# Patient Record
Sex: Female | Born: 1985
Health system: Southern US, Community
[De-identification: ages and names within clinical notes are randomized; demographics above are authoritative.]

## PROBLEM LIST (undated history)

## (undated) DIAGNOSIS — T7840XA Allergy, unspecified, initial encounter: Secondary | ICD-10-CM

## (undated) DIAGNOSIS — N200 Calculus of kidney: Secondary | ICD-10-CM

## (undated) DIAGNOSIS — J40 Bronchitis, not specified as acute or chronic: Secondary | ICD-10-CM

## (undated) DIAGNOSIS — K219 Gastro-esophageal reflux disease without esophagitis: Secondary | ICD-10-CM

## (undated) DIAGNOSIS — E559 Vitamin D deficiency, unspecified: Secondary | ICD-10-CM

## (undated) DIAGNOSIS — G43909 Migraine, unspecified, not intractable, without status migrainosus: Secondary | ICD-10-CM

## (undated) DIAGNOSIS — L661 Lichen planopilaris, unspecified: Secondary | ICD-10-CM

## (undated) DIAGNOSIS — R7303 Prediabetes: Secondary | ICD-10-CM

## (undated) DIAGNOSIS — Z87442 Personal history of urinary calculi: Secondary | ICD-10-CM

## (undated) DIAGNOSIS — E669 Obesity, unspecified: Secondary | ICD-10-CM

## (undated) DIAGNOSIS — I10 Essential (primary) hypertension: Secondary | ICD-10-CM

## (undated) DIAGNOSIS — G4730 Sleep apnea, unspecified: Secondary | ICD-10-CM

## (undated) DIAGNOSIS — J189 Pneumonia, unspecified organism: Secondary | ICD-10-CM

## (undated) DIAGNOSIS — K5792 Diverticulitis of intestine, part unspecified, without perforation or abscess without bleeding: Secondary | ICD-10-CM

## (undated) DIAGNOSIS — G4733 Obstructive sleep apnea (adult) (pediatric): Secondary | ICD-10-CM

## (undated) HISTORY — DX: Allergy, unspecified, initial encounter: T78.40XA

## (undated) HISTORY — DX: Prediabetes: R73.03

## (undated) HISTORY — DX: Migraine, unspecified, not intractable, without status migrainosus: G43.909

## (undated) HISTORY — DX: Calculus of kidney: N20.0

## (undated) HISTORY — DX: Obstructive sleep apnea (adult) (pediatric): G47.33

## (undated) HISTORY — DX: Diverticulitis of intestine, part unspecified, without perforation or abscess without bleeding: K57.92

## (undated) HISTORY — DX: Gastro-esophageal reflux disease without esophagitis: K21.9

## (undated) HISTORY — DX: Essential (primary) hypertension: I10

## (undated) HISTORY — DX: Lichen planopilaris, unspecified: L66.10

## (undated) HISTORY — DX: Lichen planopilaris: L66.1

## (undated) HISTORY — DX: Sleep apnea, unspecified: G47.30

## (undated) HISTORY — DX: Obesity, unspecified: E66.9

## (undated) HISTORY — DX: Vitamin D deficiency, unspecified: E55.9

---
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
## 2004-06-19 ENCOUNTER — Ambulatory Visit (HOSPITAL_COMMUNITY): Admission: RE | Admit: 2004-06-19 | Discharge: 2004-06-19 | Payer: Self-pay | Admitting: Obstetrics and Gynecology

## 2005-11-07 ENCOUNTER — Ambulatory Visit: Payer: Self-pay | Admitting: *Deleted

## 2005-11-07 ENCOUNTER — Inpatient Hospital Stay (HOSPITAL_COMMUNITY): Admission: AD | Admit: 2005-11-07 | Discharge: 2005-11-15 | Payer: Self-pay | Admitting: Gynecology

## 2005-11-09 ENCOUNTER — Ambulatory Visit: Payer: Self-pay | Admitting: Pediatrics

## 2005-11-12 ENCOUNTER — Encounter (INDEPENDENT_AMBULATORY_CARE_PROVIDER_SITE_OTHER): Payer: Self-pay | Admitting: *Deleted

## 2005-11-16 ENCOUNTER — Inpatient Hospital Stay (HOSPITAL_COMMUNITY): Admission: AD | Admit: 2005-11-16 | Discharge: 2005-11-16 | Payer: Self-pay | Admitting: Obstetrics and Gynecology

## 2006-10-01 ENCOUNTER — Emergency Department (HOSPITAL_COMMUNITY): Admission: EM | Admit: 2006-10-01 | Discharge: 2006-10-01 | Payer: Self-pay | Admitting: Emergency Medicine

## 2006-11-24 ENCOUNTER — Other Ambulatory Visit: Admission: RE | Admit: 2006-11-24 | Discharge: 2006-11-24 | Payer: Self-pay | Admitting: Obstetrics and Gynecology

## 2008-02-04 ENCOUNTER — Emergency Department (HOSPITAL_COMMUNITY): Admission: EM | Admit: 2008-02-04 | Discharge: 2008-02-04 | Payer: Self-pay | Admitting: Emergency Medicine

## 2008-08-23 ENCOUNTER — Other Ambulatory Visit: Admission: RE | Admit: 2008-08-23 | Discharge: 2008-08-23 | Payer: Self-pay | Admitting: Obstetrics and Gynecology

## 2009-11-07 ENCOUNTER — Other Ambulatory Visit: Admission: RE | Admit: 2009-11-07 | Discharge: 2009-11-07 | Payer: Self-pay | Admitting: Obstetrics and Gynecology

## 2010-02-08 ENCOUNTER — Emergency Department (HOSPITAL_COMMUNITY): Admission: EM | Admit: 2010-02-08 | Discharge: 2010-02-08 | Payer: Self-pay | Admitting: Family Medicine

## 2010-06-18 LAB — POCT RAPID STREP A (OFFICE): Streptococcus, Group A Screen (Direct): NEGATIVE

## 2010-06-24 ENCOUNTER — Emergency Department (HOSPITAL_COMMUNITY)
Admission: EM | Admit: 2010-06-24 | Discharge: 2010-06-24 | Disposition: A | Discharge status: Home / Self Care | Payer: 59 | Attending: Emergency Medicine | Admitting: Emergency Medicine

## 2010-06-24 DIAGNOSIS — R112 Nausea with vomiting, unspecified: Secondary | ICD-10-CM | POA: Insufficient documentation

## 2010-06-24 DIAGNOSIS — R197 Diarrhea, unspecified: Secondary | ICD-10-CM | POA: Insufficient documentation

## 2010-06-24 DIAGNOSIS — K5289 Other specified noninfective gastroenteritis and colitis: Secondary | ICD-10-CM | POA: Insufficient documentation

## 2010-08-23 NOTE — Op Note (Signed)
[place]. [facility]  Patient:    Debra Parker, Debra Parker Visit Number: [id number] MRN: [id number]          Service Type: DSU Location: [facility] Attending Physician:  Chapman Moss Dictated by:   Teena Irani. Odis Luster, M.D. Proc. Date: 03/26/01                             Operative Report  PREOPERATIVE DIAGNOSIS:  Significant iatrogenic scar of the upper abdomen secondary to a medical procedure.  POSTOPERATIVE DIAGNOSES: 1. Significant iatrogenic scar of the upper abdomen secondary to a medical    procedure. 2. Complicated open wound of the abdomen, 12.5 cm.  PROCEDURE:  Complex wound closure, abdomen, 12.5 cm.  SURGEON:  Teena Irani. Odis Luster, M.D.  ANESTHESIA:  General with topical 0.25% Marcaine with epinephrine.  CLINICAL NOTE:  A 26 year old woman had an exploratory of the subcutaneous tissues of her abdomen for a very large lesion.  This apparently ended up being benign, but she was left with a significant scar of her abdomen.  She presents today for a scar revision.  The nature of the procedure and the risks and the probability that she will still have some spreading of her scar has been discussed with her in detail, and she understood all of this and her parents understood all of this and wished to proceed.  DESCRIPTION OF PROCEDURE:  The patient was n the operating room and placed supine.  After successful induction of general anesthesia, she was prepped with Betadine and draped with sterile drapes.  Marcaine 0.25% was infiltrated around the scar and the scar was marked for excision, extending just to the left of the umbilicus in order to remove the entire area.  The excision was performed, meticulous hemostasis with electrocautery.  Wound edges undermined.  The wound irrigated thoroughly with saline.  Excellent hemostasis having been confirmed, a layered closure with 3-0 Vicryl interrupted inverted deep sutures, 3-0 Vicryl interrupted inverted deep  dermal sutures, and a running 3-0 Prolene subcuticular suture.  Steri-Strips, dry sterile dressing with an ABD pad and an abdominal binder placed over this were applied, and she tolerated the procedure well.  DISPOSITION:  The patient will leave all the dressings in place for two days and then may shower.  She will continue to use the abdominal binder.  We will recheck her in the office on January 2 or sooner if there are questions or concerns.  No lifting, no vigorous activities.  MEDICATIONS:  Darvocet-N 100 a total of 12 given, one p.o. q.6h. for pain; and Keflex 250 mg p.o. q.i.d. Dictated by:   Teena Irani. Odis Luster, M.D. Attending Physician:  Chapman Moss DD:  03/26/01 TD:  03/27/01 Job: [id number]

## 2010-08-23 NOTE — Op Note (Signed)
Debra Parker, Debra Parker             ACCOUNT NO.:  [id number]   MEDICAL RECORD NO.:  [id number]          PATIENT TYPE:  INP   LOCATION:  9372                          [facility]ILITY:  [facility]   PHYSICIAN:  Tracy L. Mayford Knife, M.D.DATE OF BIRTH:  06/18/1985   DATE OF PROCEDURE:  11/12/2005  DATE OF DISCHARGE:                                 OPERATIVE REPORT   PREOPERATIVE DIAGNOSES:  1. Severe preeclampsia defined by anuria.  2. Intrauterine pregnancy 24 weeks and five days.   POSTOPERATIVE DIAGNOSES:  1. Severe preeclampsia defined by anuria.  2. Intrauterine pregnancy 24 weeks and five days.   OPERATION/PROCEDURE:  Primary Classical cesarean section.   SURGEON:  Ginger Carne, M.D.   ASSISTANT:  Marc Morgans. Mayford Knife, M.D.   ESTIMATED BLOOD LOSS:  600 mg.   IV FLUIDS:  1600 mL lactated Ringer's.   URINARY OUTPUT:  45 mL of clear urine.   ANESTHESIA:  Spinal.   SPECIMENS:  Placenta to pathology and cord blood.   FINDINGS:  Preterm, viable female delivered via full, __________ .  Apgars 3  and 7.  Cord pH 7.18.  amniotic fluid was clear.  Placenta had a three-  vessel cord.  Normal uterus, tubes and ovaries.   DESCRIPTION OF PROCEDURE:  Spinal anesthesia was placed and found to be  adequate.  The patient was prepped and draped in the normal sterile fashion  in the dorsal supine position with a leftward tilt.  A Pfannenstiel skin  incision and the abdomen opened.  The uterine segment was incised quickly.  Placenta was found to be inferior and delivered simultaneously with delivery  of the infant which was done via full breech extraction.  Cord was clamped  and cut.  Baby was bulb suctioned and handed off to the awaiting  neonatologist.  Uterus was inspected and cleared of all  clot and debris.  The uterus was closed in a single layer using 0 Vicryl in a running locked  suture.  Bleeding points were hemostatically checked.  The peritoneum was  closed with 0 Vicryl in a running fashion  and 0  Vicryl was then used to close the fascia in a running fashion.  Instrument  and sponge counts were correct x2.  The patient did receive 2 g of Ancef at  cord clamp.  The patient tolerated the procedure well and was returned to  the recovery room in stable condition.           ______________________________  Marc Morgans Mayford Knife, M.D.     TLW/MEDQ  D:  11/12/2005  T:  11/13/2005  Job:  [id number]

## 2010-08-23 NOTE — Discharge Summary (Signed)
[name], Debra Parker NO.:  [id number]   MEDICAL RECORD NO.:  [id number]          PATIENT TYPE:  MAT   LOCATION:  MATC                          [facility]ILITY:  [facility]   PHYSICIAN:  Levander Campion, M.D.  DATE OF BIRTH:  Nov 19, 1985   DATE OF ADMISSION:  11/07/2005  DATE OF DISCHARGE:  11/15/2005                                 DISCHARGE SUMMARY   DISCHARGE DIAGNOSIS:  1. Preeclampsia secondary to anuria.  2. 24-5/7 weeks intrauterine pregnancy.  3. Delivery of viable preterm female at 24-5/7.  4. Primary low vertical cesarean section.   DISCHARGE MEDICATIONS:  1. Ibuprofen 600 mg p.o. q.6 h. p.r.n. pain.  2. Percocet 5/325, 1-2 tabs p.o. q.4-6h. p.r.n. pain.  3. Yasmin OCP.   FOLLOW UP:  The patient is to follow up in two days on Monday, August 13th  in the [facility] for staple removal.  The  patient is to follow up in one week with Dr. Emelda Fear at [facility]  for a blood pressure check and the patient is to follow up in 6 weeks with  Dr. Emelda Fear for a postpartum check.   DISCHARGE INSTRUCTIONS:  No heavy lifting and pelvic rest x6 weeks.   HOSPITAL COURSE:  Ms. Debra Parker is a 25 year old G1, P0 who presented at 85-  1/[redacted] weeks gestational age to be evaluated at [facility] for  hypertension.  Her blood pressure on admission was noted to be 160/90.  She  had no eclampsic symptoms.  She was also noted at [facility] to have  nonreassuring fetal heart tones.  She was transferred to [facility].  Pregnancy induced hypertension labs were drawn and were as follows:  A 24  hour urine protein was 6.337 gm, a uric acid was 7.9, H&H was 32.0/11.0,  platelets are 165,000, creatinine was 0.7, LFTs were within normal limits,  blood pressure at [facility] during this admission ranged from the  130s to 140s over 60s to 80s.  An ultrasound performed on August 3rd showed  a vertex fetus that was 505 gm.  The patient  continued to deny symptoms of  preeclampsia at [facility].  Patient was given 2 doses of  betamethasone.  Her course of betamethasone was complete on November 10, 2005.  The patient continued to have 1-2 minutes severe variable decelerations to  the 60s intermittently.  Maternal Fetal Medicine was consulted and  recommended b.i.d. BPPs, continuous external fetal heart rate monitoring and  daily PIH labs.  Her PIH labs were stable with the exception of  platelets  which decreased to a nadir of 122.  A repeat 24 hour urine showed greater  than 10 gm of protein.  The patient's blood pressure continued to be within  normal limits.  She continued to have severe variable D-cells  intermittently.  The risks and benefits of close monitoring versus C-section  to both the mother and the baby were discussed.  Also the NICU discussed the  possibility of delivery with the patient.  The patient became oliguric and  then anuric on November 12, 2005 and  a primary low vertical cesarean section  was performed secondary to severe preeclampsia and nonreassuring fetal heart  tones.  The patient delivered a viable female infant with Apgar's of 3 and  7.  The baby was taken to the NICU, please see the op note for details.  The  patient was on magnesium following her C-section for 24 hours.  Her blood  pressures postop were stable.  She had good urine output and no symptoms of  preeclampsia.  The baby was doing well in the NICU.  The patient is to  follow up with Dr. Emelda Fear in one week for a blood pressure check and 6  weeks for a postpartum check.  Patient is to have her staples out in 2 days  at the [facility] Admission.           ______________________________  Levander Campion, M.D.     JH/MEDQ  D:  11/15/2005  T:  11/17/2005  Job:  [id number]   cc:   Tilda Burrow, M.D.  Fax: [phone]

## 2011-01-06 LAB — CBC
Hemoglobin: 12.1
MCV: 85.4
RBC: 4.15

## 2011-01-06 LAB — BASIC METABOLIC PANEL
BUN: 13
CO2: 26
Chloride: 107
Creatinine, Ser: 0.55
GFR calc Af Amer: >60
Potassium: 4.1
Sodium: 136

## 2011-01-06 LAB — DIFFERENTIAL
Basophils Relative: 1
Eosinophils Absolute: 0.1
Monocytes Relative: 7

## 2011-01-06 LAB — SEDIMENTATION RATE: Sed Rate: 12

## 2011-01-22 LAB — POCT RAPID STREP A: Streptococcus, Group A Screen (Direct): POSITIVE — AB

## 2012-07-20 ENCOUNTER — Other Ambulatory Visit: Payer: Self-pay

## 2013-01-16 ENCOUNTER — Encounter (HOSPITAL_COMMUNITY): Payer: Self-pay | Admitting: Emergency Medicine

## 2013-01-16 ENCOUNTER — Emergency Department (HOSPITAL_COMMUNITY): Payer: PRIVATE HEALTH INSURANCE

## 2013-01-16 ENCOUNTER — Emergency Department (HOSPITAL_COMMUNITY)
Admission: EM | Admit: 2013-01-16 | Discharge: 2013-01-16 | Disposition: A | Discharge status: Home / Self Care | Payer: PRIVATE HEALTH INSURANCE | Attending: Emergency Medicine | Admitting: Emergency Medicine

## 2013-01-16 DIAGNOSIS — S93409A Sprain of unspecified ligament of unspecified ankle, initial encounter: Secondary | ICD-10-CM | POA: Insufficient documentation

## 2013-01-16 DIAGNOSIS — X500XXA Overexertion from strenuous movement or load, initial encounter: Secondary | ICD-10-CM | POA: Insufficient documentation

## 2013-01-16 DIAGNOSIS — S93402A Sprain of unspecified ligament of left ankle, initial encounter: Secondary | ICD-10-CM

## 2013-01-16 DIAGNOSIS — Y9301 Activity, walking, marching and hiking: Secondary | ICD-10-CM | POA: Insufficient documentation

## 2013-01-16 DIAGNOSIS — Z8709 Personal history of other diseases of the respiratory system: Secondary | ICD-10-CM | POA: Insufficient documentation

## 2013-01-16 DIAGNOSIS — Y929 Unspecified place or not applicable: Secondary | ICD-10-CM | POA: Insufficient documentation

## 2013-01-16 HISTORY — DX: Bronchitis, not specified as acute or chronic: J40

## 2013-01-16 MED ORDER — NAPROXEN 500 MG PO TABS: 500.0000 mg | ORAL_TABLET | Freq: Two times a day (BID) | ORAL | Status: DC

## 2013-01-16 NOTE — ED Provider Notes (Signed)
CSN: [id number]     Arrival date & time 01/16/13  1930 History  This chart was scribed for Rhea Bleacher, PA, working with Geoffery Lyons, MD by Blanchard Kelch, ED Scribe. This patient was seen in room TR08C/TR08C and the patient's care was started at 7:36 PM.    Chief Complaint  Patient presents with  . Foot Injury    The history is provided by the patient. No language interpreter was used.    HPI Comments: Debra Parker is a 27 y.o. female who presents to the Emergency Department due to a left foot injury that occurred this morning about twelve hours ago when she stepped in a hole and twisted her foot. She is complaining of constant pain to the associated area that began suddenly after the injury. The pain is worsened by walking. She wrapped it in an ace bandage and took ibuprofen with mild relief. She denies any pain in her left knee.    Past Medical History  Diagnosis Date  . Bronchitis    Past Surgical History  Procedure Laterality Date  . Cesarean section    . Wisdom tooth extraction     No family history on file. History  Substance Use Topics  . Smoking status: Never Smoker   . Smokeless tobacco: Not on file  . Alcohol Use: No   OB History   Grav Para Term Preterm Abortions TAB SAB Ect Mult Living                 Review of Systems  Constitutional: Negative for activity change.  Musculoskeletal: Positive for arthralgias and myalgias (left foot pain). Negative for back pain, joint swelling and neck pain.       Negative for left knee pain.  Skin: Negative for wound.  Neurological: Negative for weakness and numbness.    Allergies  Sulfa antibiotics  Home Medications   Current Outpatient Rx  Name  Route  Sig  Dispense  Refill  . phentermine 37.5 MG capsule   Oral   Take 37.5 mg by mouth every morning.          Triage Vitals: BP 117/77  Pulse 96  Temp(Src) 97.2 F (36.2 C) (Oral)  Resp 16  SpO2 96%  Physical Exam  Nursing note and vitals  reviewed. Constitutional: She appears well-developed and well-nourished.  HENT:  Head: Normocephalic and atraumatic.  Eyes: Conjunctivae are normal.  Neck: Normal range of motion. Neck supple.  Cardiovascular:  Pulses:      Dorsalis pedis pulses are 2+ on the right side, and 2+ on the left side.       Posterior tibial pulses are 2+ on the right side, and 2+ on the left side.  Musculoskeletal: She exhibits edema and tenderness.  Patient complains of pain with palpation of the lateral left ankle. She denies pain with palpation over the fibular head of the affected side. She denies pain in the hip of the affected side.  Neurological: She is alert.  Distal motor, sensation, and vascular intact.   Skin: Skin is warm and dry.  Psychiatric: She has a normal mood and affect.    ED Course  Procedures (including critical care time)  DIAGNOSTIC STUDIES: Oxygen Saturation is 96% on room air, adequate by my interpretation.    COORDINATION OF CARE: 7:47 PM -Will order left ankle x-ray. Patient verbalizes understanding and agrees with treatment plan.  Labs Review Labs Reviewed - No data to display Imaging Review Dg Ankle Complete Left  01/16/2013  CLINICAL DATA:  Left ankle pain and swelling after injury.  EXAM: LEFT ANKLE COMPLETE - 3+ VIEW  COMPARISON:  None.  FINDINGS: There is no evidence of fracture, dislocation, or joint effusion. There is no evidence of arthropathy or other focal bone abnormality. Soft tissues are unremarkable.  IMPRESSION: Normal left ankle.   Electronically Signed   By: Roque Lias M.D.   On: 01/16/2013 20:41    EKG Interpretation   None      Vital signs reviewed and are as follows: Filed Vitals:   01/16/13 1942  BP: 117/77  Pulse: 96  Temp: 97.2 F (36.2 C)  Resp: 16   Patient informed of x-ray results. ASO by orthopedic tech. Patient declines crutches.  Patient was counseled on RICE protocol and told to rest injury, use ice for no longer than 15  minutes every hour, compress the area, and elevate above the level of their heart as much as possible to reduce swelling.  Questions answered.  Patient verbalized understanding.     MDM   1. Ankle sprain, left, initial encounter    Patient with ankle injury, negative x-ray. Suspect ankle sprain. Rice precautions NSAIDs indicated with orthopedic followup if not improved in one week. Foot is neurovascularly intact.  I personally performed the services described in this documentation, which was scribed in my presence. The recorded information has been reviewed and is accurate.    Renne Crigler, PA-C 01/16/13 2352

## 2013-01-16 NOTE — Progress Notes (Signed)
Orthopedic Tech Progress Note Patient Details:  Debra Parker Feb 13, 1986 [id number]  Ortho Devices Type of Ortho Device: ASO Ortho Device/Splint Location: lle Ortho Device/Splint Interventions: Application   [name] 01/16/2013, 9:14 PM

## 2013-01-16 NOTE — ED Notes (Signed)
Pt st's she was walking this am and stepped in a hole.   C/O pain to lat. Aspect of left foot.  Swelling present

## 2013-01-18 NOTE — ED Provider Notes (Signed)
Medical screening examination/treatment/procedure(s) were performed by non-physician practitioner and as supervising physician I was immediately available for consultation/collaboration.  Geoffery Lyons, MD 01/18/13 [phone]

## 2013-04-18 ENCOUNTER — Other Ambulatory Visit (HOSPITAL_COMMUNITY): Payer: Self-pay | Admitting: Family Medicine

## 2013-04-18 ENCOUNTER — Ambulatory Visit (HOSPITAL_COMMUNITY)
Admission: RE | Admit: 2013-04-18 | Discharge: 2013-04-18 | Disposition: A | Discharge status: Home / Self Care | Payer: 59 | Source: Ambulatory Visit | Attending: Family Medicine | Admitting: Family Medicine

## 2013-04-18 DIAGNOSIS — R509 Fever, unspecified: Secondary | ICD-10-CM

## 2013-04-18 DIAGNOSIS — R05 Cough: Secondary | ICD-10-CM

## 2013-04-18 DIAGNOSIS — R0600 Dyspnea, unspecified: Secondary | ICD-10-CM

## 2013-04-18 DIAGNOSIS — R0989 Other specified symptoms and signs involving the circulatory and respiratory systems: Secondary | ICD-10-CM | POA: Insufficient documentation

## 2013-04-18 DIAGNOSIS — R059 Cough, unspecified: Secondary | ICD-10-CM

## 2014-01-06 ENCOUNTER — Telehealth: Payer: Self-pay | Admitting: Family Medicine

## 2014-01-06 NOTE — Telephone Encounter (Signed)
OK 

## 2014-01-06 NOTE — Telephone Encounter (Signed)
Pt's mom and dad are pt's of Dr Caryl NeverBurchette and would like for you to see their daughter. Can leave a message on moms cell.

## 2014-01-10 ENCOUNTER — Ambulatory Visit (HOSPITAL_COMMUNITY)
Admission: RE | Admit: 2014-01-10 | Discharge: 2014-01-10 | Disposition: A | Discharge status: Home / Self Care | Payer: 59 | Source: Ambulatory Visit | Attending: Family Medicine | Admitting: Family Medicine

## 2014-01-10 ENCOUNTER — Other Ambulatory Visit (HOSPITAL_COMMUNITY): Payer: Self-pay | Admitting: Family Medicine

## 2014-01-10 DIAGNOSIS — R05 Cough: Secondary | ICD-10-CM

## 2014-01-10 DIAGNOSIS — R059 Cough, unspecified: Secondary | ICD-10-CM

## 2014-11-29 ENCOUNTER — Emergency Department (HOSPITAL_COMMUNITY)
Admission: EM | Admit: 2014-11-29 | Discharge: 2014-11-29 | Disposition: A | Discharge status: Home / Self Care | Payer: BLUE CROSS/BLUE SHIELD | Attending: Emergency Medicine | Admitting: Emergency Medicine

## 2014-11-29 ENCOUNTER — Encounter (HOSPITAL_COMMUNITY): Payer: Self-pay | Admitting: Emergency Medicine

## 2014-11-29 DIAGNOSIS — R21 Rash and other nonspecific skin eruption: Secondary | ICD-10-CM | POA: Diagnosis present

## 2014-11-29 DIAGNOSIS — Z791 Long term (current) use of non-steroidal anti-inflammatories (NSAID): Secondary | ICD-10-CM | POA: Diagnosis not present

## 2014-11-29 DIAGNOSIS — Z8709 Personal history of other diseases of the respiratory system: Secondary | ICD-10-CM | POA: Insufficient documentation

## 2014-11-29 DIAGNOSIS — L739 Follicular disorder, unspecified: Secondary | ICD-10-CM

## 2014-11-29 MED ORDER — PREDNISONE 50 MG PO TABS
60.0000 mg | ORAL_TABLET | Freq: Once | ORAL | Status: AC
  Administered 2014-11-29: 60 mg via ORAL
  Filled 2014-11-29 (×2): qty 1

## 2014-11-29 MED ORDER — HYDROXYZINE HCL 25 MG PO TABS
25.0000 mg | ORAL_TABLET | Freq: Once | ORAL | Status: AC
  Administered 2014-11-29: 25 mg via ORAL
  Filled 2014-11-29: qty 1

## 2014-11-29 MED ORDER — ERYTHROMYCIN 2 % EX OINT: TOPICAL_OINTMENT | CUTANEOUS | Status: DC

## 2014-11-29 MED ORDER — CEPHALEXIN 500 MG PO CAPS
500.0000 mg | ORAL_CAPSULE | Freq: Once | ORAL | Status: AC
  Administered 2014-11-29: 500 mg via ORAL
  Filled 2014-11-29: qty 1

## 2014-11-29 MED ORDER — CEPHALEXIN 500 MG PO CAPS: 500.0000 mg | ORAL_CAPSULE | Freq: Four times a day (QID) | ORAL | Status: DC

## 2014-11-29 MED ORDER — HYDROXYZINE HCL 25 MG PO TABS: ORAL_TABLET | ORAL | Status: DC

## 2014-11-29 NOTE — Discharge Instructions (Signed)
Your vital signs are well within normal limits. Your examination suggest folliculitis. Please apply erythromycin ointment to the area 2 times daily over the next 7-10 days. Please use Keflex 4 times daily with food until all taken. May use Vistaril every 6 hours if needed for itching. This medication may cause drowsiness, please do not drive, drink alcohol, frequency and agree, or participate in activities requiring concentration when taking this medication. Folliculitis  Folliculitis is redness, soreness, and swelling (inflammation) of the hair follicles. This condition can occur anywhere on the body. People with weakened immune systems, diabetes, or obesity have a greater risk of getting folliculitis. CAUSES  Bacterial infection. This is the most common cause.  Fungal infection.  Viral infection.  Contact with certain chemicals, especially oils and tars. Long-term folliculitis can result from bacteria that live in the nostrils. The bacteria may trigger multiple outbreaks of folliculitis over time. SYMPTOMS Folliculitis most commonly occurs on the scalp, thighs, legs, back, buttocks, and areas where hair is shaved frequently. An early sign of folliculitis is a small, white or yellow, pus-filled, itchy lesion (pustule). These lesions appear on a red, inflamed follicle. They are usually less than 0.2 inches (5 mm) wide. When there is an infection of the follicle that goes deeper, it becomes a boil or furuncle. A group of closely packed boils creates a larger lesion (carbuncle). Carbuncles tend to occur in hairy, sweaty areas of the body. DIAGNOSIS  Your caregiver can usually tell what is wrong by doing a physical exam. A sample may be taken from one of the lesions and tested in a lab. This can help determine what is causing your folliculitis. TREATMENT  Treatment may include:  Applying warm compresses to the affected areas.  Taking antibiotic medicines orally or applying them to the  skin.  Draining the lesions if they contain a large amount of pus or fluid.  Laser hair removal for cases of long-lasting folliculitis. This helps to prevent regrowth of the hair. HOME CARE INSTRUCTIONS  Apply warm compresses to the affected areas as directed by your caregiver.  If antibiotics are prescribed, take them as directed. Finish them even if you start to feel better.  You may take over-the-counter medicines to relieve itching.  Do not shave irritated skin.  Follow up with your caregiver as directed. SEEK IMMEDIATE MEDICAL CARE IF:   You have increasing redness, swelling, or pain in the affected area.  You have a fever. MAKE SURE YOU:  Understand these instructions.  Will watch your condition.  Will get help right away if you are not doing well or get worse. Document Released: 06/02/2001 Document Revised: 09/23/2011 Document Reviewed: 06/24/2011 [facility] Patient Information 2015 [place], [place]. This information is not intended to replace advice given to you by your health care provider. Make sure you discuss any questions you have with your health care provider.

## 2014-11-29 NOTE — ED Notes (Signed)
Pt reports bilateral calf rash since Thursday. Pt reports just returned from [place] on Saturday.  Pt reports has tried otc products with no relief. Pt denies any new symptoms. nad noted. Moderate rash noted to both legs. Airway patent.

## 2014-11-29 NOTE — ED Provider Notes (Signed)
CSN: [id number]     Arrival date & time 11/29/14  1709 History   First MD Initiated Contact with Patient 11/29/14 1833     Chief Complaint  Patient presents with  . Rash     (Consider location/radiation/quality/duration/timing/severity/associated sxs/prior Treatment) HPI Comments: Patient is a 29 year old female who presents to the emergency department with a rash on the calf and lower leg. The patient states that she was in [place] about one week ago when she began to notice a rash on the calf area. Problem.progressively worse. And upon her return here in [place] she notes that the conservative measures that she's been trying have not been effective in relieving this rash. She states that the area was warm to touch on Friday or Saturday, but is not warm now. She does state that she has a lot of itching of the area. She is also concerned because she sustained an insect bite that she believes to be a mosquito while in the [place] area. She has not had high fever, but has been no vomiting reported. She has not had this rash before. She denies any new soaps, new clothing, new lotions. She does report that she shaved her legs frequently while she was in the [place] area.  Patient is a 29 y.o. female presenting with rash. The history is provided by the patient.  Rash   Past Medical History  Diagnosis Date  . Bronchitis    Past Surgical History  Procedure Laterality Date  . Cesarean section    . Wisdom tooth extraction     History reviewed. No pertinent family history. Social History  Substance Use Topics  . Smoking status: Never Smoker   . Smokeless tobacco: None  . Alcohol Use: No   OB History    No data available     Review of Systems  Skin: Positive for rash.  All other systems reviewed and are negative.     Allergies  Sulfa antibiotics  Home Medications   Prior to Admission medications   Medication Sig Start Date End Date Taking? Authorizing Provider  naproxen  (NAPROSYN) 500 MG tablet Take 1 tablet (500 mg total) by mouth 2 (two) times daily. 01/16/13   Renne Crigler, PA-C  phentermine 37.5 MG capsule Take 37.5 mg by mouth every morning.    Historical Provider, MD   BP 133/80 mmHg  Pulse 85  Temp(Src) 97.9 F (36.6 C) (Oral)  Resp 18  Ht 5\' 6"  (1.676 m)  Wt 200 lb (90.719 kg)  BMI 32.30 kg/m2  SpO2 100% Physical Exam  Constitutional: She is oriented to person, place, and time. She appears well-developed and well-nourished.  Non-toxic appearance.  HENT:  Head: Normocephalic.  Right Ear: Tympanic membrane and external ear normal.  Left Ear: Tympanic membrane and external ear normal.  Eyes: EOM and lids are normal. Pupils are equal, round, and reactive to light.  Neck: Normal range of motion. Neck supple. Carotid bruit is not present.  Cardiovascular: Normal rate, regular rhythm, normal heart sounds, intact distal pulses and normal pulses.   Pulmonary/Chest: Breath sounds normal. No respiratory distress. She has no wheezes. She has no rales.  Patient speaks in complete sentences without problem.  Abdominal: Soft. Bowel sounds are normal. There is no tenderness. There is no guarding.  Musculoskeletal: Normal range of motion.  Lymphadenopathy:       Head (right side): No submandibular adenopathy present.       Head (left side): No submandibular adenopathy present.  She has no cervical adenopathy.  Neurological: She is alert and oriented to person, place, and time. She has normal strength. No cranial nerve deficit or sensory deficit.  Skin: Skin is warm and dry.  There is a red macular rash from the mid calf to the ankle bilaterally. The area is not hot. There no red streaks appreciated. There is no weeping noted on. There no pustules appreciated. There is a small area on the right thigh per the patient, but no other rash area appreciated.  Psychiatric: She has a normal mood and affect. Her speech is normal.  Nursing note and vitals  reviewed.   ED Course  Procedures (including critical care time) Labs Review Labs Reviewed - No data to display  Imaging Review No results found. I have personally reviewed and evaluated these images and lab results as part of my medical decision-making.   EKG Interpretation None      MDM  Vital signs are well within normal limits. The rash and the history are consistent with a folliculitis. The patient will be treated with erythromycin liquid, and Keflex orally. The patient will be treated with a short course of steroid-induced to assist with the itching.    Final diagnoses:  None    *I have reviewed nursing notes, vital signs, and all appropriate lab and imaging results for this patient.[address], PA-C 11/29/14 1858  Rolland Porter, MD 12/03/14 [phone]

## 2015-09-11 ENCOUNTER — Encounter: Payer: Self-pay | Admitting: Orthopaedic Surgery

## 2015-09-11 ENCOUNTER — Ambulatory Visit (INDEPENDENT_AMBULATORY_CARE_PROVIDER_SITE_OTHER): Payer: PRIVATE HEALTH INSURANCE | Admitting: Orthopaedic Surgery

## 2015-09-11 VITALS — BP 108/67 | HR 78 | Temp 98.1°F | Ht 65.0 in | Wt 259.0 lb

## 2015-09-11 DIAGNOSIS — M7711 Lateral epicondylitis, right elbow: Secondary | ICD-10-CM

## 2015-09-11 MED ORDER — NAPROXEN 500 MG PO TABS: 500.0000 mg | ORAL_TABLET | Freq: Two times a day (BID) | ORAL | Status: DC

## 2015-09-11 NOTE — Progress Notes (Signed)
   Subjective: My right elbow hurts    Patient ID: Debra RamusHeather M Anspach, female    DOB: 05-Sep-1985, 30 y.o.   MRN: [id number]  HPI She has had right lateral elbow pain for over a month.  It hurts to lift and hold anything.  She has no redness, no swelling, no trauma, no numbness.  It is getting worse. She has used heat and bought an elbow brace which has not helped.  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  She is right hand dominant.   Review of Systems  HENT: Negative for congestion.   Respiratory: Negative for cough and shortness of breath.   Cardiovascular: Negative for chest pain and leg swelling.  Endocrine: Negative for cold intolerance.  Musculoskeletal: Positive for arthralgias.  Allergic/Immunologic: Negative for environmental allergies.   Past Medical History  Diagnosis Date  . Bronchitis     Past Surgical History  Procedure Laterality Date  . Cesarean section    . Wisdom tooth extraction      No current outpatient prescriptions on file prior to visit.   No current facility-administered medications on file prior to visit.    Social History   Social History  . Marital Status: Single    Spouse Name: N/A  . Number of Children: N/A  . Years of Education: N/A   Occupational History  . Not on file.   Social History Main Topics  . Smoking status: Never Smoker   . Smokeless tobacco: Not on file  . Alcohol Use: No  . Drug Use: No  . Sexual Activity: Yes    Birth Control/ Protection: IUD   Other Topics Concern  . Not on file   Social History Narrative    BP 108/67 mmHg  Pulse 78  Temp(Src) 98.1 F (36.7 C)  Ht 5\' 5"  (1.651 m)  Wt 259 lb (117.482 kg)  BMI 43.10 kg/m2     Objective:   Physical Exam  Constitutional: She is oriented to person, place, and time. She appears well-developed and well-nourished.  HENT:  Head: Normocephalic and atraumatic.  Eyes: Conjunctivae and EOM are normal. Pupils are equal, round, and reactive to  light.  Neck: Normal range of motion. Neck supple.  Cardiovascular: Normal rate, regular rhythm and intact distal pulses.   Pulmonary/Chest: Effort normal.  Abdominal: Soft.  Musculoskeletal: She exhibits tenderness (She is very tender over the right lateral epicondyle, No redness, no swelling, NV intact.  Pain to resisted dorsiextension of the wrist.  Left side negative.).  Neurological: She is alert and oriented to person, place, and time. She displays normal reflexes. No cranial nerve deficit. She exhibits normal muscle tone. Coordination normal.  Skin: Skin is warm and dry.  Psychiatric: She has a normal mood and affect. Her behavior is normal. Judgment and thought content normal.          Assessment & Plan:   Encounter Diagnosis  Name Primary?  . Lateral epicondylitis, right Yes    I explained ice massage and how to do.  I explained what tennis elbow is.  It is easy to get and hard to get rid of.  I have given Rx for Naprosyn, precautions given.  Return in two weeks.  Call if any problem.  Electronically Signed Darreld McleanWayne Rayette Mogg, MD 6/6/20172:05 PM

## 2015-09-11 NOTE — Patient Instructions (Signed)
Ice massage instructions given.

## 2015-09-25 ENCOUNTER — Ambulatory Visit: Payer: Self-pay | Admitting: Orthopaedic Surgery

## 2015-09-26 ENCOUNTER — Ambulatory Visit (INDEPENDENT_AMBULATORY_CARE_PROVIDER_SITE_OTHER): Payer: PRIVATE HEALTH INSURANCE | Admitting: Orthopaedic Surgery

## 2015-09-26 ENCOUNTER — Encounter: Payer: Self-pay | Admitting: Orthopaedic Surgery

## 2015-09-26 VITALS — BP 108/74 | HR 99 | Temp 97.9°F | Ht 65.0 in | Wt 257.8 lb

## 2015-09-26 DIAGNOSIS — M7711 Lateral epicondylitis, right elbow: Secondary | ICD-10-CM | POA: Diagnosis not present

## 2015-09-26 NOTE — Progress Notes (Signed)
CC:  My elbow hurts more  She has lateral epicondylitis of the right elbow. She has been doing ice massage and taking the Naprosyn. She continues to have pain.  She has tenderness over the lateral epicondyle right with no redness or swelling.  She has pain to resisted dorsiextension.  Encounter Diagnosis  Name Primary?  . Lateral epicondylitis, right Yes    PROCEDURE NOTE  After prep and permission from the patient, the right epicondyle area was injected with 1% Xylocaine and 1 cc DepoMedrol 40 by sterile technique tolerated well.   I will see her in one month.  Precautions given.  Call if any problem.  Continue ice massage and naprosyn.  Electronically Signed Darreld McleanWayne Yuepheng Schaller, MD 6/21/20173:07 PM

## 2015-10-17 ENCOUNTER — Ambulatory Visit: Payer: Self-pay | Admitting: Orthopaedic Surgery

## 2016-04-15 ENCOUNTER — Encounter: Payer: Self-pay | Admitting: Orthopaedic Surgery

## 2016-04-15 ENCOUNTER — Ambulatory Visit (INDEPENDENT_AMBULATORY_CARE_PROVIDER_SITE_OTHER): Payer: PRIVATE HEALTH INSURANCE

## 2016-04-15 ENCOUNTER — Ambulatory Visit (INDEPENDENT_AMBULATORY_CARE_PROVIDER_SITE_OTHER): Payer: PRIVATE HEALTH INSURANCE | Admitting: Orthopaedic Surgery

## 2016-04-15 VITALS — BP 109/63 | HR 83 | Temp 98.1°F | Ht 65.0 in | Wt 268.0 lb

## 2016-04-15 DIAGNOSIS — M25562 Pain in left knee: Secondary | ICD-10-CM | POA: Diagnosis not present

## 2016-04-15 MED ORDER — HYDROCODONE-ACETAMINOPHEN 7.5-325 MG PO TABS: ORAL_TABLET | ORAL | 0 refills | Status: DC

## 2016-04-15 MED ORDER — NAPROXEN 500 MG PO TABS: 500.0000 mg | ORAL_TABLET | Freq: Two times a day (BID) | ORAL | 5 refills | Status: DC

## 2016-04-15 NOTE — Patient Instructions (Signed)

## 2016-04-15 NOTE — Progress Notes (Signed)
Patient Debra Parker, female DOB:1985-09-25, 31 y.o. JXB:[id number]  Chief Complaint  Patient presents with  . new problem    Left knee pain    HPI  Debra Parker is a 31 y.o. female who has developed acute pain of the left knee over the last week. She has has some pain at times of the left ankle but she has no new trauma.  She has swelling and popping of the left knee. She has pain with full extension. She has no locking or giving way.  She has tried heat, ice, Advil with little help.  She has no redness or numbness. HPI  Body mass index is 44.6 kg/m.  ROS  Review of Systems  HENT: Negative for congestion.   Respiratory: Negative for cough and shortness of breath.   Cardiovascular: Negative for chest pain and leg swelling.  Endocrine: Negative for cold intolerance.  Musculoskeletal: Positive for arthralgias.  Allergic/Immunologic: Negative for environmental allergies.    Past Medical History:  Diagnosis Date  . Bronchitis     Past Surgical History:  Procedure Laterality Date  . CESAREAN SECTION    . WISDOM TOOTH EXTRACTION      History reviewed. No pertinent family history.  Social History Social History  Substance Use Topics  . Smoking status: Never Smoker  . Smokeless tobacco: Never Used  . Alcohol use No    Allergies  Allergen Reactions  . Sulfa Antibiotics Other (See Comments)    Not sure.    Current Outpatient Prescriptions  Medication Sig Dispense Refill  . HYDROcodone-acetaminophen (NORCO) 7.5-325 MG tablet One tablet every four hours as needed for pain.  Five day supply for acute pain per [facility]. 30 tablet 0  . naproxen (NAPROSYN) 500 MG tablet Take 1 tablet (500 mg total) by mouth 2 (two) times daily with a meal. 60 tablet 5   No current facility-administered medications for this visit.      Physical Exam  Blood pressure 109/63, pulse 83, temperature 98.1 F (36.7 C), height 5\' 5"  (1.651 m), weight 268 lb (121.6  kg).  Constitutional: overall normal hygiene, normal nutrition, well developed, normal grooming, normal body habitus. Assistive device:none  Musculoskeletal: gait and station Limp left, muscle tone and strength are normal, no tremors or atrophy is present.  .  Neurological: coordination overall normal.  Deep tendon reflex/nerve stretch intact.  Sensation normal.  Cranial nerves II-XII intact.   Skin:   Normal overall no scars, lesions, ulcers or rashes. No psoriasis.  Psychiatric: Alert and oriented x 3.  Recent memory intact, remote memory unclear.  Normal mood and affect. Well groomed.  Good eye contact.  Cardiovascular: overall no swelling, no varicosities, no edema bilaterally, normal temperatures of the legs and arms, no clubbing, cyanosis and good capillary refill.  Lymphatic: palpation is normal.  The left lower extremity is examined:  Inspection:  Thigh:  Non-tender and no defects  Knee has swelling 2+ effusion.                        Joint tenderness is present                        Patient is tender over the medial joint line  Lower Leg:  Has normal appearance and no tenderness or defects  Ankle:  Non-tender and no defects  Foot:  Non-tender and no defects Range of Motion:  Knee:  Range of motion is:  5 to 100                        Crepitus is  present  Ankle:  Range of motion is normal. Strength and Tone:  The left lower extremity has normal strength and tone. Stability:  Knee:  The knee is stable.  Ankle:  The ankle is stable.  X-rays of the left knee were done, reported separately.  The patient has been educated about the nature of the problem(s) and counseled on treatment options.  The patient appeared to understand what I have discussed and is in agreement with it.  Encounter Diagnosis  Name Primary?  . Acute pain of left knee Yes   PROCEDURE NOTE:  The patient request injection, verbal consent was obtained.  The left knee was prepped appropriately  after time out was performed.   Sterile technique was observed and anesthesia was provided by ethyl chloride and a 20-gauge needle was used to inject the knee area.  A 16-gauge needle was then used to aspirate the knee.  Color of fluid aspirated was blood tinged  Total cc's aspirated was 20.    Injection of 1 cc of Depo-Medrol 40 mg with several cc's of plain xylocaine was then performed.  A band aid dressing was applied.  The patient was advised to apply ice later today and tomorrow to the injection sight as needed.  PLAN Call if any problems.  Precautions discussed.  Continue current medications.   Return to clinic 1 week   I have given five day supply of Norco 7.5 after reviewing the 5230-month state narcotic site.  Electronically Signed Darreld McleanWayne Elkin Belfield, MD 1/9/20189:08 AM

## 2016-04-17 ENCOUNTER — Telehealth: Payer: Self-pay | Admitting: Orthopaedic Surgery

## 2016-04-17 NOTE — Telephone Encounter (Signed)
ok 

## 2016-04-17 NOTE — Telephone Encounter (Signed)
Patient saw Dr. Hilda LiasKeeling on Tuesday, 04-15-16.  He drew fluid from her knee at that time.  She states her knee has swollen back up and is painful.  She is using ice.  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  She wants to know if we can give her an out of work note until she returns on the 18th?

## 2016-04-18 ENCOUNTER — Encounter: Payer: Self-pay | Admitting: Orthopedic Surgery

## 2016-04-23 ENCOUNTER — Encounter: Payer: Self-pay | Admitting: Orthopaedic Surgery

## 2016-04-24 ENCOUNTER — Ambulatory Visit: Payer: Self-pay | Admitting: Orthopaedic Surgery

## 2016-04-29 ENCOUNTER — Encounter: Payer: Self-pay | Admitting: Orthopaedic Surgery

## 2016-04-29 ENCOUNTER — Ambulatory Visit (INDEPENDENT_AMBULATORY_CARE_PROVIDER_SITE_OTHER): Payer: PRIVATE HEALTH INSURANCE | Admitting: Orthopaedic Surgery

## 2016-04-29 VITALS — BP 113/68 | HR 70 | Ht 65.0 in | Wt 268.0 lb

## 2016-04-29 DIAGNOSIS — M25562 Pain in left knee: Secondary | ICD-10-CM | POA: Diagnosis not present

## 2016-04-29 NOTE — Patient Instructions (Signed)
Out of work 

## 2016-04-29 NOTE — Progress Notes (Signed)
Patient ZO:XWRUEAV Debra Parker, female DOB:19-Oct-1985, 31 y.o. [id number]:[id number]  Chief Complaint  Patient presents with  . Knee Pain    left    HPI  Debra Parker is a 31 y.o. female who has worsening pain of the left knee now.  She cannot fully extend it.  She has more swelling and pain and popping.  She has medial joint line pain and also giving way.  She hurts all the time she says. She has no redness. She has taken the medicine and used ice with help.   I would like to get a MRI as she has more pain, inability to fully extend, positive medial McMurray and giving way.  I am concerned she has a medial meniscus tear on the left and will need arthroscopy. HPI  Body mass index is 44.6 kg/m.  ROS  Review of Systems  HENT: Negative for congestion.   Respiratory: Negative for cough and shortness of breath.   Cardiovascular: Negative for chest pain and leg swelling.  Endocrine: Negative for cold intolerance.  Musculoskeletal: Positive for arthralgias.  Allergic/Immunologic: Negative for environmental allergies.    Past Medical History:  Diagnosis Date  . Bronchitis     Past Surgical History:  Procedure Laterality Date  . CESAREAN SECTION    . WISDOM TOOTH EXTRACTION      No family history on file.  Social History Social History  Substance Use Topics  . Smoking status: Never Smoker  . Smokeless tobacco: Never Used  . Alcohol use No    Allergies  Allergen Reactions  . Sulfa Antibiotics Other (See Comments)    Not sure.    Current Outpatient Prescriptions  Medication Sig Dispense Refill  . HYDROcodone-acetaminophen (NORCO) 7.5-325 MG tablet One tablet every four hours as needed for pain.  Five day supply for acute pain per [facility]. 30 tablet 0  . naproxen (NAPROSYN) 500 MG tablet Take 1 tablet (500 mg total) by mouth 2 (two) times daily with a meal. 60 tablet 5   No current facility-administered medications for this visit.      Physical Exam  Blood  pressure 113/68, pulse 70, height 5\' 5"  (1.651 m), weight 268 lb (121.6 kg).  Constitutional: overall normal hygiene, normal nutrition, well developed, normal grooming, normal body habitus. Assistive device:none  Musculoskeletal: gait and station Limp left, muscle tone and strength are normal, no tremors or atrophy is present.  .  Neurological: coordination overall normal.  Deep tendon reflex/nerve stretch intact.  Sensation normal.  Cranial nerves II-XII intact.   Skin:   Normal overall no scars, lesions, ulcers or rashes. No psoriasis.  Psychiatric: Alert and oriented x 3.  Recent memory intact, remote memory unclear.  Normal mood and affect. Well groomed.  Good eye contact.  Cardiovascular: overall no swelling, no varicosities, no edema bilaterally, normal temperatures of the legs and arms, no clubbing, cyanosis and good capillary refill.  Lymphatic: palpation is normal.  The left lower extremity is examined:  Inspection:  Thigh:  Non-tender and no defects  Knee has swelling 2+ effusion.                        Joint tenderness is present                        Patient is tender over the medial joint line  Lower Leg:  Has normal appearance and no tenderness or defects  Ankle:  Non-tender  and no defects  Foot:  Non-tender and no defects Range of Motion:  Knee:  Range of motion is: 5 to 90 with pain, unable to be able to fully extend the knee.                        Crepitus is  present  Ankle:  Range of motion is normal. Strength and Tone:  The left lower extremity has normal strength and tone. Stability:  Knee:  The knee has positive medial McMurray.  Ankle:  The ankle is stable.    The patient has been educated about the nature of the problem(s) and counseled on treatment options.  The patient appeared to understand what I have discussed and is in agreement with it.  Encounter Diagnosis  Name Primary?  . Acute pain of left knee Yes    PLAN Call if any problems.   Precautions discussed.  Continue current medications.   Return to clinic get MRI of the left knee.   Electronically Signed Darreld McleanWayne Cace Osorto, MD 1/23/20184:48 PM

## 2016-04-30 ENCOUNTER — Ambulatory Visit: Payer: Self-pay | Admitting: Orthopaedic Surgery

## 2016-05-02 ENCOUNTER — Ambulatory Visit (HOSPITAL_COMMUNITY)
Admission: RE | Admit: 2016-05-02 | Discharge: 2016-05-02 | Disposition: A | Discharge status: Home / Self Care | Payer: PRIVATE HEALTH INSURANCE | Source: Ambulatory Visit | Attending: Orthopaedic Surgery | Admitting: Orthopaedic Surgery

## 2016-05-02 DIAGNOSIS — S83282A Other tear of lateral meniscus, current injury, left knee, initial encounter: Secondary | ICD-10-CM | POA: Diagnosis not present

## 2016-05-02 DIAGNOSIS — X58XXXA Exposure to other specified factors, initial encounter: Secondary | ICD-10-CM | POA: Diagnosis not present

## 2016-05-02 DIAGNOSIS — M25562 Pain in left knee: Secondary | ICD-10-CM

## 2016-05-02 DIAGNOSIS — S80222A Blister (nonthermal), left knee, initial encounter: Secondary | ICD-10-CM | POA: Diagnosis not present

## 2016-05-06 ENCOUNTER — Ambulatory Visit (INDEPENDENT_AMBULATORY_CARE_PROVIDER_SITE_OTHER): Payer: PRIVATE HEALTH INSURANCE | Admitting: Orthopaedic Surgery

## 2016-05-06 ENCOUNTER — Encounter: Payer: Self-pay | Admitting: Orthopaedic Surgery

## 2016-05-06 VITALS — BP 132/88 | HR 75 | Temp 97.7°F | Ht 65.0 in | Wt 266.0 lb

## 2016-05-06 DIAGNOSIS — M25562 Pain in left knee: Secondary | ICD-10-CM

## 2016-05-06 NOTE — Progress Notes (Signed)
Patient ON:Debra Parker VIA ROSADO, female DOB:1985/12/10, 31 y.o. [id number]:[id number]  Chief Complaint  Patient presents with  . Results    MRI Left knee    HPI  Debra Parker is a 31 y.o. female who has had acute pain of the left knee. She had MRI done 05-02-16 showing: IMPRESSION: Tiny inferior articular surfacing tear involving the anteromedial corner of the lateral meniscus.  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  Intact cruciate and collateral ligaments.  I have explained the findings to her.  No surgery is needed at this time. Continue her medicine. HPI  Body mass index is 44.26 kg/m.  ROS  Review of Systems  HENT: Negative for congestion.   Respiratory: Negative for cough and shortness of breath.   Cardiovascular: Negative for chest pain and leg swelling.  Endocrine: Negative for cold intolerance.  Musculoskeletal: Positive for arthralgias.  Allergic/Immunologic: Negative for environmental allergies.    Past Medical History:  Diagnosis Date  . Bronchitis     Past Surgical History:  Procedure Laterality Date  . CESAREAN SECTION    . WISDOM TOOTH EXTRACTION      No family history on file.  Social History Social History  Substance Use Topics  . Smoking status: Never Smoker  . Smokeless tobacco: Never Used  . Alcohol use No    Allergies  Allergen Reactions  . Sulfa Antibiotics Other (See Comments)    Not sure.    Current Outpatient Prescriptions  Medication Sig Dispense Refill  . HYDROcodone-acetaminophen (NORCO) 7.5-325 MG tablet One tablet every four hours as needed for pain.  Five day supply for acute pain per [facility]. 30 tablet 0  . naproxen (NAPROSYN) 500 MG tablet Take 1 tablet (500 mg total) by mouth 2 (two) times daily with a meal. 60 tablet 5   No current facility-administered  medications for this visit.      Physical Exam  Blood pressure 132/88, pulse 75, temperature 97.7 F (36.5 C), height 5\' 5"  (1.651 m), weight 266 lb (120.7 kg).  Constitutional: overall normal hygiene, normal nutrition, well developed, normal grooming, normal body habitus. Assistive device:none  Musculoskeletal: gait and station Limp left, muscle tone and strength are normal, no tremors or atrophy is present.  .  Neurological: coordination overall normal.  Deep tendon reflex/nerve stretch intact.  Sensation normal.  Cranial nerves II-XII intact.   Skin:   Normal overall no scars, lesions, ulcers or rashes. No psoriasis.  Psychiatric: Alert and oriented x 3.  Recent memory intact, remote memory unclear.  Normal mood and affect. Well groomed.  Good eye contact.  Cardiovascular: overall no swelling, no varicosities, no edema bilaterally, normal temperatures of the legs and arms, no clubbing, cyanosis and good capillary refill.  Lymphatic: palpation is normal.  The left lower extremity is examined:  Inspection:  Thigh:  Non-tender and no defects  Knee has swelling 1+ effusion.                        Joint tenderness is present                        Patient is tender over the medial joint line  Lower Leg:  Has normal appearance and no tenderness or defects  Ankle:  Non-tender and no defects  Foot:  Non-tender and no defects  Range of Motion:  Knee:  Range of motion is: 0-110                        Crepitus is  present  Ankle:  Range of motion is normal. Strength and Tone:  The left lower extremity has normal strength and tone. Stability:  Knee:  The knee is stable.  Ankle:  The ankle is stable.    The patient has been educated about the nature of the problem(s) and counseled on treatment options.  The patient appeared to understand what I have discussed and is in agreement with it.  Encounter Diagnosis  Name Primary?  . Acute pain of left knee Yes    PLAN Call if any  problems.  Precautions discussed.  Continue current medications.   Return to clinic 1 month   Electronically Signed Darreld McleanWayne Dominic Rhome, MD 1/30/201811:03 AM

## 2016-06-05 ENCOUNTER — Encounter: Payer: Self-pay | Admitting: Orthopaedic Surgery

## 2016-06-05 ENCOUNTER — Ambulatory Visit (INDEPENDENT_AMBULATORY_CARE_PROVIDER_SITE_OTHER): Payer: PRIVATE HEALTH INSURANCE | Admitting: Orthopaedic Surgery

## 2016-06-05 VITALS — BP 109/80 | HR 96 | Temp 98.1°F | Ht 65.0 in | Wt 265.0 lb

## 2016-06-05 DIAGNOSIS — G8929 Other chronic pain: Secondary | ICD-10-CM | POA: Diagnosis not present

## 2016-06-05 DIAGNOSIS — M25562 Pain in left knee: Secondary | ICD-10-CM

## 2016-06-05 NOTE — Progress Notes (Signed)
Patient Debra Parker, female DOB:1985/06/30, 31 y.o. [id number]  Chief Complaint  Patient presents with  . Follow-up    left knee pain   HPI  Debra Parker is a 31 y.o. female who has left knee pain.  MRI in January showed fibrillation of medial femoral cartilage and fissure of lateral patella cartilage.  She has less pain now and is taking her NSAIDs.  She has more pain with increased activity and rainy weather.  She has no giving way. HPI  Body mass index is 44.1 kg/m.  ROS  Review of Systems  HENT: Negative for congestion.   Respiratory: Negative for cough and shortness of breath.   Cardiovascular: Negative for chest pain and leg swelling.  Endocrine: Negative for cold intolerance.  Musculoskeletal: Positive for arthralgias.  Allergic/Immunologic: Negative for environmental allergies.    Past Medical History:  Diagnosis Date  . Bronchitis     Past Surgical History:  Procedure Laterality Date  . CESAREAN SECTION    . WISDOM TOOTH EXTRACTION      No family history on file.  Social History Social History  Substance Use Topics  . Smoking status: Never Smoker  . Smokeless tobacco: Never Used  . Alcohol use No    Allergies  Allergen Reactions  . Sulfa Antibiotics Other (See Comments)    Not sure.    Current Outpatient Prescriptions  Medication Sig Dispense Refill  . HYDROcodone-acetaminophen (NORCO) 7.5-325 MG tablet One tablet every four hours as needed for pain.  Five day supply for acute pain per [facility]. 30 tablet 0  . naproxen (NAPROSYN) 500 MG tablet Take 1 tablet (500 mg total) by mouth 2 (two) times daily with a meal. 60 tablet 5   No current facility-administered medications for this visit.      Physical Exam  Blood pressure 109/80, pulse 96, temperature 98.1 F (36.7 C), height 5\' 5"  (1.651 m), weight 265 lb (120.2 kg).  Constitutional: overall normal hygiene, normal nutrition, well developed, normal grooming, normal body  habitus. Assistive device:none  Musculoskeletal: gait and station Limp left, muscle tone and strength are normal, no tremors or atrophy is present.  .  Neurological: coordination overall normal.  Deep tendon reflex/nerve stretch intact.  Sensation normal.  Cranial nerves II-XII intact.   Skin:   Normal overall no scars, lesions, ulcers or rashes. No psoriasis.  Psychiatric: Alert and oriented x 3.  Recent memory intact, remote memory unclear.  Normal mood and affect. Well groomed.  Good eye contact.  Cardiovascular: overall no swelling, no varicosities, no edema bilaterally, normal temperatures of the legs and arms, no clubbing, cyanosis and good capillary refill.  Lymphatic: palpation is normal.  The left lower extremity is examined:  Inspection:  Thigh:  Non-tender and no defects  Knee does not have swelling 0 effusion.                        Joint tenderness is present                        Patient is tender over the medial joint line  Lower Leg:  Has normal appearance and no tenderness or defects  Ankle:  Non-tender and no defects  Foot:  Non-tender and no defects Range of Motion:  Knee:  Range of motion is: 0-115  Crepitus is  present  Ankle:  Range of motion is normal. Strength and Tone:  The left lower extremity has normal strength and tone. Stability:  Knee:  The knee is stable.  Ankle:  The ankle is stable.    The patient has been educated about the nature of the problem(s) and counseled on treatment options.  The patient appeared to understand what I have discussed and is in agreement with it.  Encounter Diagnosis  Name Primary?  . Chronic pain of left knee Yes    PLAN Call if any problems.  Precautions discussed.  Continue current medications.   Return to clinic 6 weeks   Electronically Signed Darreld McleanWayne Nyasia Baxley, MD 3/1/201811:26 AM

## 2016-07-09 ENCOUNTER — Encounter: Payer: Self-pay | Admitting: Orthopaedic Surgery

## 2016-07-09 ENCOUNTER — Ambulatory Visit: Payer: Self-pay | Admitting: Orthopaedic Surgery

## 2017-04-07 DIAGNOSIS — K5792 Diverticulitis of intestine, part unspecified, without perforation or abscess without bleeding: Secondary | ICD-10-CM

## 2017-04-07 HISTORY — DX: Diverticulitis of intestine, part unspecified, without perforation or abscess without bleeding: K57.92

## 2017-05-29 ENCOUNTER — Ambulatory Visit: Payer: Self-pay | Admitting: Emergency Medicine

## 2017-05-29 VITALS — BP 110/70 | HR 72 | Temp 97.9°F | Resp 16 | Wt 263.2 lb

## 2017-05-29 DIAGNOSIS — J40 Bronchitis, not specified as acute or chronic: Secondary | ICD-10-CM

## 2017-05-29 MED ORDER — MONTELUKAST SODIUM 10 MG PO TABS: 10.0000 mg | ORAL_TABLET | Freq: Every day | ORAL | 0 refills | Status: DC

## 2017-05-29 MED ORDER — BENZONATATE 100 MG PO CAPS: 100.0000 mg | ORAL_CAPSULE | Freq: Three times a day (TID) | ORAL | 0 refills | Status: DC | PRN

## 2017-05-29 MED FILL — MONTELUKAST SOD 10 MG TAB: 10 | 15 days supply | Qty: 15 | Fill #0

## 2017-05-29 MED FILL — BENZONATATE 100 MG CAP: 100 | 5 days supply | Qty: 30 | Fill #0

## 2017-05-29 NOTE — Progress Notes (Signed)
  Subjective:  Debra Parker is a 32 y.o. female who presents for evaluation of URI like symptoms.  Symptoms include cough described as nonproductive and barking, no fever and non productive cough.  Onset of symptoms was 3 days ago, and has been gradually worsening since that time.  Treatment to date:  patient using prednisone, albuterol inhaler and tessalon pearls for the last 3 days since symptoms began.  High risk factors for influenza complications:  none.  The following portions of the patient's history were reviewed and updated as appropriate:  allergies, current medications and past medical history.  Pertinent items noted in HPI and remainder of comprehensive ROS otherwise negative. Objective:  General appearance: alert and no distress Head: Normocephalic, without obvious abnormality, atraumatic, sinuses nontender to percussion Ears: normal TM's and external ear canals both ears Nose: Nares normal. Septum midline. Mucosa normal. No drainage or sinus tenderness. Throat: lips, mucosa, and tongue normal; teeth and gums normal and + 3 tonsils no exudate observed Lungs: clear to auscultation bilaterally and dry cough observed on exam- nonproductive Heart: regular rate and rhythm, S1, S2 normal, no murmur, click, rub or gallop Abdomen: soft, non-tender; bowel sounds normal; no masses,  no organomegaly Lymph nodes: Cervical, supraclavicular, and axillary nodes normal. Patient exam otherwise WNL, in NAD during visit. Vitals:   05/29/17 1510  BP: 110/70  Pulse: 72  Resp: 16  Temp: 97.9 F (36.6 C)  SpO2: 98%     Assessment:  Bronchitis    Plan:  Discussed the importance of avoiding unnecessary antibiotic therapy. Suggested symptomatic OTC remedies. Supportive care with appropriate antipyretics and fluids. Follow up as needed.  PLAN OF CARE: Continue: taking steroid for a total of 5 days and use of inhaler as needed every 4-6 hours for cough Start: Tessalon Perles 100-200 mg every  8 hours with full glass of water as discussed Start: Singulair 10 mg nightly as discussed for the next 2 weeks Follow up: If by day 7-10 symptoms become worse of do not improve or for emergent concerns Work note provided for 48 hours- may return at discretion if symptoms abate as discussed.  Meds ordered this encounter  Medications  . montelukast (SINGULAIR) 10 MG tablet    Sig: Take 1 tablet (10 mg total) by mouth at bedtime.    Dispense:  15 tablet    Refill:  0    Order Specific Question:   Supervising Provider    Answer:   Stacie GlazeJENKINS, JOHN E [[id number]]  . benzonatate (TESSALON PERLES) 100 MG capsule    Sig: Take 1-2 capsules (100-200 mg total) by mouth 3 (three) times daily as needed (may take 1-2 caps as needed thanks).    Dispense:  30 capsule    Refill:  0    Order Specific Question:   Supervising Provider    Answer:   Stacie GlazeJENKINS, JOHN E [phone][[id number]]

## 2017-05-29 NOTE — Patient Instructions (Addendum)
PLAN OF CARE:  Continue: taking steroid for a total of 5 days and use of inhaler as needed every 4-6 hours for cough Start: Tessalon Perles 100-200 mg every 8 hours with full glass of water as discussed Start: Singulair 10 mg nightly as discussed for the next 2 weeks Follow up: If by day 7-10 symptoms become worse of do not improve or for emergent concerns Work note provided for 48 hours- may return at discretion if symptoms abate as discussed.  Acute Bronchitis, Adult Acute bronchitis is when air tubes (bronchi) in the lungs suddenly get swollen. The condition can make it hard to breathe. It can also cause these symptoms:  A cough.  Coughing up clear, yellow, or green mucus.  Wheezing.  Chest congestion.  Shortness of breath.  A fever.  Body aches.  Chills.  A sore throat.  Follow these instructions at home: Medicines  Take over-the-counter and prescription medicines only as told by your doctor.  If you were prescribed an antibiotic medicine, take it as told by your doctor. Do not stop taking the antibiotic even if you start to feel better. General instructions  Rest.  Drink enough fluids to keep your pee (urine) clear or pale yellow.  Avoid smoking and secondhand smoke. If you smoke and you need help quitting, ask your doctor. Quitting will help your lungs heal faster.  Use an inhaler, cool mist vaporizer, or humidifier as told by your doctor.  Keep all follow-up visits as told by your doctor. This is important. How is this prevented? To lower your risk of getting this condition again:  Wash your hands often with soap and water. If you cannot use soap and water, use hand sanitizer.  Avoid contact with people who have cold symptoms.  Try not to touch your hands to your mouth, nose, or eyes.  Make sure to get the flu shot every year.  Contact a doctor if:  Your symptoms do not get better in 2 weeks. Get help right away if:  You cough up blood.  You have  chest pain.  You have very bad shortness of breath.  You become dehydrated.  You faint (pass out) or keep feeling like you are going to pass out.  You keep throwing up (vomiting).  You have a very bad headache.  Your fever or chills gets worse. This information is not intended to replace advice given to you by your health care provider. Make sure you discuss any questions you have with your health care provider. Document Released: 09/10/2007 Document Revised: 10/31/2015 Document Reviewed: 09/12/2015 Elsevier Interactive Patient Education  2018 [employer]   Cough, Adult A cough helps to clear your throat and lungs. A cough may last only 2-3 weeks (acute), or it may last longer than 8 weeks (chronic). Many different things can cause a cough. A cough may be a sign of an illness or another medical condition. Follow these instructions at home:  Pay attention to any changes in your cough.  Take medicines only as told by your doctor. ? If you were prescribed an antibiotic medicine, take it as told by your doctor. Do not stop taking it even if you start to feel better. ? Talk with your doctor before you try using a cough medicine.  Drink enough fluid to keep your pee (urine) clear or pale yellow.  If the air is dry, use a cold steam vaporizer or humidifier in your home.  Stay away from things that make you cough at  work or at home.  If your cough is worse at night, try using extra pillows to raise your head up higher while you sleep.  Do not smoke, and try not to be around smoke. If you need help quitting, ask your doctor.  Do not have caffeine.  Do not drink alcohol.  Rest as needed. Contact a doctor if:  You have new problems (symptoms).  You cough up yellow fluid (pus).  Your cough does not get better after 2-3 weeks, or your cough gets worse.  Medicine does not help your cough and you are not sleeping well.  You have pain that gets worse or pain that is not helped  with medicine.  You have a fever.  You are losing weight and you do not know why.  You have night sweats. Get help right away if:  You cough up blood.  You have trouble breathing.  Your heartbeat is very fast. This information is not intended to replace advice given to you by your health care provider. Make sure you discuss any questions you have with your health care provider. Document Released: 12/05/2010 Document Revised: 08/30/2015 Document Reviewed: 05/31/2014 Elsevier Interactive Patient Education  [employer]2018 Elsevier Inc.

## 2017-06-03 ENCOUNTER — Telehealth: Payer: Self-pay

## 2017-10-18 ENCOUNTER — Emergency Department (HOSPITAL_COMMUNITY)
Admission: EM | Admit: 2017-10-18 | Discharge: 2017-10-18 | Disposition: A | Discharge status: Home / Self Care | Payer: Commercial Managed Care - PPO | Attending: Emergency Medicine | Admitting: Emergency Medicine

## 2017-10-18 ENCOUNTER — Emergency Department (HOSPITAL_COMMUNITY): Payer: Commercial Managed Care - PPO

## 2017-10-18 ENCOUNTER — Encounter (HOSPITAL_COMMUNITY): Payer: Self-pay | Admitting: Emergency Medicine

## 2017-10-18 ENCOUNTER — Other Ambulatory Visit: Payer: Self-pay

## 2017-10-18 DIAGNOSIS — R1031 Right lower quadrant pain: Secondary | ICD-10-CM | POA: Diagnosis not present

## 2017-10-18 DIAGNOSIS — R1030 Lower abdominal pain, unspecified: Secondary | ICD-10-CM

## 2017-10-18 LAB — CBC
HCT: 37 % (ref 36.0–46.0)
Hemoglobin: 12.3 g/dL (ref 12.0–15.0)
MCH: 28.8 pg (ref 26.0–34.0)
MCHC: 33.2 g/dL (ref 30.0–36.0)
MCV: 86.7 fL (ref 78.0–100.0)
PLATELETS: 276 10*3/uL (ref 150–400)
RBC: 4.27 MIL/uL (ref 3.87–5.11)
RDW: 12.7 % (ref 11.5–15.5)
WBC: 8.1 10*3/uL (ref 4.0–10.5)

## 2017-10-18 LAB — URINALYSIS, ROUTINE W REFLEX MICROSCOPIC
BILIRUBIN URINE: NEGATIVE
GLUCOSE, UA: NEGATIVE mg/dL
Hgb urine dipstick: NEGATIVE
Ketones, ur: NEGATIVE mg/dL
Leukocytes, UA: NEGATIVE
Nitrite: NEGATIVE
PROTEIN: NEGATIVE mg/dL
Specific Gravity, Urine: 1.015 (ref 1.005–1.030)
pH: 5 (ref 5.0–8.0)

## 2017-10-18 LAB — COMPREHENSIVE METABOLIC PANEL
ALBUMIN: 3.8 g/dL (ref 3.5–5.0)
ALK PHOS: 59 U/L (ref 38–126)
ALT: 28 U/L (ref 0–44)
ANION GAP: 7 (ref 5–15)
AST: 23 U/L (ref 15–41)
BUN: 7 mg/dL (ref 6–20)
CALCIUM: 8.8 mg/dL — AB (ref 8.9–10.3)
CO2: 24 mmol/L (ref 22–32)
Chloride: 109 mmol/L (ref 98–111)
Creatinine, Ser: 0.64 mg/dL (ref 0.44–1.00)
GFR calc Af Amer: >60 mL/min (ref >60)
GFR calc non Af Amer: >60 mL/min (ref >60)
GLUCOSE: 111 mg/dL — AB (ref 70–99)
Potassium: 3.6 mmol/L (ref 3.5–5.1)
Sodium: 140 mmol/L (ref 135–145)
Total Bilirubin: 0.4 mg/dL (ref 0.3–1.2)
Total Protein: 7.5 g/dL (ref 6.5–8.1)

## 2017-10-18 LAB — LIPASE, BLOOD: Lipase: 27 U/L (ref 11–51)

## 2017-10-18 LAB — PREGNANCY, URINE: Preg Test, Ur: NEGATIVE

## 2017-10-18 MED ORDER — ONDANSETRON 4 MG PO TBDP
4.0000 mg | ORAL_TABLET | Freq: Once | ORAL | Status: AC
  Administered 2017-10-18: 4 mg via ORAL
  Filled 2017-10-18: qty 1

## 2017-10-18 MED ORDER — IOPAMIDOL (ISOVUE-300) INJECTION 61%
100.0000 mL | Freq: Once | INTRAVENOUS | Status: AC | PRN
  Administered 2017-10-18: 100 mL via INTRAVENOUS

## 2017-10-18 MED ORDER — ONDANSETRON HCL 4 MG/2ML IJ SOLN
4.0000 mg | Freq: Once | INTRAMUSCULAR | Status: AC
  Administered 2017-10-18: 4 mg via INTRAVENOUS
  Filled 2017-10-18: qty 2

## 2017-10-18 MED ORDER — ONDANSETRON 4 MG PO TBDP: 4.0000 mg | ORAL_TABLET | Freq: Three times a day (TID) | ORAL | 0 refills | Status: DC | PRN

## 2017-10-18 MED ORDER — MELOXICAM 7.5 MG PO TABS: 15.0000 mg | ORAL_TABLET | Freq: Every day | ORAL | 0 refills | Status: DC

## 2017-10-18 MED ORDER — SODIUM CHLORIDE 0.9 % IV BOLUS
1000.0000 mL | Freq: Once | INTRAVENOUS | Status: AC
  Administered 2017-10-18: 1000 mL via INTRAVENOUS

## 2017-10-18 NOTE — Discharge Instructions (Signed)
Your testing shows that your diverticulitis is improving.  If you are still having pain within 1 week he will need to be seen at a GI doctor's office.  I will refer you to our local GI specialist, Dr. Jena Gaussourk.  Please finish her antibiotics, take Mobic twice a day for an anti-inflammatory and continue to use MiraLAX, twice a day until you are having regular soft stools.

## 2017-10-18 NOTE — ED Notes (Signed)
Pt reports she has appt with PCP this Thursday

## 2017-10-18 NOTE — ED Triage Notes (Signed)
Pt states that she is having abd pain with nausea and constipation since 10/08/17

## 2017-10-18 NOTE — ED Provider Notes (Signed)
[facility] EMERGENCY DEPARTMENT Provider Note   CSN: [id number] Arrival date & time: 10/18/17  1341     History   Chief Complaint Chief Complaint  Patient presents with  . Abdominal Pain    HPI   Dayna RamusHeather M Kosh is a 32 y.o. female.   HPI  32 year old female, she has a history of a cesarean section, no other abdominal surgical history.  She states that she was seen on July 4 at an outside hospital during which time she had a CT scan of the abdomen and pelvis because of right lower quadrant abdominal pain.  It showed that she had acute sigmoid diverticulitis and she was treated with Augmentin which she has not quite finished but has taken every day.  She reports that in the last 10 days she had a single bowel movement that was on 9 July 5 days ago, was prompted by multiple stool softeners and laxatives and has not had a stool since that time.  She even drank some magnesium citrate today but had no results.  She has ongoing abdominal discomfort but today has had some vomiting, no fevers, she is concerned because she still has ongoing symptoms despite taking the medications as prescribed.  I have reviewed the medical record and it shows that her CT scan did in fact show some proximal sigmoid diverticulitis with some adjacent inflammation of the soft tissues.  Past Medical History:  Diagnosis Date  . Bronchitis     There are no active problems to display for this patient.   Past Surgical History:  Procedure Laterality Date  . CESAREAN SECTION    . fatty pocket removed    . WISDOM TOOTH EXTRACTION       OB History   None      Home Medications    Prior to Admission medications   Medication Sig Start Date End Date Taking? Authorizing Provider  acetaminophen (TYLENOL) 500 MG tablet Take 500 mg by mouth every 6 (six) hours as needed for mild pain.   Yes [provider]  amoxicillin-clavulanate (AUGMENTIN) 875-125 MG tablet Take 1 tablet by mouth 2 (two) times  daily. Starting 10/09/2017 x 10 days. 10/09/17  Yes [provider]  HYDROcodone-acetaminophen (NORCO/VICODIN) 5-325 MG tablet Take 1 tablet by mouth every 4 (four) hours as needed for moderate pain.  10/12/17  Yes [provider]  ondansetron (ZOFRAN-ODT) 4 MG disintegrating tablet Take 1 tablet by mouth every 8 (eight) hours as needed for nausea or vomiting.  10/14/17  Yes [provider]  benzonatate (TESSALON PERLES) 100 MG capsule Take 1-2 capsules (100-200 mg total) by mouth 3 (three) times daily as needed (may take 1-2 caps as needed thanks). 05/29/17   Dorena BodoKennard, Lawrence, NP  meloxicam (MOBIC) 7.5 MG tablet Take 2 tablets (15 mg total) by mouth daily. 10/18/17   Eber HongMiller, Norell Brisbin, MD    Family History No family history on file.  Social History Social History   Tobacco Use  . Smoking status: Never Smoker  . Smokeless tobacco: Never Used  Substance Use Topics  . Alcohol use: No  . Drug use: No     Allergies   Sulfa antibiotics   Review of Systems Review of Systems  All other systems reviewed and are negative.    Physical Exam Updated Vital Signs BP 105/69   Pulse (!) 56   Temp 98.2 F (36.8 C) (Oral)   Resp 18   Ht 5\' 6"  (1.676 m)   Wt 117.9 kg (  260 lb)   SpO2 98%   BMI 41.97 kg/m   Physical Exam  Constitutional: She appears well-developed and well-nourished. No distress.  HENT:  Head: Normocephalic and atraumatic.  Mouth/Throat: Oropharynx is clear and moist. No oropharyngeal exudate.  Eyes: Pupils are equal, round, and reactive to light. Conjunctivae and EOM are normal. Right eye exhibits no discharge. Left eye exhibits no discharge. No scleral icterus.  Neck: Normal range of motion. Neck supple. No JVD present. No thyromegaly present.  Cardiovascular: Normal rate, regular rhythm, normal heart sounds and intact distal pulses. Exam reveals no gallop and no friction rub.  No murmur heard. Pulmonary/Chest: Effort normal and breath sounds normal.  No respiratory distress. She has no wheezes. She has no rales.  Abdominal: Soft. Bowel sounds are normal. She exhibits no distension and no mass. There is tenderness ( Focal right lower quadrant tenderness without guarding, midline lower abdominal tenderness, no left-sided abdominal tenderness).  Musculoskeletal: Normal range of motion. She exhibits no edema or tenderness.  Lymphadenopathy:    She has no cervical adenopathy.  Neurological: She is alert. Coordination normal.  Skin: Skin is warm and dry. No rash noted. No erythema.  Psychiatric: She has a normal mood and affect. Her behavior is normal.  Nursing note and vitals reviewed.    ED Treatments / Results  Labs (all labs ordered are listed, but only abnormal results are displayed) Labs Reviewed  COMPREHENSIVE METABOLIC PANEL - Abnormal; Notable for the following components:      Result Value   Glucose, Bld 111 (*)    Calcium 8.8 (*)    All other components within normal limits  LIPASE, BLOOD  CBC  URINALYSIS, ROUTINE W REFLEX MICROSCOPIC  PREGNANCY, URINE    EKG None  Radiology Ct Abdomen Pelvis W Contrast  Result Date: 10/18/2017 CLINICAL DATA:  Abdominal pain with nausea and constipation since 10/08/2017. History of diverticulitis. Patient is still on antibiotics. No improvement in pain. EXAM: CT ABDOMEN AND PELVIS WITH CONTRAST TECHNIQUE: Multidetector CT imaging of the abdomen and pelvis was performed using the standard protocol following bolus administration of intravenous contrast. CONTRAST:  ISOVUE-300 IOPAMIDOL (ISOVUE-300) INJECTION 61% COMPARISON:  CT of the abdomen and pelvis on 10/08/2017 FINDINGS: Lower chest: No acute abnormality. Hepatobiliary: No focal liver abnormality is seen. No radiopaque gallstones, biliary dilatation, or pericholecystic inflammatory changes. Pancreas: Unremarkable. No pancreatic ductal dilatation or surrounding inflammatory changes. Spleen: Normal in size without focal abnormality.  Adrenals/Urinary Tract: 1.5 centimeter RIGHT adrenal nodule is stable in appearance and indeterminate for adenoma. The LEFT adrenal is normal in appearance. There is symmetric enhancement of both kidneys. No hydronephrosis. No ureteral obstruction or suspicious renal mass. Urinary bladder is decompressed and normal in appearance. Stomach/Bowel: The stomach and small bowel loops are normal in appearance. There is focal thickening in the proximal sigmoid colon, in the area of previous abnormality. The surrounding inflammation and bowel wall thickening are significantly improved. There is persistent focal mild thickening of the sigmoid in this region. Vascular/Lymphatic: No significant vascular findings are present. No enlarged abdominal or pelvic lymph nodes. Reproductive: Intrauterine device is identified in the central uterus as expected. Adnexal regions are normal in CT appearance. Other: No free pelvic fluid. Anterior abdominal wall is unremarkable. Musculoskeletal: No acute or significant osseous findings. IMPRESSION: 1. Significant improvement in the appearance of sigmoid colon inflammatory changes. There is persistent focal wall thickening in the proximal sigmoid. Given the persistence of the thickening, malignancy should be excluded but is less likely than  resolving diverticulitis. 2. Recommend follow-up CT or colonoscopy. 3. Stable appearance of small RIGHT adrenal nodule which is favored to be benign but has not been fully characterize. 4. Normal location of intrauterine device. Electronically Signed   By: Norva Pavlov M.D.   On: 10/18/2017 15:48    Procedures Procedures (including critical care time)  Medications Ordered in ED Medications  sodium chloride 0.9 % bolus 1,000 mL (0 mLs Intravenous Stopped 10/18/17 1615)  ondansetron (ZOFRAN) injection 4 mg (4 mg Intravenous Given 10/18/17 1509)  iopamidol (ISOVUE-300) 61 % injection 100 mL (100 mLs Intravenous Contrast Given 10/18/17 1525)      Initial Impression / Assessment and Plan / ED Course  I have reviewed the triage vital signs and the nursing notes.  Pertinent labs & imaging results that were available during my care of the patient were reviewed by me and considered in my medical decision making (see chart for details).     The patient's exam is consistent with ongoing inflammatory condition, will need to repeat the CT scan given 10 days of worsening symptoms despite Augmentin, will perform with contrast to evaluate for abscess microperforation or other complicating factor of the diverticulitis.  Patient agreeable.  CT scan unremarkable, shows improving diverticulitis without signs of abscess or perforation.  Labs are totally normal without a leukocytosis or urinary infection.  Patient informed of the results, encouraged to continue MiraLAX, anti-inflammatories given, stable to discharge and will follow up with GI if no improvement.  Patient agreeable  Final Clinical Impressions(s) / ED Diagnoses   Final diagnoses:  Lower abdominal pain    ED Discharge Orders        Ordered    meloxicam (MOBIC) 7.5 MG tablet  Daily     10/18/17 1706       Eber Hong, MD 10/18/17 1706

## 2017-10-18 NOTE — ED Notes (Signed)
Here from CT  Has had watery stool   Complains of N

## 2018-01-21 ENCOUNTER — Encounter: Payer: Self-pay | Admitting: Family Medicine

## 2018-01-21 ENCOUNTER — Ambulatory Visit: Payer: 59 | Admitting: Family Medicine

## 2018-01-21 VITALS — BP 114/68 | HR 86 | Temp 98.5°F | Wt 265.2 lb

## 2018-01-21 DIAGNOSIS — J209 Acute bronchitis, unspecified: Secondary | ICD-10-CM

## 2018-01-21 MED ORDER — AMOXICILLIN-POT CLAVULANATE 875-125 MG PO TABS: 1.0000 | ORAL_TABLET | Freq: Two times a day (BID) | ORAL | 0 refills | Status: DC

## 2018-01-21 NOTE — Progress Notes (Signed)
   Subjective:    Patient ID: Debra Parker, female    DOB: April 06, 1986, 32 y.o.   MRN: [id number]  HPI Here for one week if PND, chest congestion and coughing up green sputum. No fever. She had some Prednisone tablets at home and she has taken a total of 40 mg a day fore the past 2 days.    Review of Systems  Constitutional: Negative.   HENT: Positive for congestion and postnasal drip. Negative for sinus pressure, sinus pain and sore throat.   Eyes: Negative.   Respiratory: Positive for cough and chest tightness.        Objective:   Physical Exam  Constitutional: She appears well-developed and well-nourished.  HENT:  Right Ear: External ear normal.  Left Ear: External ear normal.  Nose: Nose normal.  Mouth/Throat: Oropharynx is clear and moist.  Eyes: Conjunctivae are normal.  Neck: No thyromegaly present.  Cardiovascular: Normal rate, regular rhythm, normal heart sounds and intact distal pulses.  Pulmonary/Chest: Effort normal. No stridor. No respiratory distress. She has no wheezes. She has no rales.  Scattered rhonchi   Lymphadenopathy:    She has no cervical adenopathy.          Assessment & Plan:  Bronchitis, treat with Augmentin for 10 days. She has 4 more 10 mg tablets of Prednisone at home so I advised her to take these one a day for 4 days. Add Delsym prn. She will be establishing care with Dr. Hassan Rowan on 02-01-18.  Gershon Crane, MD

## 2018-02-01 ENCOUNTER — Encounter: Payer: Self-pay | Admitting: Family Medicine

## 2018-02-01 ENCOUNTER — Ambulatory Visit (INDEPENDENT_AMBULATORY_CARE_PROVIDER_SITE_OTHER): Payer: 59 | Admitting: Family Medicine

## 2018-02-01 VITALS — BP 124/70 | HR 75 | Temp 98.0°F | Ht 65.0 in | Wt 268.8 lb

## 2018-02-01 DIAGNOSIS — Z13220 Encounter for screening for lipoid disorders: Secondary | ICD-10-CM

## 2018-02-01 DIAGNOSIS — Z131 Encounter for screening for diabetes mellitus: Secondary | ICD-10-CM | POA: Diagnosis not present

## 2018-02-01 LAB — COMPREHENSIVE METABOLIC PANEL
ALT: 17 U/L (ref 0–35)
AST: 16 U/L (ref 0–37)
Albumin: 3.9 g/dL (ref 3.5–5.2)
Alkaline Phosphatase: 69 U/L (ref 39–117)
BUN: 9 mg/dL (ref 6–23)
CHLORIDE: 101 meq/L (ref 96–112)
CO2: 29 meq/L (ref 19–32)
Calcium: 8.8 mg/dL (ref 8.4–10.5)
Creatinine, Ser: 0.58 mg/dL (ref 0.40–1.20)
GFR: 125.98 mL/min (ref >60.00)
GLUCOSE: 82 mg/dL (ref 70–99)
Potassium: 4.1 mEq/L (ref 3.5–5.1)
SODIUM: 137 meq/L (ref 135–145)
Total Bilirubin: 0.3 mg/dL (ref 0.2–1.2)
Total Protein: 7 g/dL (ref 6.0–8.3)

## 2018-02-01 LAB — LIPID PANEL
CHOL/HDL RATIO: 3
Cholesterol: 158 mg/dL (ref 0–200)
HDL: 46.4 mg/dL (ref >39.00)
LDL Cholesterol: 92 mg/dL (ref 0–99)
NONHDL: 111.61
Triglycerides: 99 mg/dL (ref 0.0–149.0)
VLDL: 19.8 mg/dL (ref 0.0–40.0)

## 2018-02-01 LAB — TSH: TSH: 1.63 u[IU]/mL (ref 0.35–4.50)

## 2018-02-01 MED ORDER — PHENTERMINE HCL 37.5 MG PO CAPS
37.5000 mg | ORAL_CAPSULE | ORAL | 0 refills | Status: DC
Start: 2018-02-01 — End: 2018-03-03

## 2018-02-01 NOTE — Progress Notes (Signed)
Debra Parker DOB: 1985-09-23 Encounter date: 02/01/2018  This is a 32 y.o. female who presents to establish care. Chief Complaint  Patient presents with  . New Patient (Initial Visit)    no new concerns    History of present illness: Hasn't had regular provider in some time.   Migraines: used to get them very bad. Have gotten better. Getting them once or twice a year. Takes tylenol or excedrin migraine which works. Was on preventative in past.   Got diverticulitis this summer but she had constipation with it. Miserable. Bowels regular, soft.   HTN when pregnant only.  Has had a hard time losing weight and keeping it off. Not exercising as much now. Times she is eating is hard. Trying to get home; fixing dinner. Has had success with phentermine in past (has been years since trying); but was able to lose 30 lbs with diet, exercise and med. Interested in working on Horticulturist, commercial.   Past Medical History:  Diagnosis Date  . Bronchitis   . Diverticulitis 2019  . Hypertension    when pregnant  . Migraines    Past Surgical History:  Procedure Laterality Date  . CESAREAN SECTION  2007  . fatty pocket removed  1999  . WISDOM TOOTH EXTRACTION  2000   Allergies  Allergen Reactions  . Sulfa Antibiotics Other (See Comments)    Not sure.   No outpatient medications have been marked as taking for the 02/01/18 encounter (Office Visit) with Wynn Banker, MD.   Social History   Tobacco Use  . Smoking status: Never Smoker  . Smokeless tobacco: Never Used  Substance Use Topics  . Alcohol use: No   Family History  Problem Relation Age of Onset  . Diabetes Father   . Hyperlipidemia Father   . Early death Sister        stillborn  . Heart disease Brother   . Heart attack Brother 40  . Arthritis Maternal Grandmother   . Diabetes Maternal Grandmother   . Heart attack Maternal Grandmother   . Throat cancer Maternal Grandfather   . Diabetes Paternal Grandfather   .  Stroke Paternal Grandfather      Review of Systems  Constitutional: Negative for chills, fatigue and fever. Unexpected weight change: unhappy with weight gain/difficulty with being able to lose weight.  Respiratory: Negative for cough, chest tightness, shortness of breath and wheezing.   Cardiovascular: Negative for chest pain, palpitations and leg swelling.  Gastrointestinal: Negative for abdominal pain, constipation, diarrhea and vomiting.  Psychiatric/Behavioral: Negative for sleep disturbance.    Objective:  BP 124/70 (BP Location: Left Arm, Patient Position: Sitting, Cuff Size: Normal)   Pulse 75   Temp 98 F (36.7 C) (Oral)   Ht 5\' 5"  (1.651 m)   Wt 268 lb 12.8 oz (121.9 kg)   SpO2 97%   BMI 44.73 kg/m   Weight: 268 lb 12.8 oz (121.9 kg)   BP Readings from Last 3 Encounters:  02/01/18 124/70  01/21/18 114/68  10/18/17 105/69   Wt Readings from Last 3 Encounters:  02/01/18 268 lb 12.8 oz (121.9 kg)  01/21/18 265 lb 4 oz (120.3 kg)  10/18/17 260 lb (117.9 kg)    Physical Exam  Constitutional: She is oriented to person, place, and time. She appears well-developed and well-nourished. No distress.  Cardiovascular: Normal rate, regular rhythm and normal heart sounds. Exam reveals no friction rub.  No murmur heard. No lower extremity edema  Pulmonary/Chest: Effort normal and  breath sounds normal. No respiratory distress. She has no wheezes. She has no rales.  Neurological: She is alert and oriented to person, place, and time.  Psychiatric: Her behavior is normal. Cognition and memory are normal.    Assessment/Plan: 1. Lipid screening - Lipid panel; Future - Lipid panel  2. Morbid obesity (HCC) We discussed importance of regular exercise and healthy eating.  We discussed importance of planning ahead for meals, especially since she has multiple children and a large family.  She does have a [employer] we discussed importance of using this and trying to fit in  workouts when able (when children are playing sports).  We will start phentermine.  She has done well with this before.  Bring her back in 1 month for weight recheck as well as complete physical. - TSH; Future - phentermine 37.5 MG capsule; Take 1 capsule (37.5 mg total) by mouth every morning.  Dispense: 30 capsule; Refill: 0 - TSH  3. Screening for diabetes mellitus - Comprehensive metabolic panel; Future - Comprehensive metabolic panel  Return in about 1 month (around 03/04/2018) for physical exam.  Theodis Shove, MD

## 2018-02-13 ENCOUNTER — Encounter: Payer: Self-pay | Admitting: Family Medicine

## 2018-02-15 ENCOUNTER — Other Ambulatory Visit: Payer: Self-pay | Admitting: Family Medicine

## 2018-02-15 DIAGNOSIS — J40 Bronchitis, not specified as acute or chronic: Secondary | ICD-10-CM

## 2018-02-15 MED ORDER — BENZONATATE 100 MG PO CAPS: 100.0000 mg | ORAL_CAPSULE | Freq: Three times a day (TID) | ORAL | 0 refills | Status: DC | PRN

## 2018-02-15 MED ORDER — DOXYCYCLINE HYCLATE 100 MG PO TABS: 100.0000 mg | ORAL_TABLET | Freq: Two times a day (BID) | ORAL | 0 refills | Status: AC

## 2018-03-03 ENCOUNTER — Ambulatory Visit (INDEPENDENT_AMBULATORY_CARE_PROVIDER_SITE_OTHER): Payer: 59 | Admitting: Family Medicine

## 2018-03-03 ENCOUNTER — Encounter: Payer: Self-pay | Admitting: Family Medicine

## 2018-03-03 VITALS — BP 118/62 | HR 91 | Temp 98.3°F | Wt 262.6 lb

## 2018-03-03 DIAGNOSIS — Z0000 Encounter for general adult medical examination without abnormal findings: Secondary | ICD-10-CM

## 2018-03-03 MED ORDER — PHENTERMINE HCL 37.5 MG PO CAPS: 37.5000 mg | ORAL_CAPSULE | ORAL | 1 refills | Status: DC

## 2018-03-03 NOTE — Progress Notes (Signed)
Debra SparkHeather Marie Parker DOB: [birth year] Encounter date: 03/03/2018  This is a 32 y.o. female who presents for complete physical   History of present illness/Additional concerns:  At last visit 10/28 trial of phentermine given. She was successful with this for weight loss in the past. Is taking this every day.  Doing well with phentermine. No concerns. Trying to do meal prep. Been doing more exercise. Feels she needs to increase exercise. Does have [employer] membership, just hasn't been going.   Past Medical History:  Diagnosis Date  . Bronchitis   . Diverticulitis 2019  . Hypertension    when pregnant  . Migraines    Past Surgical History:  Procedure Laterality Date  . CESAREAN SECTION  2007  . fatty pocket removed  1999  . WISDOM TOOTH EXTRACTION  2000   Allergies  Allergen Reactions  . Sulfa Antibiotics Other (See Comments)    Not sure.   Current Meds  Medication Sig  . acetaminophen (TYLENOL) 500 MG tablet Take 500 mg by mouth every 6 (six) hours as needed for mild pain.  . phentermine 37.5 MG capsule Take 1 capsule (37.5 mg total) by mouth every morning.  . [DISCONTINUED] phentermine 37.5 MG capsule Take 1 capsule (37.5 mg total) by mouth every morning.   Social History   Tobacco Use  . Smoking status: Never Smoker  . Smokeless tobacco: Never Used  Substance Use Topics  . Alcohol use: No   Family History  Problem Relation Age of Onset  . Diabetes Father   . Hyperlipidemia Father   . Early death Sister        stillborn  . Heart disease Brother   . Heart attack Brother 40  . Arthritis Maternal Grandmother   . Diabetes Maternal Grandmother   . Heart attack Maternal Grandmother   . Throat cancer Maternal Grandfather   . Diabetes Paternal Grandfather   . Stroke Paternal Grandfather      Review of Systems  Constitutional: Negative for activity change, appetite change, chills, fatigue, fever and unexpected weight change.  HENT: Negative for congestion, ear pain,  hearing loss, sinus pressure, sinus pain, sore throat and trouble swallowing.   Eyes: Negative for pain and visual disturbance.  Respiratory: Negative for cough, chest tightness, shortness of breath and wheezing.   Cardiovascular: Negative for chest pain, palpitations and leg swelling.  Gastrointestinal: Negative for abdominal pain, blood in stool, constipation, diarrhea, nausea and vomiting.  Genitourinary: Negative for difficulty urinating and menstrual problem.  Musculoskeletal: Negative for arthralgias and back pain.  Skin: Negative for rash.  Neurological: Negative for dizziness, weakness, numbness and headaches.  Hematological: Negative for adenopathy. Does not bruise/bleed easily.  Psychiatric/Behavioral: Negative for sleep disturbance and suicidal ideas. The patient is not nervous/anxious.     CBC:  Lab Results  Component Value Date   WBC 8.1 10/18/2017   HGB 12.3 10/18/2017   HCT 37.0 10/18/2017   MCH 28.8 10/18/2017   MCHC 33.2 10/18/2017   RDW 12.7 10/18/2017   PLT 276 10/18/2017   CMP: Lab Results  Component Value Date   NA 137 02/01/2018   K 4.1 02/01/2018   CL 101 02/01/2018   CO2 29 02/01/2018   ANIONGAP 7 10/18/2017   GLUCOSE 82 02/01/2018   BUN 9 02/01/2018   CREATININE 0.58 02/01/2018   GFRAA >60 10/18/2017   CALCIUM 8.8 02/01/2018   PROT 7.0 02/01/2018   BILITOT 0.3 02/01/2018   ALKPHOS 69 02/01/2018   ALT 17 02/01/2018   AST  16 02/01/2018   LIPID: Lab Results  Component Value Date   CHOL 158 02/01/2018   TRIG 99.0 02/01/2018   HDL 46.40 02/01/2018   LDLCALC 92 02/01/2018    Objective:  BP 118/62 (BP Location: Left Arm, Patient Position: Sitting, Cuff Size: Normal)   Pulse 91   Temp 98.3 F (36.8 C) (Oral)   Wt 262 lb 9.6 oz (119.1 kg)   SpO2 94%   BMI 43.70 kg/m   Weight: 262 lb 9.6 oz (119.1 kg)   BP Readings from Last 3 Encounters:  03/03/18 118/62  02/01/18 124/70  01/21/18 114/68   Wt Readings from Last 3 Encounters:   03/03/18 262 lb 9.6 oz (119.1 kg)  02/01/18 268 lb 12.8 oz (121.9 kg)  01/21/18 265 lb 4 oz (120.3 kg)    Physical Exam  Constitutional: She is oriented to person, place, and time. She appears well-developed and well-nourished. No distress.  HENT:  Head: Normocephalic and atraumatic.  Right Ear: External ear normal.  Left Ear: External ear normal.  Mouth/Throat: Oropharynx is clear and moist. No oropharyngeal exudate.  Eyes: Pupils are equal, round, and reactive to light. Conjunctivae are normal.  Neck: Normal range of motion. Neck supple. No thyromegaly present.  Cardiovascular: Normal rate, regular rhythm and normal heart sounds. Exam reveals no gallop and no friction rub.  No murmur heard. Pulmonary/Chest: Effort normal and breath sounds normal.  Abdominal: Soft. Bowel sounds are normal. She exhibits no distension and no mass. There is no tenderness. There is no guarding. No hernia.  Musculoskeletal: Normal range of motion. She exhibits no edema, tenderness or deformity.  Lymphadenopathy:    She has no cervical adenopathy.  Neurological: She is alert and oriented to person, place, and time. She has normal strength. She displays normal reflexes.  Reflex Scores:      Tricep reflexes are 2+ on the right side and 2+ on the left side.      Bicep reflexes are 2+ on the right side and 2+ on the left side.      Brachioradialis reflexes are 2+ on the right side and 2+ on the left side.      Patellar reflexes are 2+ on the right side and 2+ on the left side. Skin: Skin is warm and dry. No rash noted.  Psychiatric: She has a normal mood and affect. Her speech is normal and behavior is normal. Thought content normal.    Assessment/Plan: Health Maintenance Due  Topic Date Due  . HIV Screening  06/02/1998  . TETANUS/TDAP  06/02/2002  . PAP SMEAR  07/21/2015   Health Maintenance reviewed - still have not received all records including past immunizations. 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 1. Well exam: discussed working on increasing exercise and keeping check on healthy eating, portion control.   2. Morbid obesity (HCC) Doing well with med; recheck weight in 2 months time in office. Suggested every 2 weeks to look at activity level or diet and make a small change to continue seeing benefits.  - phentermine 37.5 MG capsule; Take 1 capsule (37.5 mg total) by mouth every morning.  Dispense: 30 capsule; Refill: 1  Return in about 2 months (around 05/03/2018).  Theodis Shove, MD

## 2018-03-17 ENCOUNTER — Ambulatory Visit: Payer: 59 | Admitting: Family Medicine

## 2018-03-17 ENCOUNTER — Ambulatory Visit (INDEPENDENT_AMBULATORY_CARE_PROVIDER_SITE_OTHER): Payer: 59

## 2018-03-17 ENCOUNTER — Encounter: Payer: Self-pay | Admitting: Family Medicine

## 2018-03-17 VITALS — BP 116/62 | HR 76 | Temp 98.2°F | Wt 262.0 lb

## 2018-03-17 DIAGNOSIS — M7662 Achilles tendinitis, left leg: Secondary | ICD-10-CM | POA: Diagnosis not present

## 2018-03-17 DIAGNOSIS — M79672 Pain in left foot: Secondary | ICD-10-CM

## 2018-03-17 DIAGNOSIS — M7732 Calcaneal spur, left foot: Secondary | ICD-10-CM | POA: Diagnosis not present

## 2018-03-17 MED ORDER — MELOXICAM 7.5 MG PO TABS: 7.5000 mg | ORAL_TABLET | Freq: Every day | ORAL | 0 refills | Status: DC

## 2018-03-17 MED ORDER — PREDNISONE 10 MG PO TABS: ORAL_TABLET | ORAL | 0 refills | Status: DC

## 2018-03-17 NOTE — Patient Instructions (Signed)
Achilles Tendinitis  Achilles tendinitis is inflammation of the tough, cord-like band that attaches the lower leg muscles to the heel bone (Achilles tendon). This is usually caused by overusing the tendon and the ankle joint.  Achilles tendinitis usually gets better over time with treatment and caring for yourself at home. It can take weeks or months to heal completely.  What are the causes?  This condition may be caused by:  · A sudden increase in exercise or activity, such as running.  · Doing the same exercises or activities (such as jumping) over and over.  · Not warming up calf muscles before exercising.  · Exercising in shoes that are worn out or not made for exercise.  · Having arthritis or a bone growth (spur) on the back of the heel bone. This can rub against the tendon and hurt it.  · Age-related wear and tear. Tendons become less flexible with age and more likely to be injured.    What are the signs or symptoms?  Common symptoms of this condition include:  · Pain in the Achilles tendon or in the back of the leg, just above the heel. The pain usually gets worse with exercise.  · Stiffness or soreness in the back of the leg, especially in the morning.  · Swelling of the skin over the Achilles tendon.  · Thickening of the tendon.  · Bone spurs at the bottom of the Achilles tendon, near the heel.  · Trouble standing on tiptoe.    How is this diagnosed?  This condition is diagnosed based on your symptoms and a physical exam. You may have tests, including:  · X-rays.  · MRI.    How is this treated?  The goal of treatment is to relieve symptoms and help your injury heal. Treatment may include:  · Decreasing or stopping activities that caused the tendinitis. This may mean switching to low-impact exercises like biking or swimming.  · Icing the injured area.  · Doing physical therapy, including strengthening and stretching exercises.  · NSAIDs to help relieve pain and swelling.  · Using supportive shoes, wraps,  heel lifts, or a walking boot (air cast).  · Surgery. This may be done if your symptoms do not improve after 6 months.  · Using high-energy shock wave impulses to stimulate the healing process (extracorporeal shock wave therapy). This is rare.  · Injection of medicines to help relieve inflammation (corticosteroids). This is rare.    Follow these instructions at home:  If you have an air cast:   · Wear the cast as told by your health care provider. Remove it only as told by your health care provider.  · Loosen the cast if your toes tingle, become numb, or turn cold and blue.  Activity   · Gradually return to your normal activities once your health care provider approves. Do not do activities that cause pain.  ? Consider doing low-impact exercises, like cycling or swimming.  · If you have an air cast, ask your health care provider when it is safe for you to drive.  · If physical therapy was prescribed, do exercises as told by your health care provider or physical therapist.  Managing pain, stiffness, and swelling   · Raise (elevate) your foot above the level of your heart while you are sitting or lying down.  · Move your toes often to avoid stiffness and to lessen swelling.  · If directed, put ice on the injured area:  ?   Put ice in a plastic bag.  ? Place a towel between your skin and the bag.  ? Leave the ice on for 20 minutes, 2-3 times a day  General instructions   · If directed, wrap your foot with an elastic bandage or other wrap. This can help keep your tendon from moving too much while it heals. Your health care provider will show you how to wrap your foot correctly.  · Wear supportive shoes or heel lifts only as told by your health care provider.  · Take over-the-counter and prescription medicines only as told by your health care provider.  · Keep all follow-up visits as told by your health care provider. This is important.  Contact a health care provider if:  · You have symptoms that gets worse.  · You have  pain that does not get better with medicine.  · You develop new, unexplained symptoms.  · You develop warmth and swelling in your foot.  · You have a fever.  Get help right away if:  · You have a sudden popping sound or sensation in your Achilles tendon followed by severe pain.  · You cannot move your toes or foot.  · You cannot put any weight on your foot.  Summary  · Achilles tendinitis is inflammation of the tough, cord-like band that attaches the lower leg muscles to the heel bone (Achilles tendon).  · This condition is usually caused by overusing the tendon and the ankle joint. It can also be caused by arthritis or normal aging.  · The most common symptoms of this condition include pain, swelling, or stiffness in the Achilles tendon or in the back of the leg.  · This condition is usually treated with rest, NSAIDs, and physical therapy.  This information is not intended to replace advice given to you by your health care provider. Make sure you discuss any questions you have with your health care provider.  Document Released: 01/01/2005 Document Revised: 02/11/2016 Document Reviewed: 02/11/2016  Elsevier Interactive Patient Education © 2017 [employer]

## 2018-03-17 NOTE — Progress Notes (Signed)
Subjective:    Patient ID: Debra Parker, female    DOB: Aug 15, 1985, 32 y.o.   MRN: [id number]  No chief complaint on file.   HPI Patient was seen today for acute concern.  Pt endorses left foot pain x1 week.  Pain is noted along posterior L heel and Achilles tendon.  Pt notes a small bump at the base of the Achilles.  Pt does not recall injury, hearing a pop, or feeling a tear.  Tried ibuprofen and ice.  Pt works in a clinic, is on her feet.  Pain is better with rest.  Past Medical History:  Diagnosis Date  . Bronchitis   . Diverticulitis 2019  . Hypertension    when pregnant  . Migraines     Allergies  Allergen Reactions  . Sulfa Antibiotics Other (See Comments)    Not sure.    ROS General: Denies fever, chills, night sweats, changes in weight, changes in appetite HEENT: Denies headaches, ear pain, changes in vision, rhinorrhea, sore throat CV: Denies CP, palpitations, SOB, orthopnea Pulm: Denies SOB, cough, wheezing GI: Denies abdominal pain, nausea, vomiting, diarrhea, constipation GU: Denies dysuria, hematuria, frequency, vaginal discharge Msk: Denies muscle cramps, joint pains  +L heel/achilles pain Neuro: Denies weakness, numbness, tingling Skin: Denies rashes, bruising Psych: Denies depression, anxiety, hallucinations    Objective:    Blood pressure 116/62, pulse 76, temperature 98.2 F (36.8 C), temperature source Oral, weight 262 lb (118.8 kg), SpO2 98 %.  Gen. Pleasant, well-nourished, in no distress, normal affect   HEENT: [place]/AT, face symmetric, no scleral icterus, PERRLA, nares patent without drainage Lungs: no accessory muscle use, CTAB Cardiovascular: RRR, no peripheral edema Musculoskeletal: 1 cm nodule 2 cm superior to calcaneous at insertion of achilles.  TTP and thickening of achilles.  Pain with plantar flexion of foot.  Foot cool to touch, DP and PT pulses intact.  No pain with eversion or inversion of the left foot.  Right foot normal.  No  cyanosis or clubbing, normal tone Neuro:  A&Ox3, CN II-XII intact, normal gait Skin:  Warm, no lesions/ rash  Wt Readings from Last 3 Encounters:  03/03/18 262 lb 9.6 oz (119.1 kg)  02/01/18 268 lb 12.8 oz (121.9 kg)  01/21/18 265 lb 4 oz (120.3 kg)    Lab Results  Component Value Date   WBC 8.1 10/18/2017   HGB 12.3 10/18/2017   HCT 37.0 10/18/2017   PLT 276 10/18/2017   GLUCOSE 82 02/01/2018   CHOL 158 02/01/2018   TRIG 99.0 02/01/2018   HDL 46.40 02/01/2018   LDLCALC 92 02/01/2018   ALT 17 02/01/2018   AST 16 02/01/2018   NA 137 02/01/2018   K 4.1 02/01/2018   CL 101 02/01/2018   CREATININE 0.58 02/01/2018   BUN 9 02/01/2018   CO2 29 02/01/2018   TSH 1.63 02/01/2018    Assessment/Plan:  Achilles tendinitis, left leg  -will obtain xrays -discussed rest, ice, etc -consider CAM boot - Plan: predniSONE (DELTASONE) 10 MG tablet, meloxicam (MOBIC) 7.5 MG tablet  Left foot pain  -discussed rest, ice -will obtain xrays - Plan: DG Foot Complete Left, DG Foot Complete Left  F/u prn  Abbe AmsterdamShannon Zach Tietje, MD

## 2018-03-18 ENCOUNTER — Telehealth: Payer: Self-pay | Admitting: Family Medicine

## 2018-03-18 ENCOUNTER — Encounter: Payer: Self-pay | Admitting: Family Medicine

## 2018-03-18 NOTE — Telephone Encounter (Signed)
Images were just finalized today. No result notes yet

## 2018-03-18 NOTE — Telephone Encounter (Signed)
Copied from CRM [id number]. Topic: Quick Communication - See Telephone Encounter >> Mar 18, 2018  2:10 PM Herby AbrahamJohnson, Shiquita C wrote: CRM for notification. See Telephone encounter for: 03/18/18.  Pt called in requesting her imaging results.   CB: [phone]

## 2018-03-19 NOTE — Telephone Encounter (Signed)
Looks like she saw Dr. Salomon FickBanks and xray was ordered and results reviewed by her and message created regarding results. There is note that she reviewed results on portal but not sure if more is needed at this time? Please just make sure.

## 2018-03-23 NOTE — Telephone Encounter (Signed)
Called pt left a detailed message on pt X-ray results, Advised pt on dr Salomon FickBanks recommendation to continue taking prednisone, NSAID, and rest.

## 2018-03-24 ENCOUNTER — Ambulatory Visit: Payer: Self-pay

## 2018-03-24 NOTE — Telephone Encounter (Signed)
Patient called and says her daughter was diagnosed with the flu today and her pediatrician recommended her to call the PCP to be prescribed Tamiflu to prevent the flu. I asked about symptoms, she denies symptoms. I advised I will send to Dr. Hassan RowanKoberlein and someone will call with her recommendation, she verbalized understanding.  [facility] [phone] - [place], [place] - [address] AT [facility] [address] [phone] (Phone) [phone] (Fax)     Reason for Disposition . [1] Influenza EXPOSURE within last 48 hours (2 days) AND [2] NOT HIGH RISK AND [3] strongly requests antiviral medication  Answer Assessment - Initial Assessment Questions 1. TYPE of EXPOSURE: "How were you exposed?" (e.g., close contact, not a close contact)     Close contact with daughter 2. DATE of EXPOSURE: "When did the exposure occur?" (e.g., hour, days, weeks)     Diagnosed today with the flu 3. PREGNANCY: "Is there any chance you are pregnant?" "When was your last menstrual period?"     No, IUD 4. HIGH RISK for COMPLICATIONS: "Do you have any heart or lung problems? Do you have a weakened immune system?" (e.g., CHF, COPD, asthma, HIV positive, chemotherapy, renal failure, diabetes mellitus, sickle cell anemia)     No 5. SYMPTOMS: "Do you have any symptoms?" (e.g., cough, fever, sore throat, difficulty breathing).     No  Protocols used: INFLUENZA EXPOSURE-A-AH

## 2018-03-26 MED ORDER — OSELTAMIVIR PHOSPHATE 75 MG PO CAPS: 75.0000 mg | ORAL_CAPSULE | Freq: Every day | ORAL | 0 refills | Status: DC

## 2018-03-26 NOTE — Addendum Note (Signed)
Addended by: Solon AugustaHEDRICK, Caedyn Raygoza on: 03/26/2018 01:12 PM   Modules accepted: Orders

## 2018-03-26 NOTE — Telephone Encounter (Signed)
Sorry note came in yesterday when I was not here. Please see if she has any symptoms at this point before we send in the prophylactic dosing. If no symptoms then it is ok to send in tamiflu 75mg   Daily x 7 days Dispense 7 no refills.

## 2018-03-26 NOTE — Telephone Encounter (Signed)
Patient is not having any symptoms. Rx sent

## 2018-04-07 LAB — HM PAP SMEAR: HM Pap smear: NORMAL

## 2018-04-19 ENCOUNTER — Other Ambulatory Visit: Payer: Self-pay | Admitting: Family Medicine

## 2018-04-19 DIAGNOSIS — M7662 Achilles tendinitis, left leg: Secondary | ICD-10-CM

## 2018-04-19 NOTE — Telephone Encounter (Signed)
Dr. Koberlein pt 

## 2018-04-19 NOTE — Telephone Encounter (Signed)
Last fill 03/17/18, filled by Dr. Salomon Fick Last OV12/11/19 with Dr. Salomon Fick 03/03/18 with Dr. Hassan Rowan.

## 2018-05-04 ENCOUNTER — Encounter: Payer: Self-pay | Admitting: Family Medicine

## 2018-05-04 ENCOUNTER — Ambulatory Visit: Payer: 59 | Admitting: Family Medicine

## 2018-05-04 VITALS — BP 128/62 | HR 101 | Temp 99.4°F | Wt 261.1 lb

## 2018-05-04 DIAGNOSIS — J019 Acute sinusitis, unspecified: Secondary | ICD-10-CM | POA: Diagnosis not present

## 2018-05-04 MED ORDER — BENZONATATE 200 MG PO CAPS: 200.0000 mg | ORAL_CAPSULE | Freq: Two times a day (BID) | ORAL | 0 refills | Status: DC | PRN

## 2018-05-04 MED ORDER — DOXYCYCLINE HYCLATE 100 MG PO CAPS: 100.0000 mg | ORAL_CAPSULE | Freq: Two times a day (BID) | ORAL | 0 refills | Status: AC

## 2018-05-04 NOTE — Progress Notes (Signed)
   Subjective:    Patient ID: Debra Parker, female    DOB: [birth year], 33 y.o.   MRN: [id number]  HPI Here for 2 days of fever, stuffy head, PND, and coughing up green sputum.    Review of Systems  Constitutional: Positive for fever.  HENT: Positive for congestion, postnasal drip, sinus pressure and sinus pain. Negative for sore throat.   Eyes: Negative.   Respiratory: Positive for cough.        Objective:   Physical Exam Constitutional:      Appearance: Normal appearance.  HENT:     Right Ear: Tympanic membrane and ear canal normal.     Left Ear: Tympanic membrane and ear canal normal.     Nose: Nose normal.     Mouth/Throat:     Pharynx: Oropharynx is clear.  Eyes:     Conjunctiva/sclera: Conjunctivae normal.  Pulmonary:     Effort: Pulmonary effort is normal. No respiratory distress.     Breath sounds: Normal breath sounds. No stridor. No wheezing, rhonchi or rales.  Lymphadenopathy:     Cervical: No cervical adenopathy.  Neurological:     Mental Status: She is alert.           Assessment & Plan:  Sinusitis, treat with Doxycycline. Written out of work today and tomorrow. Gershon Crane, MD

## 2018-05-05 ENCOUNTER — Emergency Department (HOSPITAL_COMMUNITY)
Admission: EM | Admit: 2018-05-05 | Discharge: 2018-05-05 | Disposition: A | Discharge status: Home / Self Care | Payer: 59 | Attending: Emergency Medicine | Admitting: Emergency Medicine

## 2018-05-05 ENCOUNTER — Emergency Department (HOSPITAL_COMMUNITY): Payer: 59

## 2018-05-05 ENCOUNTER — Encounter (HOSPITAL_COMMUNITY): Payer: Self-pay | Admitting: Emergency Medicine

## 2018-05-05 ENCOUNTER — Other Ambulatory Visit: Payer: Self-pay

## 2018-05-05 DIAGNOSIS — Y998 Other external cause status: Secondary | ICD-10-CM | POA: Insufficient documentation

## 2018-05-05 DIAGNOSIS — S199XXA Unspecified injury of neck, initial encounter: Secondary | ICD-10-CM | POA: Diagnosis not present

## 2018-05-05 DIAGNOSIS — M7918 Myalgia, other site: Secondary | ICD-10-CM | POA: Diagnosis not present

## 2018-05-05 DIAGNOSIS — S161XXA Strain of muscle, fascia and tendon at neck level, initial encounter: Secondary | ICD-10-CM | POA: Insufficient documentation

## 2018-05-05 DIAGNOSIS — M25552 Pain in left hip: Secondary | ICD-10-CM | POA: Diagnosis not present

## 2018-05-05 DIAGNOSIS — Z79899 Other long term (current) drug therapy: Secondary | ICD-10-CM | POA: Diagnosis not present

## 2018-05-05 DIAGNOSIS — S4992XA Unspecified injury of left shoulder and upper arm, initial encounter: Secondary | ICD-10-CM | POA: Diagnosis not present

## 2018-05-05 DIAGNOSIS — I10 Essential (primary) hypertension: Secondary | ICD-10-CM | POA: Diagnosis not present

## 2018-05-05 DIAGNOSIS — S8992XA Unspecified injury of left lower leg, initial encounter: Secondary | ICD-10-CM | POA: Diagnosis not present

## 2018-05-05 DIAGNOSIS — M25562 Pain in left knee: Secondary | ICD-10-CM | POA: Diagnosis not present

## 2018-05-05 DIAGNOSIS — Y92410 Unspecified street and highway as the place of occurrence of the external cause: Secondary | ICD-10-CM | POA: Insufficient documentation

## 2018-05-05 DIAGNOSIS — Y9389 Activity, other specified: Secondary | ICD-10-CM | POA: Insufficient documentation

## 2018-05-05 DIAGNOSIS — M542 Cervicalgia: Secondary | ICD-10-CM | POA: Diagnosis not present

## 2018-05-05 MED ORDER — CYCLOBENZAPRINE HCL 5 MG PO TABS: 5.0000 mg | ORAL_TABLET | Freq: Three times a day (TID) | ORAL | 0 refills | Status: DC | PRN

## 2018-05-05 MED ORDER — IBUPROFEN 800 MG PO TABS
800.0000 mg | ORAL_TABLET | Freq: Once | ORAL | Status: AC
  Administered 2018-05-05: 800 mg via ORAL
  Filled 2018-05-05: qty 1

## 2018-05-05 MED ORDER — IBUPROFEN 600 MG PO TABS: 600.0000 mg | ORAL_TABLET | Freq: Four times a day (QID) | ORAL | 0 refills | Status: DC | PRN

## 2018-05-05 NOTE — ED Notes (Signed)
Pt ambulatory to waiting room. Pt verbalized understanding of discharge instructions.   
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
## 2018-05-07 ENCOUNTER — Ambulatory Visit: Payer: Self-pay | Admitting: Family Medicine

## 2018-05-07 ENCOUNTER — Encounter: Payer: Self-pay | Admitting: Internal Medicine

## 2018-05-07 ENCOUNTER — Ambulatory Visit: Payer: 59 | Admitting: Internal Medicine

## 2018-05-07 VITALS — BP 110/70 | HR 109 | Temp 98.3°F | Wt 263.2 lb

## 2018-05-07 DIAGNOSIS — R110 Nausea: Secondary | ICD-10-CM

## 2018-05-07 DIAGNOSIS — M545 Low back pain, unspecified: Secondary | ICD-10-CM

## 2018-05-07 MED ORDER — ONDANSETRON 4 MG PO TBDP: 4.0000 mg | ORAL_TABLET | Freq: Three times a day (TID) | ORAL | 0 refills | Status: DC | PRN
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
## 2018-05-10 ENCOUNTER — Encounter: Payer: Self-pay | Admitting: Family Medicine

## 2018-05-10 NOTE — Telephone Encounter (Signed)
Dr. Fry please advise. Thanks  

## 2018-05-11 NOTE — Telephone Encounter (Signed)
I agree. Call in a Medrol dose pack

## 2018-05-12 MED ORDER — METHYLPREDNISOLONE 4 MG PO TBPK: ORAL_TABLET | ORAL | 0 refills | Status: DC

## 2018-06-18 ENCOUNTER — Telehealth: Payer: Self-pay
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
## 2018-06-23 ENCOUNTER — Encounter: Payer: Self-pay | Admitting: Family Medicine

## 2018-06-23 ENCOUNTER — Ambulatory Visit: Payer: 59 | Admitting: Family Medicine

## 2018-06-23 ENCOUNTER — Other Ambulatory Visit: Payer: Self-pay

## 2018-06-23 VITALS — BP 100/68 | HR 88 | Temp 98.8°F | Ht 65.0 in | Wt 259.8 lb

## 2018-06-23 DIAGNOSIS — R51 Headache: Secondary | ICD-10-CM

## 2018-06-23 DIAGNOSIS — R519 Headache, unspecified: Secondary | ICD-10-CM

## 2018-06-23 MED ORDER — PHENTERMINE HCL 37.5 MG PO CAPS: 37.5000 mg | ORAL_CAPSULE | ORAL | 1 refills | Status: DC

## 2018-06-23 MED ORDER — TOPIRAMATE 25 MG PO TABS: 25.0000 mg | ORAL_TABLET | Freq: Every day | ORAL | 2 refills | Status: DC

## 2018-06-23 NOTE — Patient Instructions (Signed)
Keep working on getting 30 minutes of cardiac exercise daily (more is better).   Trial of phentermine and topamax. Update me on weight in 1 months time. Let me know sooner if any concerns/problems.
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
## 2018-06-30 ENCOUNTER — Encounter: Payer: Self-pay | Admitting: Family Medicine

## 2018-06-30 NOTE — Telephone Encounter (Signed)
Offer web visit on Friday; I don't think I would recommend starting something now as it sounds like sx are mild; I don't want to overdo the antibiotics for her. I think she needs to monitor and then we can have virtual visit Friday if needed. Make sure monitoring for fever/worsening. COVID is in community now so she doesn't have to travel or have exposure to have it. In meanwhile; symptom treating (ie treating allergies, sinus rinses, etc).

## 2018-06-30 NOTE — Telephone Encounter (Signed)
Please get timeline of symptoms. Offer web visit if she wants.   If she completely cleared from previous infection (which I believe that she had at last appointment with me) then this may be something new. I hate to pump system with antibiotics in case not needed. Also, get sx detail (if all sinuses, then we could discuss consideration for antibiotic and timeline); if cough; there is community spread of COVID at this point so we would need to discuss further.

## 2018-07-02 ENCOUNTER — Telehealth: Payer: Self-pay | Admitting: Family Medicine

## 2018-07-02 ENCOUNTER — Encounter: Payer: Self-pay | Admitting: Family Medicine

## 2018-07-02 ENCOUNTER — Other Ambulatory Visit: Payer: Self-pay

## 2018-07-02 ENCOUNTER — Ambulatory Visit (INDEPENDENT_AMBULATORY_CARE_PROVIDER_SITE_OTHER): Payer: 59 | Admitting: Family Medicine

## 2018-07-02 DIAGNOSIS — R059 Cough, unspecified: Secondary | ICD-10-CM

## 2018-07-02 DIAGNOSIS — R05 Cough: Secondary | ICD-10-CM

## 2018-07-02 MED ORDER — MONTELUKAST SODIUM 10 MG PO TABS: 10.0000 mg | ORAL_TABLET | Freq: Every day | ORAL | 3 refills | Status: DC

## 2018-07-02 MED ORDER — AZITHROMYCIN 250 MG PO TABS: ORAL_TABLET | ORAL | 0 refills | Status: DC

## 2018-07-02 NOTE — Progress Notes (Signed)
Virtual Visit via Telephone Note  I connected with Debra Parker on 07/02/18 at  1:00 PM EDT by telephone and verified that I am speaking with the correct person using two identifiers.   Location patient: work Catering manager: work office Participants present for the call: patient, provider Patient did not have a visit in the prior 7 days to address this/these issue(s).   History of Present Illness: Patient complains of chest congestion and cough since Monday.  She states that she has had on and off sick symptoms since November.  She feels like she recurrently is getting sinus issues as well as cough.  She feels it may be related to her husband and stepdaughter vaping in the house.  She feels that since they have started this she has more long irritation.  During this week, she has had chest congestion that seems to be breaking up to some degree.  She has had a cough that is productive of green phlegm.  She has a little bit of a runny nose.  She has not had any fevers.  She states she is not feeling "bad" but she is definitely not feeling 100%.  She denies any shortness of breath or wheezing.  She feels like things are settling in her chest more.  She denies any body aches or chills.  She denies headache.  She has no current sinus pressure or pain.  She worries that w recurrently being sick that ma be she needs to be evaluated by a pulmonary specialist.  He has not had breathing problems in the past.  She has no history of asthma.  She has never needed inhalers in the past.   Observations/Objective: Patient sounds nasally congested.  She does have a hacking cough that sounds bark-like at points. I do not appreciate any SOB. Speech and thought processing are grossly intact. Patient reported vitals: no fever  Assessment and Plan: 1. Cough I did advise patient given current coronavirus epidemic, that I would recommend she not to be at work due to her cough.  Although she has not had fevers,  I think it is reasonable for her to monitor her symptoms and rest over the weekend.  I will check in with her on Monday to see how she is feeling.  We did discuss frequent antibiotic use over the last few months.  I advised her to monitor her symptoms over the next 24 to 48 hours.  If not noting some improvement could start antibiotic, but I did recommend monitoring as I suspect this may be a viral illness.  On the other hand, she has had easily exacerbated coughs over the last few months.  There may be some relation to her exposure to vaping at home.  I did recommend avoiding this when possible.  Additionally we discussed that she may have more reactive lung issue, and it may be worthwhile to maximize any allergy control, so Singulair was sent in for her and an antihistamine was recommended.  We also discussed consideration for Flonase.  If worsening of symptoms over the weekend including but not limited to: Shortness of breath wheezing or fever, I did recommend that she call for further advice.   Follow Up Instructions:  Return if symptoms worsen or fail to improve.   I did not refer this patient for an OV in the next 24 hours for this/these issue(s).  I discussed the assessment and treatment plan with the patient. The patient was provided an opportunity to ask questions and  all were answered. The patient agreed with the plan and demonstrated an understanding of the instructions.   The patient was advised to call back or seek an in-person evaluation if the symptoms worsen or if the condition fails to improve as anticipated.  I provided 20 minutes of non-face-to-face time during this encounter.   Theodis Shove, MD

## 2018-07-02 NOTE — Telephone Encounter (Signed)
appointment made with Dr. Hassan Rowan

## 2018-07-02 NOTE — Telephone Encounter (Signed)
See if you can get her to webex? She is available now. I can talk right now with her.

## 2018-07-02 NOTE — Telephone Encounter (Signed)
See phone encounter note same day.

## 2018-07-05 ENCOUNTER — Encounter: Payer: Self-pay | Admitting: Family Medicine

## 2018-07-06 NOTE — Telephone Encounter (Signed)
Please advise. Patient has only been taking medication for 2 days. Should we have her do a webex or give it more time?

## 2018-07-07 ENCOUNTER — Encounter: Payer: Self-pay | Admitting: Family Medicine

## 2018-07-07 ENCOUNTER — Other Ambulatory Visit: Payer: Self-pay | Admitting: Family Medicine

## 2018-07-07 ENCOUNTER — Telehealth: Payer: Self-pay | Admitting: Family Medicine

## 2018-07-07 MED ORDER — BENZONATATE 100 MG PO CAPS: 100.0000 mg | ORAL_CAPSULE | Freq: Two times a day (BID) | ORAL | 0 refills | Status: DC | PRN

## 2018-07-07 MED ORDER — ALBUTEROL SULFATE HFA 108 (90 BASE) MCG/ACT IN AERS: 2.0000 | INHALATION_SPRAY | Freq: Four times a day (QID) | RESPIRATORY_TRACT | 1 refills | Status: DC | PRN

## 2018-07-07 MED ORDER — E-Z SPACER DEVI: 2 refills | Status: DC

## 2018-07-07 MED ORDER — HYDROCODONE-HOMATROPINE 5-1.5 MG/5ML PO SYRP: 5.0000 mL | ORAL_SOLUTION | Freq: Four times a day (QID) | ORAL | 0 refills | Status: DC | PRN

## 2018-07-07 NOTE — Progress Notes (Signed)
I spoke with patient by phone.  She has had worsening of cough symptoms since we last talked.  She did not note improvement with starting the Z-Pak.  She does feel somewhat short of breath, but mostly when she is coughing, or if she is very physically active.  She states that the symptoms are very similar to when she has had bronchitis in the past.  She is gotten bronchitis fairly regularly since she was a child.  She has been on inhalers in the past for bronchitis, but nothing recently.  She has had benefit from cough syrups and Tessalon Perles during bronchitis flares in the past.    She does state that she had a suspected fever last night and woke up in a sweat.  Her thermometer at home is not working, so she is not certain of actual temperature.  She has not felt feverish today.  She denies significant fatigue.  She denies loss of appetite.  She has been working on increasing fluids and rest.  Health at work has been in touch with her on a daily basis since her and I last talked.  They state that she has to be symptom free for 72 hours before being able to return to work.  We discussed options at this point for her and I recommended she ask them about potential CoVid testing.  I am not sure what availability we have in the system right now for this, but she does wish to get back to work and I think this would help in that process.  However, we did discuss that this cough would be expected to linger for at least a couple of weeks.  I am happy to fill out any paperwork needed for her since she will be missing work during this time.  She will keep me posted after discussion with health at work tomorrow.  I have sent in an albuterol inhaler with spacing chamber for her to use.  I have sent in a refill of Tessalon Perles as well as cough syrup that she has previously tolerated.  I did advise her that if she is becoming more short of breath or her symptoms are worsening she does need to be seen.

## 2018-07-07 NOTE — Telephone Encounter (Signed)
Spoke with patient by phone

## 2018-07-07 NOTE — Telephone Encounter (Signed)
Copied from CRM [id number]. Topic: General - Other >> Jul 07, 2018 11:56 AM Tamela Oddi wrote: Reason for CRM: Patient called to request that the nurse or doctor give her a call regarding an update on her bronchitis.  Please advise and call patient at [phone]

## 2018-07-08 ENCOUNTER — Encounter: Payer: Self-pay | Admitting: Family Medicine

## 2018-07-08 NOTE — Telephone Encounter (Signed)
Dr.Koberlein spoke with the patient yesterday.

## 2018-07-09 NOTE — Telephone Encounter (Signed)
Left message for patient to call back. CRM created 

## 2018-07-13 ENCOUNTER — Telehealth: Payer: Self-pay

## 2018-07-13 ENCOUNTER — Encounter: Payer: Self-pay | Admitting: Family Medicine

## 2018-07-13 ENCOUNTER — Other Ambulatory Visit: Payer: Self-pay

## 2018-07-13 ENCOUNTER — Other Ambulatory Visit: Payer: Self-pay | Admitting: Family Medicine

## 2018-07-13 ENCOUNTER — Ambulatory Visit (INDEPENDENT_AMBULATORY_CARE_PROVIDER_SITE_OTHER): Payer: 59 | Admitting: Family Medicine

## 2018-07-13 DIAGNOSIS — J019 Acute sinusitis, unspecified: Secondary | ICD-10-CM | POA: Diagnosis not present

## 2018-07-13 MED ORDER — LEVOFLOXACIN 500 MG PO TABS: 500.0000 mg | ORAL_TABLET | Freq: Every day | ORAL | 0 refills | Status: AC

## 2018-07-13 MED ORDER — PROMETHAZINE HCL 25 MG PO TABS: 25.0000 mg | ORAL_TABLET | ORAL | 0 refills | Status: DC | PRN

## 2018-07-13 NOTE — Progress Notes (Signed)
Subjective:    Patient ID: Debra Parker, female    DOB: 03-28-1986, 33 y.o.   MRN: [id number]  HPI Virtual Visit via Video Note  I connected with the patient on 07/13/18 at  1:00 PM EDT by a video enabled telemedicine application and verified that I am speaking with the correct person using two identifiers.  Location patient: home Location provider:work or home office Persons participating in the virtual visit: patient, provider  I discussed the limitations of evaluation and management by telemedicine and the availability of in person appointments. The patient expressed understanding and agreed to proceed.   HPI: She is complaining of long standing URI symptoms including a cough that produces green sputum, a ST, and PND. No fever or body aches or SOB. No NVD. She was given a Zpack about 10 days ago, and she felt better for about a week. But now she feels like she did 2 weeks ago. She has been at home since her work will not allow her to return until 3 days after her symptoms resolve. She is using Benzonatate and an inhaler 2-3 times a day. Hycodan helps her sleep at night.    ROS: See pertinent positives and negatives per HPI.  Past Medical History:  Diagnosis Date  . Bronchitis   . Diverticulitis 2019  . Hypertension    when pregnant  . Migraines     Past Surgical History:  Procedure Laterality Date  . CESAREAN SECTION  2007  . fatty pocket removed  1999  . WISDOM TOOTH EXTRACTION  2000    Family History  Problem Relation Age of Onset  . Diabetes Father   . Hyperlipidemia Father   . Early death Sister        stillborn  . Heart disease Brother   . Heart attack Brother 40  . Arthritis Maternal Grandmother   . Diabetes Maternal Grandmother   . Heart attack Maternal Grandmother   . Throat cancer Maternal Grandfather   . Diabetes Paternal Grandfather   . Stroke Paternal Grandfather      Current Outpatient Medications:  .  acetaminophen (TYLENOL) 500 MG  tablet, Take 500 mg by mouth every 6 (six) hours as needed for mild pain., Disp: , Rfl:  .  albuterol (PROAIR HFA) 108 (90 Base) MCG/ACT inhaler, Inhale 2 puffs into the lungs every 6 (six) hours as needed for wheezing or shortness of breath., Disp: 1 Inhaler, Rfl: 1 .  benzonatate (TESSALON) 100 MG capsule, Take 1 capsule (100 mg total) by mouth 2 (two) times daily as needed for cough., Disp: 20 capsule, Rfl: 0 .  HYDROcodone-homatropine (HYCODAN) 5-1.5 MG/5ML syrup, Take 5 mLs by mouth every 6 (six) hours as needed for cough., Disp: 100 mL, Rfl: 0 .  ibuprofen (ADVIL,MOTRIN) 600 MG tablet, Take 1 tablet (600 mg total) by mouth every 6 (six) hours as needed., Disp: 30 tablet, Rfl: 0 .  montelukast (SINGULAIR) 10 MG tablet, Take 1 tablet (10 mg total) by mouth at bedtime., Disp: 30 tablet, Rfl: 3 .  phentermine 37.5 MG capsule, Take 1 capsule (37.5 mg total) by mouth every morning., Disp: 30 capsule, Rfl: 1 .  Spacer/Aero-Holding Chambers (E-Z SPACER) inhaler, Use as instructed, Disp: 1 each, Rfl: 2 .  topiramate (TOPAMAX) 25 MG tablet, Take 1 tablet (25 mg total) by mouth daily., Disp: 30 tablet, Rfl: 2  EXAM:  VITALS per patient if applicable:  GENERAL: alert, oriented, appears well and in no acute distress  HEENT: atraumatic, conjunttiva  clear, no obvious abnormalities on inspection of external nose and ears  NECK: normal movements of the head and neck  LUNGS: on inspection no signs of respiratory distress, breathing rate appears normal, no obvious gross SOB, gasping or wheezing  CV: no obvious cyanosis  MS: moves all visible extremities without noticeable abnormality  PSYCH/NEURO: pleasant and cooperative, no obvious depression or anxiety, speech and thought processing grossly intact  ASSESSMENT AND PLAN: This is consistent with a partially treated sinusitis. We will give her 10 days of Levaquin. Add Mucinex bid. Recheck prn. Gershon CraneStephen Razia Screws, MD  Discussed the following assessment and  plan:  No diagnosis found.     I discussed the assessment and treatment plan with the patient. The patient was provided an opportunity to ask questions and all were answered. The patient agreed with the plan and demonstrated an understanding of the instructions.   The patient was advised to call back or seek an in-person evaluation if the symptoms worsen or if the condition fails to improve as anticipated.     Review of Systems     Objective:   Physical Exam        Assessment & Plan:

## 2018-07-13 NOTE — Telephone Encounter (Signed)
Dr Fry please advise. thanks 

## 2018-07-13 NOTE — Telephone Encounter (Signed)
Virtual appointment has been made  

## 2018-07-13 NOTE — Telephone Encounter (Signed)
-----   Message from Wynn Banker, MD sent at 07/13/2018 10:16 AM EDT ----- Can you call her and touch base? She has been [place] me and she has had this lingering cough not getting better (she had some SOB last week). Thought initially it was just bronchitis. She works at other [employer] so she is at home until she is feeling better. She is not saying throat is really sore.   She may need a hands on visit at this point through urgent care. Just thought maybe if someone had opening on schedule it might be nice to get more details and see if there was anything else we can offer symptom wise at this point.

## 2018-07-13 NOTE — Telephone Encounter (Signed)
I sent in some Phenergan

## 2018-07-15 NOTE — Telephone Encounter (Signed)
Dr. Clent Ridges please advise on refill.  Pt has the 100 mg on her list.

## 2018-07-15 NOTE — Telephone Encounter (Signed)
Call in 200 mg Benzonatate to take bid prn cough, #60 with no rf

## 2018-07-17 ENCOUNTER — Encounter: Payer: Self-pay | Admitting: Family Medicine

## 2018-07-19 ENCOUNTER — Encounter: Payer: Self-pay | Admitting: Family Medicine

## 2018-07-21 ENCOUNTER — Other Ambulatory Visit: Payer: Self-pay | Admitting: Family Medicine

## 2018-07-21 ENCOUNTER — Telehealth: Payer: Self-pay | Admitting: *Deleted

## 2018-07-21 DIAGNOSIS — R059 Cough, unspecified: Secondary | ICD-10-CM

## 2018-07-21 DIAGNOSIS — R05 Cough: Secondary | ICD-10-CM

## 2018-07-21 DIAGNOSIS — R42 Dizziness and giddiness: Secondary | ICD-10-CM

## 2018-07-21 DIAGNOSIS — R0602 Shortness of breath: Secondary | ICD-10-CM

## 2018-07-21 NOTE — Telephone Encounter (Signed)
Questions for Screening COVID-19  Symptom onset: N/A   Travel or Contacts: No   During this illness, did/does the patient experience any of the following symptoms? Fever >100.55F []   Yes [x]   No []   Unknown Subjective fever (felt feverish) []   Yes [x]   No []   Unknown Chills []   Yes [x]   No []   Unknown Muscle aches (myalgia) []   Yes [x]   No []   Unknown Runny nose (rhinorrhea) []   Yes [x]   No []   Unknown Sore throat []   Yes [x]   No []   Unknown Cough (new onset or worsening of chronic cough) []   Yes [x]   No []   Unknown Shortness of breath (dyspnea) [x]   Yes []   No []   Unknown Nausea or vomiting []   Yes [x]   No []   Unknown Headache []   Yes [x]   No []   Unknown Abdominal pain  []   Yes [x]   No []   Unknown Diarrhea (?3 loose/looser than normal stools/24hr period) []   Yes [x]   No []   Unknown Other, specify:  Patient risk factors: Smoker? []   Current []   Former [x]   Never If female, currently pregnant? []   Yes [x]   No  Patient Active Problem List   Diagnosis Date Noted  . Morbid obesity (HCC) 02/01/2018    Plan:   Patient screening is negative. Patient also reports she has been tested for COVID 19 and it was negative   Note: Referral to telemedicine is an appropriate alternative disposition for higher risk but stable. [facility] Tele[facility]/e-Visit: [phone].

## 2018-07-21 NOTE — Telephone Encounter (Signed)
Can you please call [facility] to schedule lab/xray for her? Thanks!

## 2018-07-22 ENCOUNTER — Other Ambulatory Visit (INDEPENDENT_AMBULATORY_CARE_PROVIDER_SITE_OTHER): Payer: 59

## 2018-07-22 ENCOUNTER — Other Ambulatory Visit: Payer: Self-pay

## 2018-07-22 ENCOUNTER — Ambulatory Visit (INDEPENDENT_AMBULATORY_CARE_PROVIDER_SITE_OTHER): Payer: 59

## 2018-07-22 DIAGNOSIS — R05 Cough: Secondary | ICD-10-CM | POA: Diagnosis not present

## 2018-07-22 DIAGNOSIS — R42 Dizziness and giddiness: Secondary | ICD-10-CM

## 2018-07-22 DIAGNOSIS — R059 Cough, unspecified: Secondary | ICD-10-CM

## 2018-07-22 DIAGNOSIS — R0602 Shortness of breath: Secondary | ICD-10-CM

## 2018-07-22 LAB — COMPREHENSIVE METABOLIC PANEL
ALT: 13 U/L (ref 0–35)
AST: 13 U/L (ref 0–37)
Albumin: 3.9 g/dL (ref 3.5–5.2)
Alkaline Phosphatase: 61 U/L (ref 39–117)
BUN: 13 mg/dL (ref 6–23)
CO2: 22 mEq/L (ref 19–32)
Calcium: 8.7 mg/dL (ref 8.4–10.5)
Chloride: 105 mEq/L (ref 96–112)
Creatinine, Ser: 0.72 mg/dL (ref 0.40–1.20)
GFR: 92.11 mL/min (ref >60.00)
Glucose, Bld: 86 mg/dL (ref 70–99)
Potassium: 3.8 mEq/L (ref 3.5–5.1)
Sodium: 137 mEq/L (ref 135–145)
Total Bilirubin: 0.4 mg/dL (ref 0.2–1.2)
Total Protein: 6.8 g/dL (ref 6.0–8.3)

## 2018-07-22 LAB — CBC WITH DIFFERENTIAL/PLATELET
Basophils Absolute: 0 10*3/uL (ref 0.0–0.1)
Basophils Relative: 0.5 % (ref 0.0–3.0)
Eosinophils Absolute: 0.1 10*3/uL (ref 0.0–0.7)
Eosinophils Relative: 1.6 % (ref 0.0–5.0)
HCT: 38.6 % (ref 36.0–46.0)
Hemoglobin: 12.9 g/dL (ref 12.0–15.0)
Lymphocytes Relative: 43.3 % (ref 12.0–46.0)
Lymphs Abs: 2.6 10*3/uL (ref 0.7–4.0)
MCHC: 33.4 g/dL (ref 30.0–36.0)
MCV: 86 fl (ref 78.0–100.0)
Monocytes Absolute: 0.4 10*3/uL (ref 0.1–1.0)
Monocytes Relative: 6.8 % (ref 3.0–12.0)
Neutro Abs: 2.8 10*3/uL (ref 1.4–7.7)
Neutrophils Relative %: 47.8 % (ref 43.0–77.0)
Platelets: 234 10*3/uL (ref 150.0–400.0)
RBC: 4.49 Mil/uL (ref 3.87–5.11)
RDW: 13.1 % (ref 11.5–15.5)
WBC: 5.9 10*3/uL (ref 4.0–10.5)

## 2018-07-23 ENCOUNTER — Encounter: Payer: Self-pay | Admitting: Family Medicine

## 2018-07-24 LAB — D-DIMER, QUANTITATIVE: D-Dimer, Quant: 0.32 mcg/mL FEU (ref <0.50)

## 2018-07-26 NOTE — Telephone Encounter (Signed)
Patient has been in touch through mychart.

## 2018-08-02 MED FILL — TOPIRAMATE 25 MG TABLET: 25 | 30 days supply | Qty: 30 | Fill #0

## 2018-08-02 MED FILL — MONTELUKAST SOD 10 MG TAB: 10 | 90 days supply | Qty: 90 | Fill #0

## 2018-09-10 ENCOUNTER — Encounter: Payer: Self-pay | Admitting: Family Medicine

## 2018-09-10 ENCOUNTER — Other Ambulatory Visit: Payer: Self-pay | Admitting: Family Medicine

## 2018-09-10 DIAGNOSIS — R519 Headache, unspecified: Secondary | ICD-10-CM

## 2018-09-10 MED ORDER — TOPIRAMATE 25 MG PO TABS: 25.0000 mg | ORAL_TABLET | Freq: Every day | ORAL | 1 refills | Status: DC

## 2018-09-10 MED ORDER — PHENTERMINE HCL 37.5 MG PO CAPS: 37.5000 mg | ORAL_CAPSULE | ORAL | 1 refills | Status: DC

## 2018-09-10 MED FILL — TOPIRAMATE 25 MG TABLET: 25 | 90 days supply | Qty: 90 | Fill #0

## 2018-09-10 MED FILL — PHENTERMINE 37.5 MG CAPSULE: 37.5 | 30 days supply | Qty: 30 | Fill #0

## 2018-11-03 MED FILL — PHENTERMINE 37.5 MG CAPSULE: 37.5 | 30 days supply | Qty: 30 | Fill #1

## 2018-11-05 ENCOUNTER — Encounter: Payer: Self-pay | Admitting: Family Medicine

## 2018-11-05 NOTE — Telephone Encounter (Signed)
Please set up visit to discuss hair loss, phentermine refill.

## 2018-11-08 ENCOUNTER — Telehealth: Payer: Self-pay | Admitting: *Deleted

## 2018-11-08 NOTE — Telephone Encounter (Signed)
Copied from [place] [id number]. Topic: Appointment Scheduling - Scheduling Inquiry for Clinic >> Nov 08, 2018  9:12 AM Richardo Priest, NT wrote: Reason for CRM: Patient returned a call from Pricilla Holm in regards to setting up an appointment. Please advise.

## 2018-11-08 NOTE — Telephone Encounter (Signed)
Virtual visit approved by Dr Ethlyn Gallery and appt was scheduled for 8/5 at 3pm.

## 2018-11-10 ENCOUNTER — Other Ambulatory Visit: Payer: Self-pay

## 2018-11-10 ENCOUNTER — Telehealth (INDEPENDENT_AMBULATORY_CARE_PROVIDER_SITE_OTHER): Payer: 59 | Admitting: Family Medicine

## 2018-11-10 DIAGNOSIS — L659 Nonscarring hair loss, unspecified: Secondary | ICD-10-CM | POA: Diagnosis not present

## 2018-11-10 MED ORDER — MIRENA (52 MG) 20 MCG/24HR IU IUD: 1.0000 | INTRAUTERINE_SYSTEM | Freq: Once | INTRAUTERINE | 0 refills | Status: DC

## 2018-11-10 MED ORDER — PHENTERMINE HCL 37.5 MG PO CAPS: 37.5000 mg | ORAL_CAPSULE | ORAL | 0 refills | Status: DC

## 2018-11-10 NOTE — Progress Notes (Signed)
Virtual Visit via Video Note  I connected with Debra SparkHeather Marie Soliman  on 11/10/18 at  3:00 PM EDT by a video enabled telemedicine application and verified that I am speaking with the correct person using two identifiers.  Location patient: home Location provider:work or home office Persons participating in the virtual visit: patient, provider  I discussed the limitations of evaluation and management by telemedicine and the availability of in person appointments. The patient expressed understanding and agreed to proceed.   Debra Parker DOB: Dec 17, 1985 Encounter date: 11/10/2018  This is a 33 y.o. female who presents with No chief complaint on file.   History of present illness: Just got last refill done on phentermine. Hasn't weighed self in awhile. Weight is going down. Appetite has been decreasing. Last weight she checked was 243.   Working 40 hour weeks. Has been outside swabbing at COVID site.   Not sure why she is losing hair. Started taking vitamins to try and help with hair - has been taking these for over a month. Doesn't feel a lot more stressed, although is worried about COVID. Has noted hair loss with stress in the past. Feels like it was worse after illness. Noticing this all over. Tries to eat healthy with good variety.   No periods because she has IUD. Placed 6 years ago.  Still getting headaches some. Taking topamax daily. Tylenol helps when she does get headache. Has eye doc appointment in September.   Allergies have been ok.     Allergies  Allergen Reactions  . Sulfa Antibiotics Other (See Comments)    Not sure.   No outpatient medications have been marked as taking for the 11/10/18 encounter (Telemedicine) with Wynn BankerKoberlein, Junell C, MD.    Review of Systems  Constitutional: Negative for chills, fatigue and fever.  HENT: Negative for congestion, sinus pressure and sinus pain.   Respiratory: Negative for cough, chest tightness, shortness of breath and  wheezing.   Cardiovascular: Negative for chest pain, palpitations and leg swelling.  Skin: Negative for color change and rash.  Neurological: Positive for headaches (still getting occasional).  Psychiatric/Behavioral: Negative for sleep disturbance (sleeping "normal" - too many things to accomplish).    Objective:  Wt 243 lb (110.2 kg)   BMI 40.44 kg/m   Weight: 243 lb (110.2 kg)   BP Readings from Last 3 Encounters:  06/23/18 100/68  05/07/18 110/70  05/05/18 (!) 147/96   Wt Readings from Last 3 Encounters:  11/10/18 243 lb (110.2 kg)  06/23/18 259 lb 12.8 oz (117.8 kg)  05/07/18 263 lb 3.2 oz (119.4 kg)    EXAM:  GENERAL: alert, oriented, appears well and in no acute distress  HEENT: atraumatic, conjunctiva clear, no obvious abnormalities on inspection of external nose and ears  NECK: normal movements of the head and neck  LUNGS: on inspection no signs of respiratory distress, breathing rate appears normal, no obvious gross SOB, gasping or wheezing  CV: no obvious cyanosis  MS: moves all visible extremities without noticeable abnormality  PSYCH/NEURO: pleasant and cooperative, no obvious depression or anxiety, speech and thought processing grossly intact   Assessment/Plan  1. Morbid obesity (HCC) She is eating healthier, smaller portions. She has lost weight. Stressed importance of regular exercise. We discussed that phentermine is not ideal long term. I will refill 1 month and then have her return for in office visit/weight recheck. We can determine long term plan at that time. - phentermine 37.5 MG capsule; Take 1 capsule (37.5 mg  total) by mouth every morning. Needs office visit before further refills.  Dispense: 30 capsule; Refill: 0  2. Hair loss Newer issue. Will check some bloodwork. May be related to weight loss or even related to significant illness she had back a few months ago.  - TSH; Future - VITAMIN D 25 Hydroxy (Vit-D Deficiency, Fractures);  Future - Ferritin; Future     I discussed the assessment and treatment plan with the patient. The patient was provided an opportunity to ask questions and all were answered. The patient agreed with the plan and demonstrated an understanding of the instructions.   The patient was advised to call back or seek an in-person evaluation if the symptoms worsen or if the condition fails to improve as anticipated.  I provided 20 minutes of non-face-to-face time during this encounter.   Micheline Rough, MD

## 2018-11-11 NOTE — Progress Notes (Signed)
I left a detailed message at the pts cell number to call back for appts as below. 

## 2018-11-30 ENCOUNTER — Other Ambulatory Visit (INDEPENDENT_AMBULATORY_CARE_PROVIDER_SITE_OTHER): Payer: 59

## 2018-11-30 ENCOUNTER — Other Ambulatory Visit: Payer: Self-pay

## 2018-11-30 DIAGNOSIS — L659 Nonscarring hair loss, unspecified: Secondary | ICD-10-CM | POA: Diagnosis not present

## 2018-11-30 DIAGNOSIS — K921 Melena: Secondary | ICD-10-CM

## 2018-12-01 LAB — COMPREHENSIVE METABOLIC PANEL
ALT: 16 U/L (ref 0–35)
AST: 15 U/L (ref 0–37)
Albumin: 4.2 g/dL (ref 3.5–5.2)
Alkaline Phosphatase: 58 U/L (ref 39–117)
BUN: 14 mg/dL (ref 6–23)
CO2: 27 mEq/L (ref 19–32)
Calcium: 9 mg/dL (ref 8.4–10.5)
Chloride: 104 mEq/L (ref 96–112)
Creatinine, Ser: 0.71 mg/dL (ref 0.40–1.20)
GFR: 93.41 mL/min (ref >60.00)
Glucose, Bld: 88 mg/dL (ref 70–99)
Potassium: 4 mEq/L (ref 3.5–5.1)
Sodium: 138 mEq/L (ref 135–145)
Total Bilirubin: 0.2 mg/dL (ref 0.2–1.2)
Total Protein: 7 g/dL (ref 6.0–8.3)

## 2018-12-01 LAB — TSH: TSH: 1.78 u[IU]/mL (ref 0.35–4.50)

## 2018-12-01 LAB — CBC WITH DIFFERENTIAL/PLATELET
Basophils Absolute: 0.1 10*3/uL (ref 0.0–0.1)
Basophils Relative: 0.8 % (ref 0.0–3.0)
Eosinophils Absolute: 0.2 10*3/uL (ref 0.0–0.7)
Eosinophils Relative: 1.9 % (ref 0.0–5.0)
HCT: 35.7 % — ABNORMAL LOW (ref 36.0–46.0)
Hemoglobin: 11.9 g/dL — ABNORMAL LOW (ref 12.0–15.0)
Lymphocytes Relative: 36.4 % (ref 12.0–46.0)
Lymphs Abs: 3.5 10*3/uL (ref 0.7–4.0)
MCHC: 33.3 g/dL (ref 30.0–36.0)
MCV: 88.4 fl (ref 78.0–100.0)
Monocytes Absolute: 0.6 10*3/uL (ref 0.1–1.0)
Monocytes Relative: 6.4 % (ref 3.0–12.0)
Neutro Abs: 5.2 10*3/uL (ref 1.4–7.7)
Neutrophils Relative %: 54.5 % (ref 43.0–77.0)
Platelets: 264 10*3/uL (ref 150.0–400.0)
RBC: 4.03 Mil/uL (ref 3.87–5.11)
RDW: 13.3 % (ref 11.5–15.5)
WBC: 9.6 10*3/uL (ref 4.0–10.5)

## 2018-12-01 LAB — VITAMIN D 25 HYDROXY (VIT D DEFICIENCY, FRACTURES): VITD: 14.43 ng/mL — ABNORMAL LOW (ref 30.00–100.00)

## 2018-12-01 LAB — FERRITIN: Ferritin: 41.4 ng/mL (ref 10.0–291.0)

## 2018-12-06 MED ORDER — VITAMIN D (ERGOCALCIFEROL) 1.25 MG (50000 UNIT) PO CAPS: 50000.0000 [IU] | ORAL_CAPSULE | ORAL | 0 refills | Status: DC

## 2018-12-07 ENCOUNTER — Other Ambulatory Visit: Payer: Self-pay | Admitting: *Deleted

## 2018-12-07 ENCOUNTER — Telehealth: Payer: Self-pay | Admitting: *Deleted

## 2018-12-07 DIAGNOSIS — K921 Melena: Secondary | ICD-10-CM

## 2018-12-07 MED ORDER — VITAMIN D (ERGOCALCIFEROL) 1.25 MG (50000 UNIT) PO CAPS: 50000.0000 [IU] | ORAL_CAPSULE | ORAL | 0 refills | Status: DC

## 2018-12-07 NOTE — Addendum Note (Signed)
Addended by: Agnes Lawrence on: 12/07/2018 09:08 AM   Modules accepted: Orders

## 2018-12-07 NOTE — Telephone Encounter (Signed)
Pt called about missed call; the pt would like for the Vit D to be sent to [facility]; pt given info about fecal occult test left at front desk for her; the pt would also like to know if her husband or mother can pick up the test for her because she is working in [place] today ;per Isac Sarna is at lunch; the pt can be contacted at [phone], and a message can be left on her voicemail; also see CRM [phone].

## 2018-12-07 NOTE — Telephone Encounter (Signed)
I left a detailed message stating a family member can pick up the test kit and the Rx re-sent to [facility].

## 2018-12-07 NOTE — Addendum Note (Signed)
Addended by: Agnes Lawrence on: 12/07/2018 02:15 PM   Modules accepted: Orders

## 2018-12-10 ENCOUNTER — Encounter: Payer: Self-pay | Admitting: Family Medicine

## 2018-12-15 ENCOUNTER — Other Ambulatory Visit (INDEPENDENT_AMBULATORY_CARE_PROVIDER_SITE_OTHER): Payer: 59

## 2018-12-15 DIAGNOSIS — K921 Melena: Secondary | ICD-10-CM

## 2018-12-15 LAB — POC HEMOCCULT BLD/STL (HOME/3-CARD/SCREEN)
Card #2 Fecal Occult Blod, POC: NEGATIVE
Card #3 Fecal Occult Blood, POC: NEGATIVE
Fecal Occult Blood, POC: NEGATIVE

## 2018-12-25 IMAGING — DX DG FOOT COMPLETE 3+V*L*
3 series · 3 of 3 positions shown · non-contrast
Comparison: None.

CLINICAL DATA: Plantar left foot pain.  No known injury.

EXAM:
LEFT FOOT - COMPLETE 3+ VIEW

[foot ap]
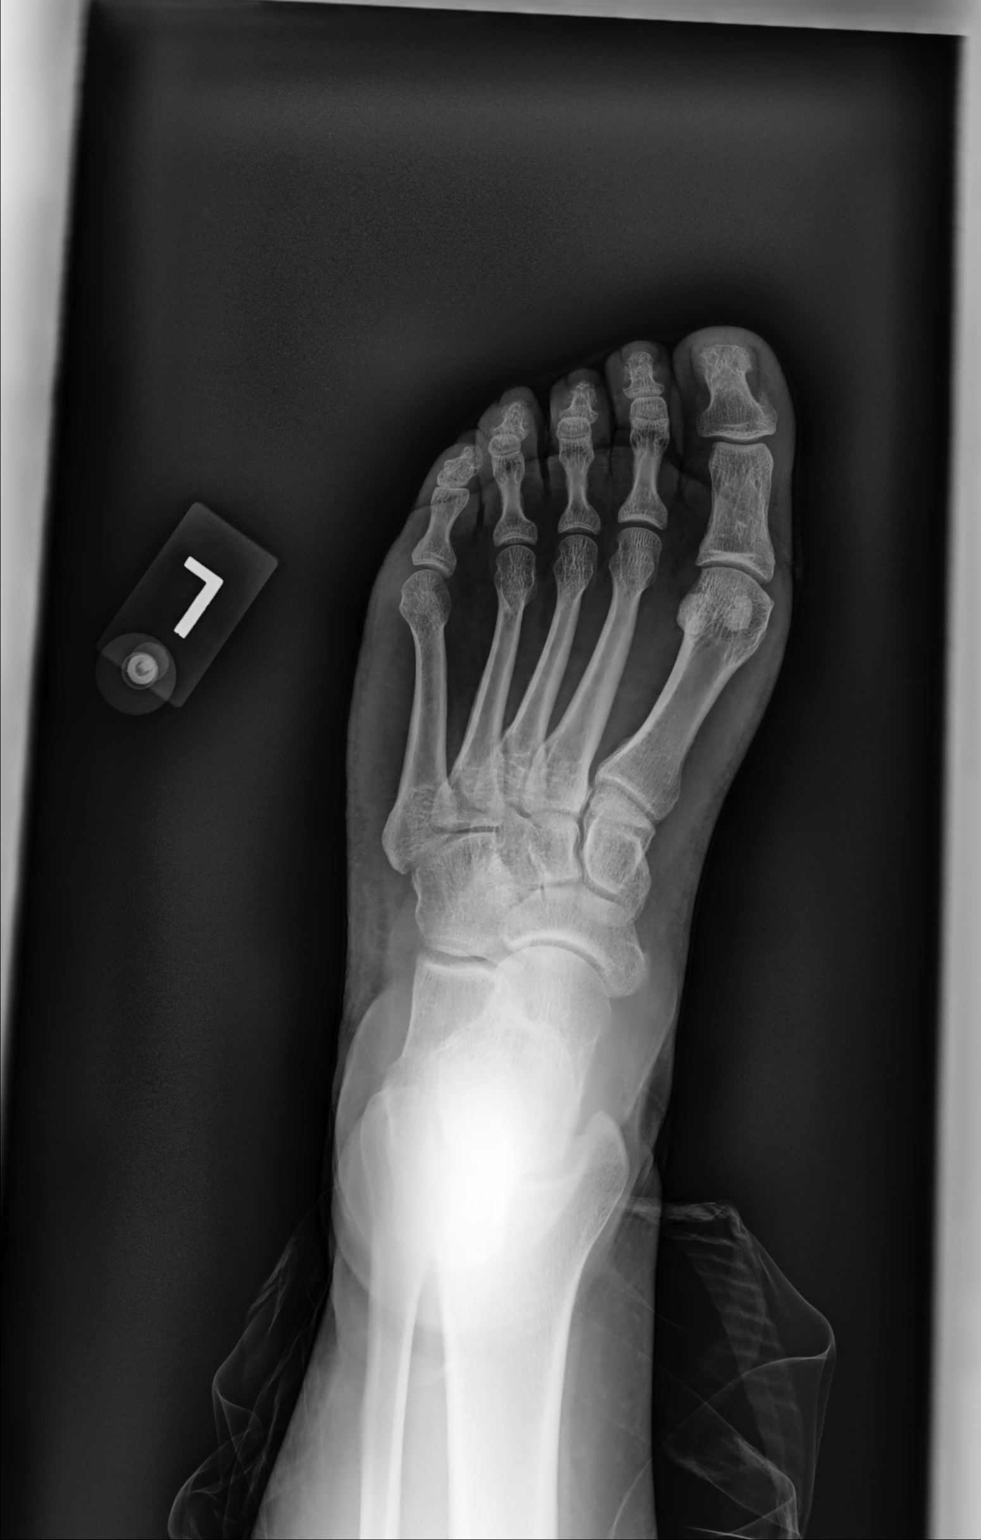

[foot mlo]
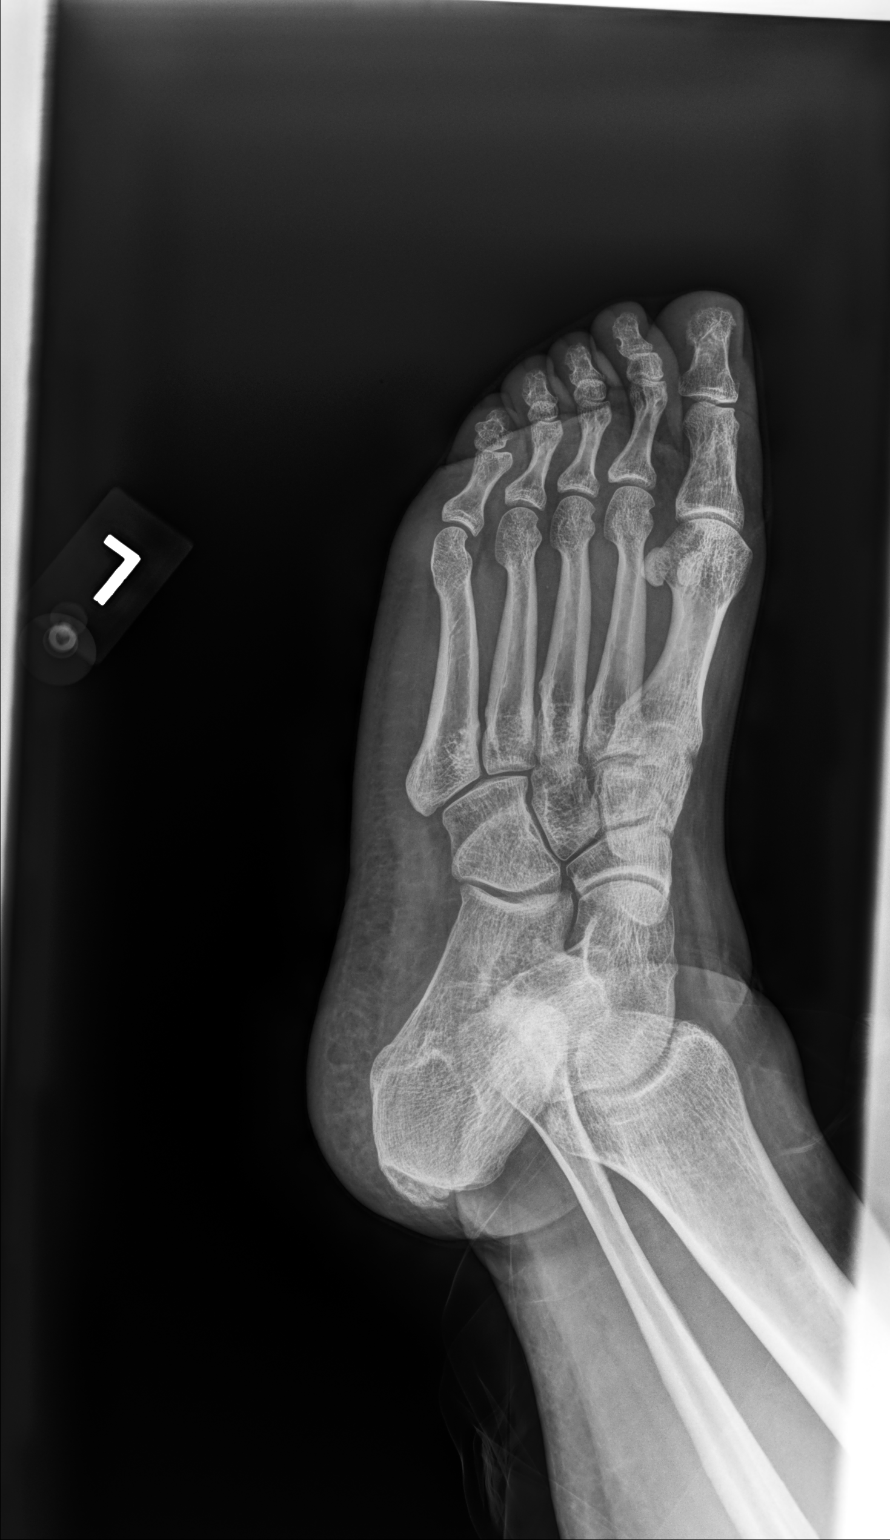

[foot lat]
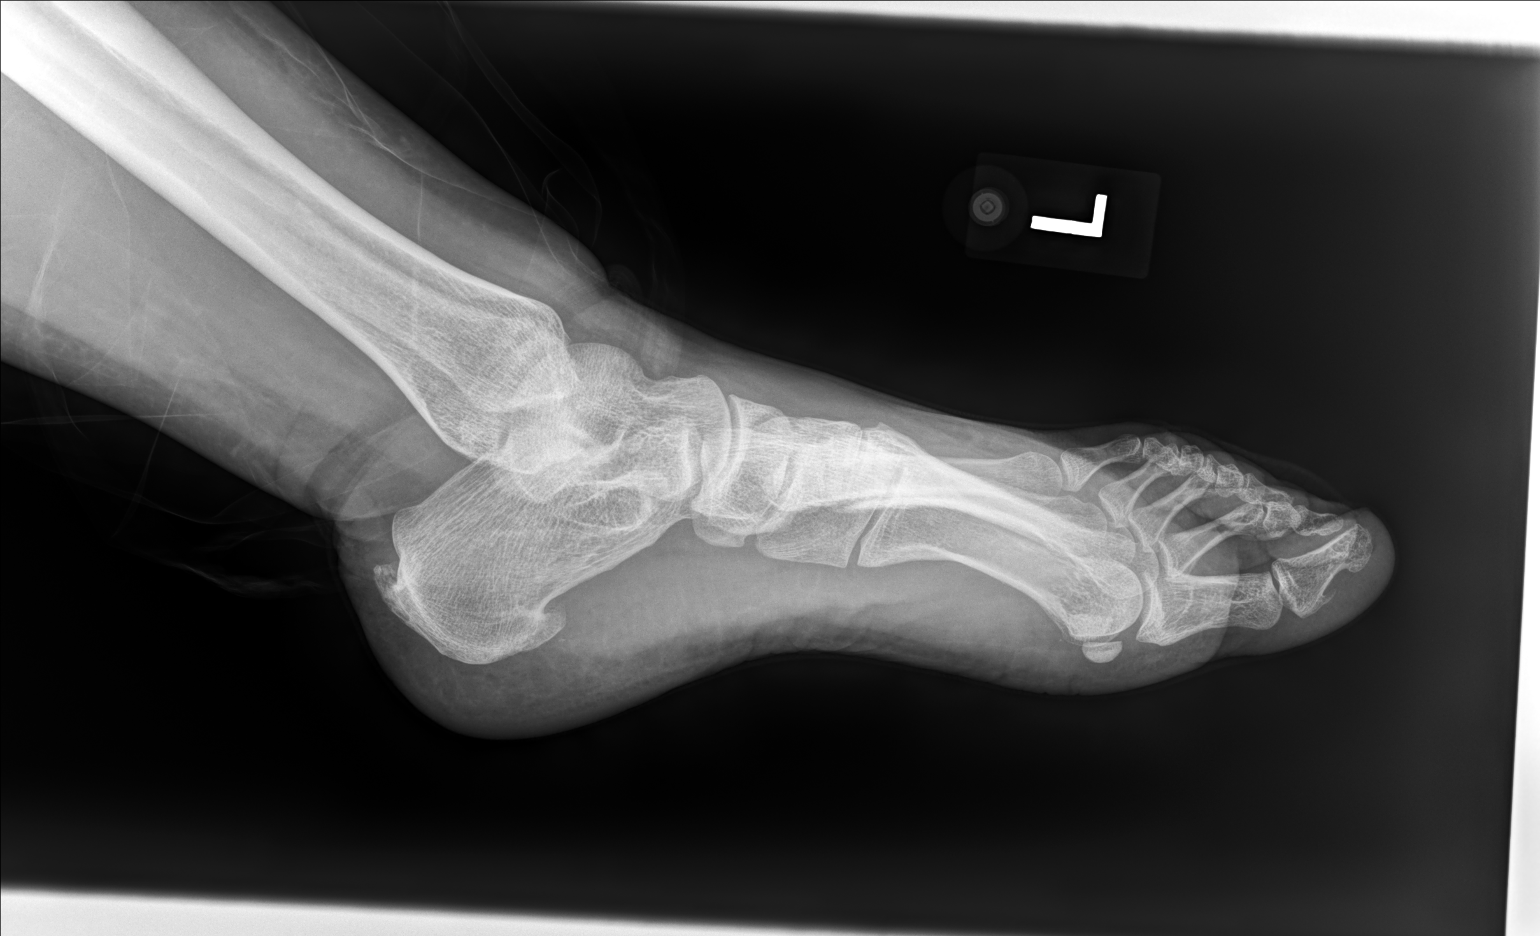

[3 of 3 positions shown; findings below may reference images not displayed]

FINDINGS: No acute bony or joint abnormality is seen. Joint spaces and
alignment are maintained. Mineralization and alignment are normal.
Small dorsal and plantar calcaneal spurs are noted.
IMPRESSION: No acute finding.

Calcaneal spurring.

## 2019-01-03 ENCOUNTER — Other Ambulatory Visit: Payer: Self-pay

## 2019-01-03 ENCOUNTER — Encounter: Payer: Self-pay | Admitting: Family Medicine

## 2019-01-03 ENCOUNTER — Ambulatory Visit: Payer: 59 | Admitting: Family Medicine

## 2019-01-03 VITALS — BP 100/70 | HR 80 | Temp 97.8°F | Ht 65.0 in | Wt 245.8 lb

## 2019-01-03 DIAGNOSIS — Z20828 Contact with and (suspected) exposure to other viral communicable diseases: Secondary | ICD-10-CM

## 2019-01-03 DIAGNOSIS — R51 Headache: Secondary | ICD-10-CM

## 2019-01-03 DIAGNOSIS — L659 Nonscarring hair loss, unspecified: Secondary | ICD-10-CM | POA: Diagnosis not present

## 2019-01-03 DIAGNOSIS — Z20822 Contact with and (suspected) exposure to covid-19: Secondary | ICD-10-CM

## 2019-01-03 DIAGNOSIS — R519 Headache, unspecified: Secondary | ICD-10-CM

## 2019-01-03 LAB — SARS-COV-2 IGG: SARS-COV-2 IgG: 0.02

## 2019-01-03 MED ORDER — TOPIRAMATE 25 MG PO TABS: 25.0000 mg | ORAL_TABLET | Freq: Two times a day (BID) | ORAL | 1 refills | Status: DC

## 2019-01-03 MED FILL — TOPIRAMATE 25 MG TAB: 25 | 90 days supply | Qty: 180 | Fill #0

## 2019-01-03 NOTE — Progress Notes (Signed)
Debra Parker DOB: 05-31-1985 Encounter date: 01/03/2019  This is a 33 y.o. female who presents with Chief Complaint  Patient presents with  . Follow-up    History of present illness: Doing ok.   Was taking the phentermine, but then read about side effects and worried it might be contributing to hair loss.   Feels like she is still losing hair. Noting it everywhere. Thinning all over. No issues with hair loss in family.   Iron supplements made her nauseous. Has been doing vitamin D weekly.  Has bicycle in house now. Hasn't been on there as much as she would like. Doing work and home schooling. Lucky if she gets on once a week. Trying to work on fitting in.   Still getting migraines about twice a week. Usually not bad enough that she has to stop what she is doing. Has been able to manage with tylenol alone.   Has done weight watchers in past and did have some success with this.   Allergies  Allergen Reactions  . Sulfa Antibiotics Other (See Comments)    Not sure.   Current Meds  Medication Sig  . acetaminophen (TYLENOL) 500 MG tablet Take 500 mg by mouth every 6 (six) hours as needed for mild pain.  Marland Kitchen albuterol (PROAIR HFA) 108 (90 Base) MCG/ACT inhaler Inhale 2 puffs into the lungs every 6 (six) hours as needed for wheezing or shortness of breath.  . montelukast (SINGULAIR) 10 MG tablet Take 1 tablet (10 mg total) by mouth at bedtime.  Marland Kitchen Spacer/Aero-Holding Chambers (E-Z SPACER) inhaler Use as instructed  . topiramate (TOPAMAX) 25 MG tablet Take 1 tablet (25 mg total) by mouth 2 (two) times daily.  . Vitamin D, Ergocalciferol, (DRISDOL) 1.25 MG (50000 UT) CAPS capsule Take 1 capsule (50,000 Units total) by mouth every 7 (seven) days.  . [DISCONTINUED] phentermine 37.5 MG capsule Take 1 capsule (37.5 mg total) by mouth every morning. Needs office visit before further refills.  . [DISCONTINUED] topiramate (TOPAMAX) 25 MG tablet Take 1 tablet (25 mg total) by mouth daily.     Review of Systems  Constitutional: Negative for chills, fatigue and fever.  Respiratory: Negative for cough, chest tightness, shortness of breath and wheezing.   Cardiovascular: Negative for chest pain, palpitations and leg swelling.  Skin:       Hair loss; coming out by roots. Notes all over head    Objective:  BP 100/70 (BP Location: Left Arm, Patient Position: Sitting, Cuff Size: Large)   Pulse 80   Temp 97.8 F (36.6 C) (Temporal)   Ht 5\' 5"  (1.651 m)   Wt 245 lb 12.8 oz (111.5 kg)   SpO2 97%   BMI 40.90 kg/m   Weight: 245 lb 12.8 oz (111.5 kg)   BP Readings from Last 3 Encounters:  01/03/19 100/70  06/23/18 100/68  05/07/18 110/70   Wt Readings from Last 3 Encounters:  01/03/19 245 lb 12.8 oz (111.5 kg)  11/10/18 243 lb (110.2 kg)  06/23/18 259 lb 12.8 oz (117.8 kg)    Physical Exam Constitutional:      General: She is not in acute distress.    Appearance: She is well-developed.  Cardiovascular:     Rate and Rhythm: Normal rate and regular rhythm.     Heart sounds: Normal heart sounds. No murmur. No friction rub.  Pulmonary:     Effort: Pulmonary effort is normal. No respiratory distress.     Breath sounds: Normal breath sounds. No wheezing  or rales.  Musculoskeletal:     Right lower leg: No edema.     Left lower leg: No edema.  Skin:    Comments: Follicles are more spaced on top of head. No significant hair breakage noted. There are a couple areas of dry scalp (dime size) posterior scalp, but no scarring, dry scalp on top of head.   Neurological:     Mental Status: She is alert and oriented to person, place, and time.  Psychiatric:        Behavior: Behavior normal.     Assessment/Plan 1. Morbid obesity (HCC) She is going to work on biking on more regular basis. Hold phentermine as she is worried it has contributed to hair loss. - topiramate (TOPAMAX) 25 MG tablet; Take 1 tablet (25 mg total) by mouth 2 (two) times daily.  Dispense: 180 tablet;  Refill: 1  2. Nonintractable headache, unspecified chronicity pattern, unspecified headache type Headaches have improved but still would like to have better control with less otc treatment use.  - topiramate (TOPAMAX) 25 MG tablet; Take 1 tablet (25 mg total) by mouth 2 (two) times daily.  Dispense: 180 tablet; Refill: 1  3. Hair loss We discussed bloodwork. I do not feel that there are bloodwork abnormalities significant enough to cause hair loss; will have derm see her.  - Ambulatory referral to Dermatology d  Return in about 6 months (around 07/03/2019) for physical exam.    Theodis Shove, MD

## 2019-01-18 DIAGNOSIS — L661 Lichen planopilaris: Secondary | ICD-10-CM | POA: Diagnosis not present

## 2019-01-18 MED FILL — DOXYCYCLINE HYC 50 MG CAP: 50 | 30 days supply | Qty: 30 | Fill #0

## 2019-01-18 MED FILL — BETAMETHASONE DP 0.05% LOT: 0.05 | 30 days supply | Qty: 120 | Fill #0

## 2019-01-19 ENCOUNTER — Encounter: Payer: Self-pay | Admitting: Family Medicine

## 2019-01-26 ENCOUNTER — Other Ambulatory Visit: Payer: Self-pay | Admitting: Family Medicine

## 2019-01-26 MED ORDER — VALACYCLOVIR HCL 1 G PO TABS: ORAL_TABLET | ORAL | 1 refills | Status: DC
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
## 2019-02-18 MED FILL — DOXYCYCLINE HYC 50 MG CAP: 50 | 30 days supply | Qty: 30 | Fill #1

## 2019-02-18 MED FILL — TOPIRAMATE 25 MG TAB: 25 | 90 days supply | Qty: 180 | Fill #0

## 2019-02-25 ENCOUNTER — Other Ambulatory Visit: Payer: Self-pay

## 2019-02-25 DIAGNOSIS — Z20822 Contact with and (suspected) exposure to covid-19: Secondary | ICD-10-CM

## 2019-02-25 NOTE — Addendum Note (Signed)
Addended by: Marvene Staff on: 02/25/2019 01:26 PM   Modules accepted: Level of Service, SmartSet

## 2019-02-28 LAB — NOVEL CORONAVIRUS, NAA: SARS-CoV-2, NAA: NOT DETECTED

## 2019-03-17 ENCOUNTER — Other Ambulatory Visit: Payer: Self-pay | Admitting: Family Medicine

## 2019-04-18 ENCOUNTER — Encounter: Payer: Self-pay | Admitting: Family Medicine

## 2019-04-18 ENCOUNTER — Other Ambulatory Visit: Payer: Self-pay | Admitting: Family Medicine

## 2019-04-18 MED ORDER — TOPIRAMATE 50 MG PO TABS: 50.0000 mg | ORAL_TABLET | Freq: Two times a day (BID) | ORAL | 0 refills | Status: DC

## 2019-04-18 MED FILL — DOXYCYCLINE HYC 50 MG CAP: 50 | 30 days supply | Qty: 30 | Fill #2

## 2019-04-18 MED FILL — BETAMETHASONE DP 0.05% LOT: 0.05 | 30 days supply | Qty: 120 | Fill #1

## 2019-04-18 MED FILL — TOPIRAMATE 50 MG TABLET: 50 | 90 days supply | Qty: 180 | Fill #0

## 2019-05-17 NOTE — Telephone Encounter (Signed)
Error

## 2019-06-11 ENCOUNTER — Ambulatory Visit: Payer: 59 | Attending: Internal Medicine

## 2019-06-11 DIAGNOSIS — Z23 Encounter for immunization: Secondary | ICD-10-CM | POA: Insufficient documentation

## 2019-06-11 NOTE — Progress Notes (Signed)
   Covid-19 Vaccination Clinic  Name:  Debra Parker    MRN: [id number] DOB: 08/08/1985  06/11/2019  Debra Parker was observed post Covid-19 immunization for 15 minutes without incident. She was provided with Vaccine Information Sheet and instruction to access the V-Safe system.   Debra Parker was instructed to call 911 with any severe reactions post vaccine: Marland Kitchen Difficulty breathing  . Swelling of face and throat  . A fast heartbeat  . A bad rash all over body  . Dizziness and weakness   Immunizations Administered    Name Date Dose VIS Date Route   Moderna COVID-19 Vaccine 06/11/2019 11:52 AM 0.5 mL 03/08/2019 Intramuscular   Manufacturer: Moderna   Lot: [id number]   NDC: 62952-841-32

## 2019-07-11 ENCOUNTER — Other Ambulatory Visit: Payer: Self-pay | Admitting: Family Medicine

## 2019-07-11 MED FILL — DOXYCYCLINE HYC 50 MG CAP: 50 | 30 days supply | Qty: 30 | Fill #3

## 2019-07-12 MED FILL — TOPIRAMATE 50 MG TABLET: 50 | 90 days supply | Qty: 180 | Fill #0

## 2019-07-13 ENCOUNTER — Ambulatory Visit: Payer: 59 | Attending: Internal Medicine

## 2019-07-13 DIAGNOSIS — Z23 Encounter for immunization: Secondary | ICD-10-CM

## 2019-07-13 NOTE — Progress Notes (Signed)
   Covid-19 Vaccination Clinic  Name:  Debra Parker    MRN: [id number] DOB: 04-10-1985  07/13/2019  [name] was observed post Covid-19 immunization for 15 minutes without incident. She was provided with Vaccine Information Sheet and instruction to access the V-Safe system.   [name] was instructed to call 911 with any severe reactions post vaccine: Marland Kitchen Difficulty breathing  . Swelling of face and throat  . A fast heartbeat  . A bad rash all over body  . Dizziness and weakness   Immunizations Administered    Name Date Dose VIS Date Route   Moderna COVID-19 Vaccine 07/13/2019  8:18 AM 0.5 mL 03/08/2019 Intramuscular   Manufacturer: Gala Murdoch   Lot: [id number]   NDC: 95844-171-27

## 2019-07-14 ENCOUNTER — Ambulatory Visit: Payer: Self-pay

## 2019-08-22 MED FILL — DOXYCYCLINE HYC 50 MG CAP: 50 | 30 days supply | Qty: 30 | Fill #4

## 2019-08-22 MED FILL — BETAMETHASONE DP 0.05% LOT: 0.05 | 30 days supply | Qty: 120 | Fill #2

## 2019-08-31 MED FILL — DOXYCYCLINE HYC 50 MG CAP: 50 | 30 days supply | Qty: 30 | Fill #4

## 2019-08-31 MED FILL — BETAMETHASONE DP 0.05% LOT: 0.05 | 30 days supply | Qty: 120 | Fill #2

## 2019-09-01 ENCOUNTER — Other Ambulatory Visit (HOSPITAL_COMMUNITY): Payer: Self-pay | Admitting: Dermatology

## 2019-09-01 DIAGNOSIS — L661 Lichen planopilaris: Secondary | ICD-10-CM | POA: Diagnosis not present

## 2019-11-10 ENCOUNTER — Other Ambulatory Visit: Payer: Self-pay | Admitting: Family Medicine

## 2019-11-14 ENCOUNTER — Encounter: Payer: Self-pay | Admitting: Family Medicine

## 2019-11-14 MED ORDER — PREDNISONE 20 MG PO TABS: 40.0000 mg | ORAL_TABLET | Freq: Every day | ORAL | 0 refills | Status: DC

## 2020-02-17 ENCOUNTER — Ambulatory Visit: Payer: 59 | Admitting: Family Medicine

## 2020-02-17 ENCOUNTER — Other Ambulatory Visit (HOSPITAL_COMMUNITY): Payer: Self-pay | Admitting: Family Medicine

## 2020-02-17 ENCOUNTER — Encounter: Payer: Self-pay | Admitting: Family Medicine

## 2020-02-17 ENCOUNTER — Other Ambulatory Visit: Payer: Self-pay

## 2020-02-17 VITALS — BP 104/78 | HR 77 | Temp 98.3°F | Ht 65.0 in | Wt 274.5 lb

## 2020-02-17 DIAGNOSIS — Z131 Encounter for screening for diabetes mellitus: Secondary | ICD-10-CM

## 2020-02-17 DIAGNOSIS — R635 Abnormal weight gain: Secondary | ICD-10-CM | POA: Diagnosis not present

## 2020-02-17 DIAGNOSIS — R0683 Snoring: Secondary | ICD-10-CM

## 2020-02-17 DIAGNOSIS — Z13220 Encounter for screening for lipoid disorders: Secondary | ICD-10-CM

## 2020-02-17 DIAGNOSIS — E559 Vitamin D deficiency, unspecified: Secondary | ICD-10-CM | POA: Diagnosis not present

## 2020-02-17 DIAGNOSIS — L661 Lichen planopilaris: Secondary | ICD-10-CM | POA: Diagnosis not present

## 2020-02-17 DIAGNOSIS — G43909 Migraine, unspecified, not intractable, without status migrainosus: Secondary | ICD-10-CM | POA: Insufficient documentation

## 2020-02-17 DIAGNOSIS — Z30431 Encounter for routine checking of intrauterine contraceptive device: Secondary | ICD-10-CM

## 2020-02-17 DIAGNOSIS — G43009 Migraine without aura, not intractable, without status migrainosus: Secondary | ICD-10-CM | POA: Diagnosis not present

## 2020-02-17 MED ORDER — AIMOVIG 70 MG/ML ~~LOC~~ SOAJ: 70.0000 mg | SUBCUTANEOUS | 5 refills | Status: DC

## 2020-02-17 MED ORDER — TOPIRAMATE 50 MG PO TABS: ORAL_TABLET | ORAL | 1 refills | Status: DC

## 2020-02-17 MED ORDER — PHENTERMINE HCL 37.5 MG PO CAPS: 37.5000 mg | ORAL_CAPSULE | ORAL | 0 refills | Status: DC

## 2020-02-17 NOTE — Progress Notes (Signed)
Debra Parker DOB: Mar 07, 1986 Encounter date: 02/17/2020  This is a 34 y.o. female who presents with Chief Complaint  Patient presents with  . Medication Refill    History of present illness: Last visit with patient was 12/26/2018.  Migraine: Topamax daily - taking 100mg  at night. Wondering about increasing dose because she is still getting migraines in morning. Still getting migraines 3-4 times/week. No bad side effects with topamax.   Having hard time with weight. Has had hard time w daughter being in hospital with COVID; stopped doing the optiva. Very expensive. Phentermine worked for a little while but then seemed to stop. Contrave caused headaches.   She is taking doxycycline daily and using ointment on hair at night.   Allergies  Allergen Reactions  . Sulfa Antibiotics Other (See Comments)    Not sure.   Current Meds  Medication Sig  . acetaminophen (TYLENOL) 500 MG tablet Take 500 mg by mouth every 6 (six) hours as needed for mild pain.  albuterol (PROAIR HFA) 108 (90 Base) MCG/ACT inhaler Inhale 2 puffs into the lungs every 6 (six) hours as needed for wheezing or shortness of breath.  . montelukast (SINGULAIR) 10 MG tablet Take 1 tablet (10 mg total) by mouth at bedtime. (Patient taking differently: Take 10 mg by mouth as needed. )  . Spacer/Aero-Holding Chambers (E-Z SPACER) inhaler Use as instructed  . topiramate (TOPAMAX) 50 MG tablet TAKE 1 TABLET (50 MG TOTAL) BY MOUTH 2 (TWO) TIMES DAILY. (Patient taking differently: Take 100 mg by mouth daily. )  . valACYclovir (VALTREX) 1000 MG tablet 2000mg  (1 tabs) PO BID x 1 day. Take at onset of outbreak.    Review of Systems  Constitutional: Positive for unexpected weight change (gaining). Negative for chills, fatigue and fever.  Respiratory: Negative for cough, chest tightness, shortness of breath and wheezing.   Cardiovascular: Negative for chest pain, palpitations and leg swelling.  Neurological: Positive for  headaches.    Objective:  BP 104/78 (BP Location: Left Arm, Patient Position: Sitting, Cuff Size: Large)   Pulse 77   Temp 98.3 F (36.8 C) (Oral)   Ht 5\' 5"  (1.651 m)   Wt 274 lb 8 oz (124.5 kg)   BMI 45.68 kg/m   Weight: 274 lb 8 oz (124.5 kg)   BP Readings from Last 3 Encounters:  02/17/20 104/78  01/03/19 100/70  06/23/18 100/68   Wt Readings from Last 3 Encounters:  02/17/20 274 lb 8 oz (124.5 kg)  01/03/19 245 lb 12.8 oz (111.5 kg)  11/10/18 243 lb (110.2 kg)    Physical Exam Constitutional:      General: She is not in acute distress.    Appearance: She is well-developed.  Cardiovascular:     Rate and Rhythm: Normal rate and regular rhythm.     Heart sounds: Normal heart sounds. No murmur heard.  No friction rub.  Pulmonary:     Effort: Pulmonary effort is normal. No respiratory distress.     Breath sounds: Normal breath sounds. No wheezing or rales.  Musculoskeletal:     Right lower leg: No edema.     Left lower leg: No edema.  Neurological:     Mental Status: She is alert and oriented to person, place, and time.  Psychiatric:        Behavior: Behavior normal.     Assessment/Plan  1. Migraine without aura and without status migrainosus, not intractable We are going to increase Topamax to 50 mg in the  morning and 100 mg at night.  We did also discuss preventative medications like Aimovig.  Previously, insurance would not cover these although they said it was due to reason of not allowing patient to be on 2 medications within the same category for migraine, but she was not?  We will try to write this again due to frequency of migraines in hopes that we can get some better prevention.  In the meanwhile, we will work on increasing Topamax dose as tolerated to see if this helps with prevention. - CBC with Differential/Platelet; Future - Comprehensive metabolic panel; Future - Ambulatory referral to Sleep Studies - CBC with Differential/Platelet - Comprehensive  metabolic panel  2. Weight gain We are to start with blood work, and then add back in phentermine.  She did have success with this before, but she was also sick and taking it intermittently.  We will bring her back in about 1 month for weight recheck.  Discussed importance of regular exercise and healthy eating.  Discussed meal planning. - TSH; Future - TSH - CBC with Differential/Platelet; Future - CMP; Future - TSH; Future  3. Encounter for management of intrauterine contraceptive device (IUD), unspecified IUD management type It is time for her to have her IUD changed.  She has only been using the IUD for period control.  Husband has a vasectomy, she has she does not need this for birth control purposes. - Ambulatory referral to Gynecology  4. Lipid screening - Lipid panel; Future - Lipid panel - CMP; Future - Lipid panel; Future  5. Screening for diabetes mellitus - Hemoglobin A1c; Future - Hemoglobin A1c - CBC with Differential/Platelet; Future - Hemoglobin A1c; Future  6. Vitamin D deficiency - VITAMIN D 25 Hydroxy (Vit-D Deficiency, Fractures); Future - VITAMIN D 25 Hydroxy (Vit-D Deficiency, Fractures) - Vitamin D, 25-hydroxy; Future  7. Snoring Patient stating that husband is noting that she is snoring.  She does not seem to have excessive fatigue, but she does have headaches very frequently and weeks of these.  I think further evaluation with sleep study is warranted due to this. - Ambulatory referral to Sleep Studies  8. Lichen planopilaris She is following regularly with dermatology and has noted some improvement in hair growth.     Return in about 1 month (around 03/18/2020) for Chronic condition visit.    Theodis Shove, MD

## 2020-02-19 ENCOUNTER — Telehealth: Payer: 59 | Admitting: Family

## 2020-02-19 ENCOUNTER — Other Ambulatory Visit: Payer: Self-pay | Admitting: Family

## 2020-02-19 ENCOUNTER — Encounter: Payer: Self-pay | Admitting: Family Medicine

## 2020-02-19 DIAGNOSIS — L661 Lichen planopilaris, unspecified: Secondary | ICD-10-CM | POA: Insufficient documentation

## 2020-02-19 DIAGNOSIS — R21 Rash and other nonspecific skin eruption: Secondary | ICD-10-CM | POA: Diagnosis not present

## 2020-02-19 DIAGNOSIS — M79652 Pain in left thigh: Secondary | ICD-10-CM

## 2020-02-19 MED ORDER — NYSTATIN-TRIAMCINOLONE 100000-0.1 UNIT/GM-% EX OINT: 1.0000 "application " | TOPICAL_OINTMENT | Freq: Two times a day (BID) | CUTANEOUS | 0 refills | Status: DC

## 2020-02-19 NOTE — Progress Notes (Signed)
E Visit for Rash  We are sorry that you are not feeling well. Here is how we plan to help!   Based upon your presentation it appears you have a fungal infection.  I have prescribed: and Nystatin cream apply to the affected area twice daily.    HOME CARE:   Take cool showers and avoid direct sunlight.  Apply cool compress or wet dressings.  Take a bath in an oatmeal bath.  Sprinkle content of one Aveeno packet under running faucet with comfortably warm water.  Bathe for 15-20 minutes, 1-2 times daily.  Pat dry with a towel. Do not rub the rash.  Use hydrocortisone cream.  Take an antihistamine like Benadryl for widespread rashes that itch.  The adult dose of Benadryl is 25-50 mg by mouth 4 times daily.  Caution:  This type of medication may cause sleepiness.  Do not drink alcohol, drive, or operate dangerous machinery while taking antihistamines.  Do not take these medications if you have prostate enlargement.  Read package instructions thoroughly on all medications that you take.  GET HELP RIGHT AWAY IF:   Symptoms don't go away after treatment.  Severe itching that persists.  If you rash spreads or swells.  If you rash begins to smell.  If it blisters and opens or develops a yellow-brown crust.  You develop a fever.  You have a sore throat.  You become short of breath.  MAKE SURE YOU:  Understand these instructions. Will watch your condition. Will get help right away if you are not doing well or get worse.  Thank you for choosing an e-visit. Your e-visit answers were reviewed by a board certified advanced clinical practitioner to complete your personal care plan. Depending upon the condition, your plan could have included both over the counter or prescription medications. Please review your pharmacy choice. Be sure that the pharmacy you have chosen is open so that you can pick up your prescription now.  If there is a problem you may message your provider in MyChart  to have the prescription routed to another pharmacy. Your safety is important to [place]. If you have drug allergies check your prescription carefully.  For the next 24 hours, you can use MyChart to ask questions about today's visit, request a non-urgent call back, or ask for a work or school excuse from your e-visit provider. You will get an email in the next two days asking about your experience. I hope that your e-visit has been valuable and will speed your recovery.   Approximately 5 minutes was spent documenting and reviewing patient's chart.

## 2020-02-20 ENCOUNTER — Other Ambulatory Visit (INDEPENDENT_AMBULATORY_CARE_PROVIDER_SITE_OTHER): Payer: 59

## 2020-02-20 ENCOUNTER — Other Ambulatory Visit: Payer: Self-pay

## 2020-02-20 DIAGNOSIS — Z131 Encounter for screening for diabetes mellitus: Secondary | ICD-10-CM | POA: Diagnosis not present

## 2020-02-20 DIAGNOSIS — R635 Abnormal weight gain: Secondary | ICD-10-CM | POA: Diagnosis not present

## 2020-02-20 DIAGNOSIS — Z13220 Encounter for screening for lipoid disorders: Secondary | ICD-10-CM

## 2020-02-20 DIAGNOSIS — E559 Vitamin D deficiency, unspecified: Secondary | ICD-10-CM

## 2020-02-20 NOTE — Addendum Note (Signed)
Addended by: Lerry Liner on: 02/20/2020 07:25 AM   Modules accepted: Orders

## 2020-02-21 LAB — HEMOGLOBIN A1C
Hgb A1c MFr Bld: 5.9 % of total Hgb — ABNORMAL HIGH (ref <5.7)
Mean Plasma Glucose: 123 (calc)
eAG (mmol/L): 6.8 (calc)

## 2020-02-21 LAB — COMPREHENSIVE METABOLIC PANEL
AG Ratio: 1.3 (calc) (ref 1.0–2.5)
ALT: 15 U/L (ref 6–29)
AST: 16 U/L (ref 10–30)
Albumin: 4 g/dL (ref 3.6–5.1)
Alkaline phosphatase (APISO): 66 U/L (ref 31–125)
BUN: 12 mg/dL (ref 7–25)
CO2: 21 mmol/L (ref 20–32)
Calcium: 8.7 mg/dL (ref 8.6–10.2)
Chloride: 105 mmol/L (ref 98–110)
Creat: 0.64 mg/dL (ref 0.50–1.10)
Globulin: 3.2 g/dL (calc) (ref 1.9–3.7)
Glucose, Bld: 86 mg/dL (ref 65–99)
Potassium: 3.9 mmol/L (ref 3.5–5.3)
Sodium: 136 mmol/L (ref 135–146)
Total Bilirubin: 0.3 mg/dL (ref 0.2–1.2)
Total Protein: 7.2 g/dL (ref 6.1–8.1)

## 2020-02-21 LAB — CBC WITH DIFFERENTIAL/PLATELET
Absolute Monocytes: 752 cells/uL (ref 200–950)
Basophils Absolute: 52 cells/uL (ref 0–200)
Basophils Relative: 0.5 %
Eosinophils Absolute: 185 cells/uL (ref 15–500)
Eosinophils Relative: 1.8 %
HCT: 36.7 % (ref 35.0–45.0)
Hemoglobin: 12.3 g/dL (ref 11.7–15.5)
Lymphs Abs: 3646 cells/uL (ref 850–3900)
MCH: 29.2 pg (ref 27.0–33.0)
MCHC: 33.5 g/dL (ref 32.0–36.0)
MCV: 87.2 fL (ref 80.0–100.0)
MPV: 11.2 fL (ref 7.5–12.5)
Monocytes Relative: 7.3 %
Neutro Abs: 5665 cells/uL (ref 1500–7800)
Neutrophils Relative %: 55 %
Platelets: 278 10*3/uL (ref 140–400)
RBC: 4.21 10*6/uL (ref 3.80–5.10)
RDW: 12 % (ref 11.0–15.0)
Total Lymphocyte: 35.4 %
WBC: 10.3 10*3/uL (ref 3.8–10.8)

## 2020-02-21 LAB — LIPID PANEL
Cholesterol: 146 mg/dL (ref <200)
HDL: 40 mg/dL — ABNORMAL LOW (ref >=50)
LDL Cholesterol (Calc): 82 mg/dL (calc)
Non-HDL Cholesterol (Calc): 106 mg/dL (calc) (ref <130)
Total CHOL/HDL Ratio: 3.7 (calc) (ref <5.0)
Triglycerides: 141 mg/dL (ref <150)

## 2020-02-21 LAB — TSH: TSH: 2.37 mIU/L

## 2020-02-21 LAB — VITAMIN D 25 HYDROXY (VIT D DEFICIENCY, FRACTURES): Vit D, 25-Hydroxy: 13 ng/mL — ABNORMAL LOW (ref 30–100)

## 2020-02-22 ENCOUNTER — Other Ambulatory Visit: Payer: Self-pay | Admitting: Family Medicine

## 2020-02-22 MED ORDER — DOXYCYCLINE HYCLATE 100 MG PO TABS: 100.0000 mg | ORAL_TABLET | Freq: Two times a day (BID) | ORAL | 0 refills | Status: DC

## 2020-02-22 MED ORDER — VITAMIN D (ERGOCALCIFEROL) 1.25 MG (50000 UNIT) PO CAPS: 50000.0000 [IU] | ORAL_CAPSULE | ORAL | 1 refills | Status: DC

## 2020-02-22 NOTE — Addendum Note (Signed)
Addended by: Johnella Moloney on: 02/22/2020 01:29 PM   Modules accepted: Orders

## 2020-02-24 NOTE — Addendum Note (Signed)
Addended by: Wynn Banker on: 02/24/2020 05:17 PM   Modules accepted: Orders

## 2020-02-28 ENCOUNTER — Other Ambulatory Visit: Payer: Self-pay | Admitting: Family Medicine

## 2020-02-28 ENCOUNTER — Encounter: Payer: Self-pay | Admitting: Family Medicine

## 2020-02-28 NOTE — Addendum Note (Signed)
Addended by: Wynn Banker on: 02/28/2020 12:27 PM   Modules accepted: Orders

## 2020-02-29 ENCOUNTER — Other Ambulatory Visit: Payer: Self-pay

## 2020-02-29 ENCOUNTER — Ambulatory Visit (HOSPITAL_COMMUNITY)
Admission: RE | Admit: 2020-02-29 | Discharge: 2020-02-29 | Disposition: A | Discharge status: Home / Self Care | Payer: 59 | Source: Ambulatory Visit | Attending: Family Medicine | Admitting: Family Medicine

## 2020-02-29 ENCOUNTER — Encounter: Payer: Self-pay | Admitting: Family Medicine

## 2020-02-29 DIAGNOSIS — M79652 Pain in left thigh: Secondary | ICD-10-CM | POA: Diagnosis not present

## 2020-03-05 ENCOUNTER — Ambulatory Visit (HOSPITAL_COMMUNITY): Payer: 59

## 2020-03-06 DIAGNOSIS — L661 Lichen planopilaris: Secondary | ICD-10-CM | POA: Diagnosis not present

## 2020-03-21 ENCOUNTER — Ambulatory Visit: Payer: 59 | Admitting: Neurology

## 2020-03-21 ENCOUNTER — Encounter: Payer: Self-pay | Admitting: Neurology

## 2020-03-21 VITALS — BP 127/81 | HR 64 | Ht 66.0 in | Wt 264.0 lb

## 2020-03-21 DIAGNOSIS — R519 Headache, unspecified: Secondary | ICD-10-CM

## 2020-03-21 DIAGNOSIS — R0683 Snoring: Secondary | ICD-10-CM

## 2020-03-21 NOTE — Patient Instructions (Signed)

## 2020-03-21 NOTE — Progress Notes (Signed)
Subjective:    Patient ID: Debra Parker is a 34 y.o. female.  HPI     Debra Foley, MD, PhD [facility] [address] [address] [place], [place] [address]  Dear Dr. Hassan Rowan,   I saw your patient, Debra Parker, upon your kind request in my sleep clinic today for initial consultation of her sleep disorder, in particular, concern for underlying obstructive sleep apnea.  The patient is unaccompanied today.  As you know, Debra Parker is a 34 year old right-handed woman with an underlying medical history of hypertension, diverticulitis, bronchitis, migraine headaches, and severe obesity with a BMI of over 40, who reports snoring and worsening migraine headaches, occasional daytime tiredness and lack of energy, also morning headaches.  I reviewed your office note from 02/17/2020.  She has been on Topamax for her migraines and recently Aimovig was prescribed.  She has been on phentermine for weight loss. Her Epworth sleepiness score is 3 out of 24, fatigue severity score is 23 out of 63.  She works as an Public house manager at [employer].  She lives with her husband, she has 1 biological child and 3 stepchildren.  She has 1 cat in the household.  She does not watch TV in the bedroom but they do have a TV on sometimes in the bedroom.  She has never had a sleep study.  She would be willing to pursue a sleep study.  She goes to bed between 10 and 10:30 PM and rise time is around 5 AM.  She does not have night to night nocturia.  She recently tried the Aimovig but had a injection site reaction and did not have a second shot after that.  He does not drink caffeine, she is a non-smoker and does not utilize alcohol.  Her Past Medical History Is Significant For: Past Medical History:  Diagnosis Date  . Bronchitis   . Diverticulitis 2019  . Hypertension    when pregnant  . Migraines     Her Past Surgical History Is Significant For: Past Surgical History:  Procedure  Laterality Date  . CESAREAN SECTION  2007  . fatty pocket removed  1999  . WISDOM TOOTH EXTRACTION  2000    Her Family History Is Significant For: Family History  Problem Relation Age of Onset  . Diabetes Father   . Hyperlipidemia Father   . Early death Sister        stillborn  . Heart disease Brother   . Heart attack Brother 40  . Arthritis Maternal Grandmother   . Diabetes Maternal Grandmother   . Heart attack Maternal Grandmother   . Throat cancer Maternal Grandfather   . Diabetes Paternal Grandfather   . Stroke Paternal Grandfather     Her Social History Is Significant For: Social History   Socioeconomic History  . Marital status: Married    Spouse name: Not on file  . Number of children: Not on file  . Years of education: Not on file  . Highest education level: Not on file  Occupational History  . Not on file  Tobacco Use  . Smoking status: Never Smoker  . Smokeless tobacco: Never Used  Substance and Sexual Activity  . Alcohol use: No  . Drug use: No  . Sexual activity: Yes    Birth control/protection: I.U.D.  Other Topics Concern  . Not on file  Social History Narrative  . Not on file   Social Determinants of Health   Financial Resource Strain:  Not on file  Food Insecurity: Not on file  Transportation Needs: Not on file  Physical Activity: Not on file  Stress: Not on file  Social Connections: Not on file    Her Allergies Are:  Allergies  Allergen Reactions  . Aimovig [Erenumab-Aooe] Hives  . Sulfa Antibiotics Other (See Comments)    Not sure.  :   Her Current Medications Are:  Outpatient Encounter Medications as of 03/21/2020  Medication Sig  . acetaminophen (TYLENOL) 500 MG tablet Take 500 mg by mouth every 6 (six) hours as needed for mild pain.  Marland Kitchen albuterol (PROAIR HFA) 108 (90 Base) MCG/ACT inhaler Inhale 2 puffs into the lungs every 6 (six) hours as needed for wheezing or shortness of breath.  . montelukast (SINGULAIR) 10 MG tablet Take  1 tablet (10 mg total) by mouth at bedtime. (Patient taking differently: Take 10 mg by mouth as needed.)  . phentermine 37.5 MG capsule Take 1 capsule (37.5 mg total) by mouth every morning.  Marland Kitchen Spacer/Aero-Holding Chambers (E-Z SPACER) inhaler Use as instructed  . topiramate (TOPAMAX) 50 MG tablet Take 1 tablet in morning and 2 tablets at bedtime  . valACYclovir (VALTREX) 1000 MG tablet 2000mg  (1 tabs) PO BID x 1 day. Take at onset of outbreak.  . Vitamin D, Ergocalciferol, (DRISDOL) 1.25 MG (50000 UNIT) CAPS capsule Take 1 capsule (50,000 Units total) by mouth every 7 (seven) days.  levonorgestrel (MIRENA, 52 MG,) 20 MCG/24HR IUD 1 Intra Uterine Device (1 each total) by Intrauterine route once for 1 dose.  . [DISCONTINUED] nystatin-triamcinolone ointment (MYCOLOG) Apply 1 application topically 2 (two) times daily.   No facility-administered encounter medications on file as of 03/21/2020.  :  Review of Systems:  Out of a complete 14 point review of systems, all are reviewed and negative with the exception of these symptoms as listed below: Review of Systems  Neurological:       Pt presents today to discuss her sleep. Pt has never had a sleep study but does endorse snoring.  Epworth Sleepiness Scale 0= would never doze 1= slight chance of dozing 2= moderate chance of dozing 3= high chance of dozing  Sitting and reading: 0 Watching TV: 2 Sitting inactive in a public place (ex. Theater or meeting): 0 As a passenger in a car for an hour without a break: 0 Lying down to rest in the afternoon: 1 Sitting and talking to someone: 0 Sitting quietly after lunch (no alcohol): 0 In a car, while stopped in traffic: 0 Total: 3     Objective:  Neurological Exam  Physical Exam Physical Examination:   Vitals:   03/21/20 1510  BP: 127/81  Pulse: 64    General Examination: The patient is a very pleasant 34 y.o. female in no acute distress. She appears well-developed and well-nourished  and well groomed.   HEENT: Normocephalic, atraumatic, pupils are equal, round and reactive to light, extraocular tracking is good without limitation to gaze excursion or nystagmus noted. Hearing is grossly intact. Face is symmetric with normal facial animation. Speech is clear with no dysarthria noted. There is no hypophonia. There is no lip, neck/head, jaw or voice tremor. Neck is supple with full range of passive and active motion. There are no carotid bruits on auscultation. Oropharynx exam reveals: mild mouth dryness, good dental hygiene and moderate airway crowding, due to slightly smaller airway entry, tonsillar size of 2+ on the right and 1-2+ on the left.  Neck circumference is 16 7/8 inches.  Tongue protrudes centrally and palate elevates symmetrically.  Mallampati is class II, she does not have much in the way of overbite.  Chest: Clear to auscultation without wheezing, rhonchi or crackles noted.  Heart: S1+S2+0, regular and normal without murmurs, rubs or gallops noted.   Abdomen: Soft, non-tender and non-distended with normal bowel sounds appreciated on auscultation.  Extremities: There is no pitting edema in the distal lower extremities bilaterally.   Skin: Warm and dry without trophic changes noted.   Musculoskeletal: exam reveals no obvious joint deformities, tenderness or joint swelling or erythema.   Neurologically:  Mental status: The patient is awake, alert and oriented in all 4 spheres. Her immediate and remote memory, attention, language skills and fund of knowledge are appropriate. There is no evidence of aphasia, agnosia, apraxia or anomia. Speech is clear with normal prosody and enunciation. Thought process is linear. Mood is normal and affect is normal.  Cranial nerves II - XII are as described above under HEENT exam.  Motor exam: Normal bulk, strength and tone is noted. There is no tremor, Romberg is negative. Fine motor skills and coordination: grossly intact.   Cerebellar testing: No dysmetria or intention tremor. There is no truncal or gait ataxia.  Sensory exam: intact to light touch in the upper and lower extremities.  Gait, station and balance: She stands easily. No veering to one side is noted. No leaning to one side is noted. Posture is age-appropriate and stance is narrow based. Gait shows normal stride length and normal pace. No problems turning are noted. Tandem walk is unremarkable.                Assessment and Plan:  In summary, Debra Parker is a very pleasant 34 y.o.-year old female with an underlying medical history of hypertension, diverticulitis, bronchitis, migraine headaches, and severe obesity with a BMI of over 40, whose history and physical exam are concerning for obstructive sleep apnea (OSA). I had a long chat with the patient about my findings and the diagnosis of OSA, its prognosis and treatment options. We talked about medical treatments, surgical interventions and non-pharmacological approaches. I explained in particular the risks and ramifications of untreated moderate to severe OSA, especially with respect to developing cardiovascular disease down the Road, including congestive heart failure, difficult to treat hypertension, cardiac arrhythmias, or stroke. Even type 2 diabetes has, in part, been linked to untreated OSA. Symptoms of untreated OSA include daytime sleepiness, memory problems, mood irritability and mood disorder such as depression and anxiety, lack of energy, as well as recurrent headaches, especially morning headaches. We talked about trying to maintain a healthy lifestyle in general, as well as the importance of weight control. We also talked about the importance of good sleep hygiene. I recommended the following at this time: sleep study.  I explained the sleep test procedure to the patient and also outlined possible surgical and non-surgical treatment options of OSA, including the use of a custom-made dental  device (which would require a referral to a specialist dentist or oral surgeon), upper airway surgical options, such as traditional UPPP or a novel less invasive surgical option in the form of Inspire hypoglossal nerve stimulation (which would involve a referral to an ENT surgeon). I also explained the CPAP treatment option to the patient, who indicated that she would be willing to try CPAP if the need arises. I explained the importance of being compliant with PAP treatment, not only for insurance purposes but primarily to improve Her symptoms,  and for the patient's long term health benefit, including to reduce Her cardiovascular risks. I answered all her questions today and the patient was in agreement. I plan to see her back after the sleep study is completed and encouraged her to call with any interim questions, concerns, problems or updates.   Thank you very much for allowing me to participate in the care of this nice patient. If I can be of any further assistance to you please do not hesitate to call me at [phone].  Sincerely,   Star Age, MD, PhD

## 2020-03-26 ENCOUNTER — Telehealth: Payer: Self-pay

## 2020-03-26 NOTE — Telephone Encounter (Signed)
LVM for pt to call me back to schedule sleep study  

## 2020-03-28 ENCOUNTER — Other Ambulatory Visit: Payer: Self-pay | Admitting: Family Medicine

## 2020-03-29 ENCOUNTER — Encounter: Payer: Self-pay | Admitting: Family Medicine

## 2020-04-02 ENCOUNTER — Other Ambulatory Visit: Payer: Self-pay | Admitting: Family Medicine

## 2020-04-02 ENCOUNTER — Telehealth: Payer: Self-pay

## 2020-04-02 MED ORDER — SUMATRIPTAN SUCCINATE 100 MG PO TABS: ORAL_TABLET | ORAL | 2 refills | Status: DC

## 2020-04-02 NOTE — Telephone Encounter (Signed)
LVM for pt to call me back to schedule sleep study  

## 2020-04-13 ENCOUNTER — Encounter: Payer: Self-pay | Admitting: Family Medicine

## 2020-04-13 MED ORDER — ONDANSETRON HCL 4 MG PO TABS: 4.0000 mg | ORAL_TABLET | Freq: Three times a day (TID) | ORAL | 0 refills | Status: DC | PRN

## 2020-04-13 MED ORDER — ALBUTEROL SULFATE HFA 108 (90 BASE) MCG/ACT IN AERS: 2.0000 | INHALATION_SPRAY | Freq: Four times a day (QID) | RESPIRATORY_TRACT | 1 refills | Status: DC | PRN

## 2020-04-14 ENCOUNTER — Telehealth (HOSPITAL_COMMUNITY): Payer: Self-pay | Admitting: Pharmacist

## 2020-04-14 ENCOUNTER — Other Ambulatory Visit: Payer: Self-pay | Admitting: Physician Assistant

## 2020-04-14 DIAGNOSIS — U071 COVID-19: Secondary | ICD-10-CM

## 2020-04-14 MED ORDER — NIRMATRELVIR/RITONAVIR (PAXLOVID)TABLET: 3.0000 | ORAL_TABLET | Freq: Two times a day (BID) | ORAL | 0 refills | Status: AC

## 2020-04-14 MED FILL — PAXLOVID 20 X 150 MG & 10 X: 20 X 150 MG | 5 days supply | Qty: 30 | Fill #0

## 2020-04-14 NOTE — Progress Notes (Signed)
Outpatient Oral COVID Treatment Note  I connected with Debra Parker on 04/14/2020/9:41 AM by telephone and verified that I am speaking with the correct person using two identifiers.  I discussed the limitations, risks, security, and privacy concerns of performing an evaluation and management service by telephone and the availability of in person appointments. I also discussed with the patient that there may be a patient responsible charge related to this service. The patient expressed understanding and agreed to proceed.  Patient location: home Provider location: home  Diagnosis: COVID-19 infection  Purpose of visit: Discussion of potential use of Molnupiravir or Paxlovid, a new treatment for mild to moderate COVID-19 viral infection in non-hospitalized patients.   Subjective: Patient is a 35 y.o. female who has been diagnosed with COVID 19 viral infection.  Their symptoms began on 04/11/20 with fever, chills, congestion, diarrhea, nausea, and headache. She tested positive on 04/13/20.  She has chronic asthma, chronic bronchitis, and obesity. She is vaccinated (early 2021) but has not received a booster. Her O2 sat has been as low as 90% briefly, but has generally been in the mid-90s. She is not currently TTC and has been advised to use a barrier method.   Past Medical History:  Diagnosis Date  . Bronchitis   . Diverticulitis 2019  . Hypertension    when pregnant  . Migraines     Allergies  Allergen Reactions  . Aimovig [Erenumab-Aooe] Hives  . Sulfa Antibiotics Other (See Comments)    Not sure.    (Not in a hospital admission)   Current Outpatient Medications  Medication Sig Dispense Refill Last Dose  . acetaminophen (TYLENOL) 500 MG tablet Take 500 mg by mouth every 6 (six) hours as needed for mild pain.     Marland Kitchen. albuterol (PROAIR HFA) 108 (90 Base) MCG/ACT inhaler Inhale 2 puffs into the lungs every 6 (six) hours as needed for wheezing or shortness of breath. 1 each 1   .  levonorgestrel (MIRENA, 52 MG,) 20 MCG/24HR IUD 1 Intra Uterine Device (1 each total) by Intrauterine route once for 1 dose. 1 each 0   . montelukast (SINGULAIR) 10 MG tablet Take 1 tablet (10 mg total) by mouth at bedtime. (Patient taking differently: Take 10 mg by mouth as needed.) 30 tablet 3   . ondansetron (ZOFRAN) 4 MG tablet Take 1 tablet (4 mg total) by mouth every 8 (eight) hours as needed for nausea or vomiting. 30 tablet 0   . phentermine (ADIPEX-P) 37.5 MG tablet TAKE 1 TABLET (37.5 MG TOTAL) BY MOUTH EVERY MORNING. 30 tablet 0   . Spacer/Aero-Holding Chambers (E-Z SPACER) inhaler Use as instructed 1 each 2   . SUMAtriptan (IMITREX) 100 MG tablet Take 1 tablet PO at onset of migraine. Can repeat in 2 hours if migraine still present. Not to exceed 2 doses in 24 hours. 10 tablet 2   . topiramate (TOPAMAX) 50 MG tablet Take 1 tablet in morning and 2 tablets at bedtime 270 tablet 1   . valACYclovir (VALTREX) 1000 MG tablet 2000mg  (1 tabs) PO BID x 1 day. Take at onset of outbreak. 20 tablet 1   . Vitamin D, Ergocalciferol, (DRISDOL) 1.25 MG (50000 UNIT) CAPS capsule Take 1 capsule (50,000 Units total) by mouth every 7 (seven) days. 12 capsule 1    No current facility-administered medications for this visit.     Objective: Patient appears/sounds congested with cough.  They are in no apparent distress.  Breathing is non labored.  Mood and  behavior are normal.  Laboratory Data:  Recent Results (from the past 2160 hour(s))  Lipid panel     Status: Abnormal   Collection Time: 02/20/20 11:13 AM  Result Value Ref Range   Cholesterol 146 <200 mg/dL   HDL 40 (L) > OR = 50 mg/dL   Triglycerides 242 <353 mg/dL   LDL Cholesterol (Calc) 82 mg/dL (calc)    Comment: Reference range: <100 . Desirable range <100 mg/dL for primary prevention;   <70 mg/dL for patients with CHD or diabetic patients  with > or = 2 CHD risk factors. Marland Kitchen LDL-C is now calculated using the Martin-Hopkins  calculation,  which is a validated novel method providing  better accuracy than the Friedewald equation in the  estimation of LDL-C.  Horald Pollen et al. Lenox Ahr. 6144;315(40): 2061-2068  (http://education.[facility].com/faq/FAQ164)    Total CHOL/HDL Ratio 3.7 <5.0 (calc)   Non-HDL Cholesterol (Calc) 106 <130 mg/dL (calc)    Comment: For patients with diabetes plus 1 major ASCVD risk  factor, treating to a non-HDL-C goal of <100 mg/dL  (LDL-C of <08 mg/dL) is considered a therapeutic  option.   Vitamin D, 25-hydroxy     Status: Abnormal   Collection Time: 02/20/20 11:13 AM  Result Value Ref Range   Vit D, 25-Hydroxy 13 (L) 30 - 100 ng/mL    Comment: Vitamin D Status         25-OH Vitamin D: . Deficiency:                    <20 ng/mL Insufficiency:             20 - 29 ng/mL Optimal:                 > or = 30 ng/mL . For 25-OH Vitamin D testing on patients on  D2-supplementation and patients for whom quantitation  of D2 and D3 fractions is required, the QuestAssureD(TM) 25-OH VIT D, (D2,D3), LC/MS/MS is recommended: order  code 67619 (patients >73yrs). See Note 1 . Note 1 . For additional information, please refer to  http://education.[facility].com/faq/FAQ199  (This link is being provided for informational/ educational purposes only.)   TSH     Status: None   Collection Time: 02/20/20 11:13 AM  Result Value Ref Range   TSH 2.37 mIU/L    Comment:           Reference Range .           > or = 20 Years  0.40-4.50 .                Pregnancy Ranges           First trimester    0.26-2.66           Second trimester   0.55-2.73           Third trimester    0.43-2.91   Hemoglobin A1c     Status: Abnormal   Collection Time: 02/20/20 11:13 AM  Result Value Ref Range   Hgb A1c MFr Bld 5.9 (H) <5.7 % of total Hgb    Comment: For someone without known diabetes, a hemoglobin  A1c value between 5.7% and 6.4% is consistent with prediabetes and should be confirmed with a  follow-up  test. . For someone with known diabetes, a value <7% indicates that their diabetes is well controlled. A1c targets should be individualized based on duration of diabetes, age, comorbid conditions, and other considerations. . This assay result  is consistent with an increased risk of diabetes. . Currently, no consensus exists regarding use of hemoglobin A1c for diagnosis of diabetes for children. .    Mean Plasma Glucose 123 (calc)   eAG (mmol/L) 6.8 (calc)  CMP     Status: None   Collection Time: 02/20/20 11:13 AM  Result Value Ref Range   Glucose, Bld 86 65 - 99 mg/dL    Comment: .            Fasting reference interval .    BUN 12 7 - 25 mg/dL   Creat 5.03 5.46 - 5.68 mg/dL   BUN/Creatinine Ratio NOT APPLICABLE 6 - 22 (calc)   Sodium 136 135 - 146 mmol/L   Potassium 3.9 3.5 - 5.3 mmol/L   Chloride 105 98 - 110 mmol/L   CO2 21 20 - 32 mmol/L   Calcium 8.7 8.6 - 10.2 mg/dL   Total Protein 7.2 6.1 - 8.1 g/dL   Albumin 4.0 3.6 - 5.1 g/dL   Globulin 3.2 1.9 - 3.7 g/dL (calc)   AG Ratio 1.3 1.0 - 2.5 (calc)   Total Bilirubin 0.3 0.2 - 1.2 mg/dL   Alkaline phosphatase (APISO) 66 31 - 125 U/L   AST 16 10 - 30 U/L   ALT 15 6 - 29 U/L  CBC with Differential/Platelet     Status: None   Collection Time: 02/20/20 11:13 AM  Result Value Ref Range   WBC 10.3 3.8 - 10.8 Thousand/uL   RBC 4.21 3.80 - 5.10 Million/uL   Hemoglobin 12.3 11.7 - 15.5 g/dL   HCT 12.7 51.7 - 00.1 %   MCV 87.2 80.0 - 100.0 fL   MCH 29.2 27.0 - 33.0 pg   MCHC 33.5 32.0 - 36.0 g/dL   RDW 74.9 44.9 - 67.5 %   Platelets 278 140 - 400 Thousand/uL   MPV 11.2 7.5 - 12.5 fL   Neutro Abs 5,665 1,500 - 7,800 cells/uL   Lymphs Abs 3,646 850 - 3,900 cells/uL   Absolute Monocytes 752 200 - 950 cells/uL   Eosinophils Absolute 185 15 - 500 cells/uL   Basophils Absolute 52 0 - 200 cells/uL   Neutrophils Relative % 55 %   Total Lymphocyte 35.4 %   Monocytes Relative 7.3 %   Eosinophils Relative 1.8 %   Basophils  Relative 0.5 %     Assessment: 35 y.o. female with mild/moderate COVID 19 viral infection diagnosed on 04/13/20 at high risk for progression to severe COVID 19.  Plan:  This patient is a 35 y.o. female that meets the following criteria for Emergency Use Authorization of: Paxlovid 1. Age >12 yr AND > 40 kg 2. SARS-COV-2 positive test 3. Symptom onset < 5 days 4. Mild-to-moderate COVID disease with high risk for severe progression to hospitalization or death  I have spoken and communicated the following to the patient or parent/caregiver regarding: 1. Paxlovid is an unapproved drug that is authorized for use under an Emergency Use Authorization.  2. There are no adequate, approved, available products for the treatment of COVID-19 in adults who have mild-to-moderate COVID-19 and are at high risk for progressing to severe COVID-19, including hospitalization or death. 3. Other therapeutics are currently authorized. For additional information on all products authorized for treatment or prevention of COVID-19, please see https://www.graham-miller.com/.  4. There are benefits and risks of taking this treatment as outlined in the "Fact Sheet for Patients and Caregivers."  5. "Fact Sheet for Patients and Caregivers" was reviewed  with patient. A hard copy will be provided to patient from pharmacy prior to the patient receiving treatment. 6. Patients should continue to self-isolate and use infection control measures (e.g., wear mask, isolate, social distance, avoid sharing personal items, clean and disinfect "high touch" surfaces, and frequent handwashing) according to CDC guidelines.  7. The patient or parent/caregiver has the option to accept or refuse treatment. 8. Patient medication history was reviewed for potential drug interactions:Interaction with home meds: mirena - may be less effective 9. Patient's  creatinine clearance was calculated to be > 60, and they were therefore prescribed Normal dose (CrCl>60) - nirmatrelvir 150mg  tab (2 tablet) by mouth twice daily AND ritonavir 100mg  tab (1 tablet) by mouth twice daily   After reviewing above information with the patient, the patient agrees to receive Paxlovid.  Follow up instructions:    . Take prescription BID x 5 days as directed . Reach out to pharmacist for counseling on medication if desired . For concerns regarding further COVID symptoms please follow up with your PCP or urgent care . For urgent or life-threatening issues, seek care at your local emergency department  The patient was provided an opportunity to ask questions, and all were answered. The patient agreed with the plan and demonstrated an understanding of the instructions.   Script sent to [facility] and opted to pick up RX.  The patient was advised to call their PCP or seek an in-person evaluation if the symptoms worsen or if the condition fails to improve as anticipated.   I provided 15 minutes of non face-to-face telephone visit time during this encounter, and > 50% was spent counseling as documented under my assessment & plan.  Gaylene Moylan, [facility] 04/14/2020 /9:41 AM

## 2020-04-14 NOTE — Telephone Encounter (Signed)
Patient was prescribed oral covid treatment paxlovid and treatment note was reviewed. Medication has been received by [facility] and reviewed for appropriateness.  Drug Interactions or Dosage Adjustments Noted:   CrCl: >60  DDI: Mirena; patient educated to use secondary contraceptives during course of therapy   Delivery Method: Pick-up (by patient's mother)  Patient contacted for counseling on 04/14/2020 and verbalized understanding.   Delivery or Pick-Up Date: 04/14/2020   Marquette Old 04/14/2020, 10:16 AM [facility] Outpatient Pharmacist Phone# [phone]

## 2020-04-19 ENCOUNTER — Encounter: Payer: Self-pay | Admitting: Family Medicine

## 2020-04-20 ENCOUNTER — Other Ambulatory Visit: Payer: Self-pay | Admitting: Family Medicine

## 2020-04-20 MED ORDER — PROMETHAZINE HCL 25 MG PO TABS: 25.0000 mg | ORAL_TABLET | Freq: Three times a day (TID) | ORAL | 0 refills | Status: DC | PRN

## 2020-04-23 ENCOUNTER — Encounter: Payer: Self-pay | Admitting: Family Medicine

## 2020-04-24 ENCOUNTER — Encounter: Payer: Self-pay | Admitting: Adult Health

## 2020-04-24 ENCOUNTER — Telehealth (INDEPENDENT_AMBULATORY_CARE_PROVIDER_SITE_OTHER): Payer: 59 | Admitting: Adult Health

## 2020-04-24 VITALS — Temp 99.9°F | Wt 264.0 lb

## 2020-04-24 DIAGNOSIS — U071 COVID-19: Secondary | ICD-10-CM

## 2020-04-24 DIAGNOSIS — R0602 Shortness of breath: Secondary | ICD-10-CM

## 2020-04-24 MED ORDER — PREDNISONE 20 MG PO TABS: 20.0000 mg | ORAL_TABLET | Freq: Every day | ORAL | 0 refills | Status: DC

## 2020-04-24 NOTE — Progress Notes (Signed)
Virtual Visit via Video Note  I connected with Debra Parker on 04/24/20 at  1:00 PM EST by a video enabled telemedicine application and verified that I am speaking with the correct person using two identifiers.  Location patient: home Location provider:work or home office Persons participating in the virtual visit: patient, provider  I discussed the limitations of evaluation and management by telemedicine and the availability of in person appointments. The patient expressed understanding and agreed to proceed.   HPI: 35 year old female who is being evaluated today for follow-up regarding COVID-19 infection.  Today is day 13 of symptoms.  Her symptoms started January 5 while at work and she tested positive by health at work.  After 10 days health at work following her.  She reports that she continues to have intermittent fever up to 99 degrees, nausea, fatigue, worsening migraines, and some mild shortness of breath and wheezing.  She has been in touch with her PCP who has prescribed her Imitrex and Phenergan, but she is unable to return to work as these make her sleepy.  She did do antiviral oral medication and believes that this helped but came with side effects such as worsening migraines, nausea, and diarrhea.  Does not feel as though she is able to return to work yet due to the way she feels.   ROS: See pertinent positives and negatives per HPI.  Past Medical History:  Diagnosis Date  . Bronchitis   . Diverticulitis 2019  . Hypertension    when pregnant  . Migraines     Past Surgical History:  Procedure Laterality Date  . CESAREAN SECTION  2007  . fatty pocket removed  1999  . WISDOM TOOTH EXTRACTION  2000    Family History  Problem Relation Age of Onset  . Diabetes Father   . Hyperlipidemia Father   . Early death Sister        stillborn  . Heart disease Brother   . Heart attack Brother 40  . Arthritis Maternal Grandmother   . Diabetes Maternal Grandmother   . Heart  attack Maternal Grandmother   . Throat cancer Maternal Grandfather   . Diabetes Paternal Grandfather   . Stroke Paternal Grandfather        Current Outpatient Medications:  .  acetaminophen (TYLENOL) 500 MG tablet, Take 500 mg by mouth every 6 (six) hours as needed for mild pain., Disp: , Rfl:  .  albuterol (PROAIR HFA) 108 (90 Base) MCG/ACT inhaler, Inhale 2 puffs into the lungs every 6 (six) hours as needed for wheezing or shortness of breath., Disp: 1 each, Rfl: 1 .  montelukast (SINGULAIR) 10 MG tablet, Take 1 tablet (10 mg total) by mouth at bedtime. (Patient taking differently: Take 10 mg by mouth as needed.), Disp: 30 tablet, Rfl: 3 .  ondansetron (ZOFRAN) 4 MG tablet, Take 1 tablet (4 mg total) by mouth every 8 (eight) hours as needed for nausea or vomiting., Disp: 30 tablet, Rfl: 0 .  phentermine (ADIPEX-P) 37.5 MG tablet, TAKE 1 TABLET (37.5 MG TOTAL) BY MOUTH EVERY MORNING., Disp: 30 tablet, Rfl: 0 .  predniSONE (DELTASONE) 20 MG tablet, Take 1 tablet (20 mg total) by mouth daily with breakfast., Disp: 7 tablet, Rfl: 0 .  promethazine (PHENERGAN) 25 MG tablet, Take 1 tablet (25 mg total) by mouth every 8 (eight) hours as needed for nausea or vomiting., Disp: 20 tablet, Rfl: 0 .  Spacer/Aero-Holding Chambers (E-Z SPACER) inhaler, Use as instructed, Disp: 1 each, Rfl: 2 .  SUMAtriptan (IMITREX) 100 MG tablet, Take 1 tablet PO at onset of migraine. Can repeat in 2 hours if migraine still present. Not to exceed 2 doses in 24 hours., Disp: 10 tablet, Rfl: 2 .  topiramate (TOPAMAX) 50 MG tablet, Take 1 tablet in morning and 2 tablets at bedtime, Disp: 270 tablet, Rfl: 1 .  valACYclovir (VALTREX) 1000 MG tablet, 2000mg  (1 tabs) PO BID x 1 day. Take at onset of outbreak., Disp: 20 tablet, Rfl: 1 .  Vitamin D, Ergocalciferol, (DRISDOL) 1.25 MG (50000 UNIT) CAPS capsule, Take 1 capsule (50,000 Units total) by mouth every 7 (seven) days., Disp: 12 capsule, Rfl: 1 .  levonorgestrel (MIRENA, 52  MG,) 20 MCG/24HR IUD, 1 Intra Uterine Device (1 each total) by Intrauterine route once for 1 dose., Disp: 1 each, Rfl: 0  EXAM:  VITALS per patient if applicable:  GENERAL: alert, oriented, appears well and in no acute distress  HEENT: atraumatic, conjunttiva clear, no obvious abnormalities on inspection of external nose and ears  NECK: normal movements of the head and neck  LUNGS: on inspection no signs of respiratory distress, breathing rate appears normal, no obvious gross SOB, gasping or wheezing  CV: no obvious cyanosis  MS: moves all visible extremities without noticeable abnormality  PSYCH/NEURO: pleasant and cooperative, no obvious depression or anxiety, speech and thought processing grossly intact  ASSESSMENT AND PLAN:  Discussed the following assessment and plan:  1. COVID-19 virus infection -We will place work note in my chart, can return to work 24th 2022.  She was advised to follow-up if symptoms have not improved over the next week.  We will send in course of 20 mg prednisone x7 days to help with shortness of breath and wheezing.  Does not appear to have COVID-pneumonia  2. SOB (shortness of breath)  - predniSONE (DELTASONE) 20 MG tablet; Take 1 tablet (20 mg total) by mouth daily with breakfast.  Dispense: 7 tablet; Refill: 0    I discussed the assessment and treatment plan with the patient. The patient was provided an opportunity to ask questions and all were answered. The patient agreed with the plan and demonstrated an understanding of the instructions.   The patient was advised to call back or seek an in-person evaluation if the symptoms worsen or if the condition fails to improve as anticipated.   1975, NP

## 2020-04-25 ENCOUNTER — Telehealth: Payer: Self-pay | Admitting: Family Medicine

## 2020-04-25 NOTE — Telephone Encounter (Signed)
Patient would like to know of Dr. Hassan Rowan would be refilling her phentermine at her physical visit.  Please advise.

## 2020-04-26 NOTE — Telephone Encounter (Signed)
We can discuss at her appointment. She has been out with COVID and we didn't get the chance to do the 1 month follow up in office that I typically do when restarting phentermine. I don't think it is good to take when she is sick, but when feeling better if she feels she needs to restart prior to the set appointment in March; we can see about switching her to earlier visit in person where we can get weight recheck and review meds. Phentermine does not come without side effects, so I try to monitor closely to make sure pros outweight cons.

## 2020-04-26 NOTE — Telephone Encounter (Signed)
Patient informed of the message below.

## 2020-04-27 ENCOUNTER — Encounter: Payer: 59 | Admitting: Family Medicine

## 2020-05-02 ENCOUNTER — Telehealth: Payer: Self-pay | Admitting: Family Medicine

## 2020-05-02 ENCOUNTER — Encounter: Payer: Self-pay | Admitting: Family Medicine

## 2020-05-02 NOTE — Telephone Encounter (Signed)
Pt call and want a call back when dr.Koberlien get her paper from [employer].

## 2020-05-02 NOTE — Telephone Encounter (Signed)
Mychart message sent.

## 2020-05-08 ENCOUNTER — Encounter: Payer: Self-pay | Admitting: Family Medicine

## 2020-05-09 NOTE — Telephone Encounter (Signed)
Fmla paperwork completed and returned to your desk

## 2020-05-10 NOTE — Telephone Encounter (Signed)
Patient called yesterday.

## 2020-05-18 ENCOUNTER — Ambulatory Visit (INDEPENDENT_AMBULATORY_CARE_PROVIDER_SITE_OTHER): Payer: 59 | Admitting: Neurology

## 2020-05-18 DIAGNOSIS — G4720 Circadian rhythm sleep disorder, unspecified type: Secondary | ICD-10-CM

## 2020-05-18 DIAGNOSIS — R0683 Snoring: Secondary | ICD-10-CM

## 2020-05-18 DIAGNOSIS — R519 Headache, unspecified: Secondary | ICD-10-CM

## 2020-05-18 DIAGNOSIS — G4733 Obstructive sleep apnea (adult) (pediatric): Secondary | ICD-10-CM | POA: Diagnosis not present

## 2020-05-24 DIAGNOSIS — Z6841 Body Mass Index (BMI) 40.0 and over, adult: Secondary | ICD-10-CM | POA: Diagnosis not present

## 2020-05-24 DIAGNOSIS — Z01419 Encounter for gynecological examination (general) (routine) without abnormal findings: Secondary | ICD-10-CM | POA: Diagnosis not present

## 2020-05-24 DIAGNOSIS — Z3040 Encounter for surveillance of contraceptives, unspecified: Secondary | ICD-10-CM | POA: Diagnosis not present

## 2020-05-25 ENCOUNTER — Encounter: Payer: Self-pay | Admitting: Family Medicine

## 2020-05-29 ENCOUNTER — Encounter: Payer: Self-pay | Admitting: Neurology

## 2020-05-29 NOTE — Procedures (Signed)
PATIENT'S NAME:  Debra Parker, Debra Parker DOB:      1986/02/21      MR#:    [id number]     DATE OF RECORDING: 05/18/2020 REFERRING M.D.:  Theodis Shove, MD Study Performed:   Baseline Polysomnogram HISTORY: 35 year old woman with a history of hypertension, diverticulitis, bronchitis, migraine headaches, and severe obesity with a BMI of over 40, who reports snoring and worsening migraine headaches, occasional daytime tiredness and lack of energy, also morning headaches. The patient endorsed the Epworth Sleepiness Scale at 3/24 points. The patient's weight 264 pounds with a height of 66 (inches), resulting in a BMI of 42.5 kg/m2. The patient's neck circumference measured 17 inches.  CURRENT MEDICATIONS: Valtex, Vitamin D, Mirena   PROCEDURE:  This is a multichannel digital polysomnogram utilizing the Somnostar 11.2 system.  Electrodes and sensors were applied and monitored per AASM Specifications.   EEG, EOG, Chin and Limb EMG, were sampled at 200 Hz.  ECG, Snore and Nasal Pressure, Thermal Airflow, Respiratory Effort, CPAP Flow and Pressure, Oximetry was sampled at 50 Hz. Digital video and audio were recorded.      BASELINE STUDY  Lights Out was at 21:10 and Lights On at 05:02.  Total recording time (TRT) was 472.5 minutes, with a total sleep time (TST) of 447 minutes.   The patient's sleep latency was 10 minutes.  REM latency was 200 minutes, which is delayed. The sleep efficiency was 94.6%.     SLEEP ARCHITECTURE: WASO (Wake after sleep onset) was 15.5 minutes.  There were 8.5 minutes in Stage N1, 304.5 minutes Stage N2, 74 minutes Stage N3 and 60 minutes in Stage REM.  The percentage of Stage N1 was 1.9%, Stage N2 was 68.1%, which is increased, Stage N3 was 16.6% and Stage R (REM sleep) was 13.4%, which is reduced. The arousals were noted as: 149 were spontaneous, 0 were associated with PLMs, 41 were associated with respiratory events.  RESPIRATORY ANALYSIS:  There were a total of 63 respiratory  events:  9 obstructive apneas, 0 central apneas and 0 mixed apneas with a total of 9 apneas and an apnea index (AI) of 1.2 /hour. There were 54 hypopneas with a hypopnea index of 7.2 /hour. The patient also had 0 respiratory event related arousals (RERAs).      The total APNEA/HYPOPNEA INDEX (AHI) was 8.5/hour and the total RESPIRATORY DISTURBANCE INDEX was  8.5 /hour.  23 events occurred in REM sleep and 80 events in NREM. The REM AHI was  23 /hour, versus a non-REM AHI of 6.2. The patient spent 149 minutes of total sleep time in the supine position and 298 minutes in non-supine.. The supine AHI was 13.3 versus a non-supine AHI of 6.0.  OXYGEN SATURATION & C02:  The Wake baseline 02 saturation was 98%, with the lowest being 77%. Time spent below 89% saturation equaled 22 minutes.  PERIODIC LIMB MOVEMENTS: The patient had a total of 0 Periodic Limb Movements.  The Periodic Limb Movement (PLM) index was 0 and the PLM Arousal index was 0/hour.  Audio and video analysis did not show any abnormal or unusual movements, behaviors, phonations or vocalizations. The patient took no bathroom breaks. Mild to moderate snoring was noted. The EKG was in keeping with normal sinus rhythm (NSR).  Post-study, the patient indicated that sleep was the same as usual.   IMPRESSION:  1. Obstructive Sleep Apnea (OSA) 2. Dysfunctions associated with sleep stages or arousal from sleep  RECOMMENDATIONS:  1. This study demonstrates mild to  moderate obstructive sleep apnea, with a total AHI of 8.5/hour, REM AHI of 23/hour, supine AHI of 13.3/hour and O2 nadir of 77%. Given the patient's medical history and sleep related complaints, treatment with positive airway pressure is recommended; this can be achieved in the form of autoPAP. Alternatively, a full-night CPAP titration study would allow optimization of therapy if needed. Other treatment options may include avoidance of supine sleep position along with weight loss, upper  airway or jaw surgery in selected patients or the use of an oral appliance in certain patients. ENT evaluation and/or consultation with a maxillofacial surgeon or dentist may be feasible in some instances.    2. Please note that untreated obstructive sleep apnea may carry additional perioperative morbidity. Patients with significant obstructive sleep apnea should receive perioperative PAP therapy and the surgeons and particularly the anesthesiologist should be informed of the diagnosis and the severity of the sleep disordered breathing.  3. This study shows mildly abnormal sleep stage percentages; these are nonspecific findings and per se do not signify an intrinsic sleep disorder or a cause for the patient's sleep-related symptoms. Causes include (but are not limited to) the first night effect of the sleep study, circadian rhythm disturbances, medication effect or an underlying mood disorder or medical problem.  4. The patient should be cautioned not to drive, work at heights, or operate dangerous or heavy equipment when tired or sleepy. Review and reiteration of good sleep hygiene measures should be pursued with any patient. 5. The patient will be seen in follow-up by Dr. Frances Furbish at [facility] for discussion of the test results and further management strategies. The referring provider will be notified of the test results.  I certify that I have reviewed the entire raw data recording prior to the issuance of this report in accordance with the Standards of Accreditation of the American Academy of Sleep Medicine (AASM)   Huston Foley, MD, PhD Diplomat, American Board of Neurology and Sleep Medicine (Neurology and Sleep Medicine)

## 2020-05-29 NOTE — Addendum Note (Signed)
Addended by: Huston Foley on: 05/29/2020 05:56 PM   Modules accepted: Orders

## 2020-05-29 NOTE — Progress Notes (Signed)
Patient referred by Dr. Hassan Rowan, seen by me on 03/21/20, diagnostic PSG on 05/18/20.    Please call and notify the patient that the recent sleep study showed mild to moderate obstructive sleep apnea, and worth treating to see if she feels better after treatment, eg with regards to her HAs. To that end I recommend treatment for this in the form of autoPAP, which means, that we don't have to bring her back for a second sleep study with CPAP, but will let him try an autoPAP machine at home, through a DME company (of her choice, or as per insurance requirement). The DME representative will educate her on how to use the machine, how to put the mask on, etc. I have placed an order in the chart. Please send referral, talk to patient, send report to referring MD. We will need a FU in sleep clinic for 10 weeks post-PAP set up, please arrange that with me or one of our NPs. Thanks,   Huston Foley, MD, PhD [facility])

## 2020-05-30 ENCOUNTER — Telehealth: Payer: Self-pay

## 2020-05-30 NOTE — Telephone Encounter (Signed)
-----   Message from Huston Foley, MD sent at 05/29/2020  5:56 PM EST ----- Patient referred by Dr. Hassan Rowan, seen by me on 03/21/20, diagnostic PSG on 05/18/20.    Please call and notify the patient that the recent sleep study showed mild to moderate obstructive sleep apnea, and worth treating to see if she feels better after treatment, eg with regards to her HAs. To that end I recommend treatment for this in the form of autoPAP, which means, that we don't have to bring her back for a second sleep study with CPAP, but will let him try an autoPAP machine at home, through a DME company (of her choice, or as per insurance requirement). The DME representative will educate her on how to use the machine, how to put the mask on, etc. I have placed an order in the chart. Please send referral, talk to patient, send report to referring MD. We will need a FU in sleep clinic for 10 weeks post-PAP set up, please arrange that with me or one of our NPs. Thanks,   Huston Foley, MD, PhD [facility])

## 2020-05-30 NOTE — Telephone Encounter (Signed)
I attempted to call pt and review results of sleep study, pt vm was full.  Will try again at a later time.

## 2020-06-26 ENCOUNTER — Other Ambulatory Visit: Payer: Self-pay

## 2020-06-27 ENCOUNTER — Ambulatory Visit (INDEPENDENT_AMBULATORY_CARE_PROVIDER_SITE_OTHER): Payer: 59 | Admitting: Family Medicine

## 2020-06-27 ENCOUNTER — Other Ambulatory Visit: Payer: Self-pay | Admitting: Family Medicine

## 2020-06-27 ENCOUNTER — Encounter: Payer: Self-pay | Admitting: Family Medicine

## 2020-06-27 VITALS — BP 102/82 | HR 71 | Temp 98.2°F | Ht 64.25 in | Wt 259.7 lb

## 2020-06-27 DIAGNOSIS — L661 Lichen planopilaris: Secondary | ICD-10-CM | POA: Diagnosis not present

## 2020-06-27 DIAGNOSIS — Z0000 Encounter for general adult medical examination without abnormal findings: Secondary | ICD-10-CM

## 2020-06-27 DIAGNOSIS — G4730 Sleep apnea, unspecified: Secondary | ICD-10-CM | POA: Diagnosis not present

## 2020-06-27 DIAGNOSIS — R739 Hyperglycemia, unspecified: Secondary | ICD-10-CM

## 2020-06-27 DIAGNOSIS — G43809 Other migraine, not intractable, without status migrainosus: Secondary | ICD-10-CM | POA: Diagnosis not present

## 2020-06-27 DIAGNOSIS — B001 Herpesviral vesicular dermatitis: Secondary | ICD-10-CM | POA: Diagnosis not present

## 2020-06-27 LAB — POCT GLYCOSYLATED HEMOGLOBIN (HGB A1C): Hemoglobin A1C: 5.8 % — AB (ref 4.0–5.6)

## 2020-06-27 MED ORDER — VALACYCLOVIR HCL 1 G PO TABS: ORAL_TABLET | ORAL | 1 refills | Status: DC

## 2020-06-27 MED ORDER — TOPIRAMATE 50 MG PO TABS: ORAL_TABLET | ORAL | 1 refills | Status: DC

## 2020-06-27 MED ORDER — PHENTERMINE HCL 37.5 MG PO TABS: ORAL_TABLET | ORAL | 1 refills | Status: DC

## 2020-06-27 NOTE — Patient Instructions (Addendum)
Try citrucel daily to see if this helps with more regular bowel movements. Make sure keeping up with water intake.   Once you finish vitamin D; just start OTC 2,000 units daily.

## 2020-06-27 NOTE — Progress Notes (Signed)
Debra Parker DOB: 21-Jul-1985 Encounter date: 06/27/2020  This is a 35 y.o. female who presents for complete physical   History of present illness/Additional concerns:  Feeling well overall.   Obesity: Phentermine prescribed in December to help with weight loss/appetite control. She was doing well with this and was down in 240's. Then got COVID and couldn't follow up, so has been on it for awhile. She was dx with mild osa, she is supposed to get machine, but not sure about wait time. She was waking up with headaches which she thought may be related. When she got eyes checked last they told her vision had changed significantly for the better. Then got rx and had starburst in vision; then things got blurry; so they decided to step down glasses half way. Not sure how much this causes headaches. Couldn't see out of new rx and everything was blurry.   Vitamin D deficiency: Last vitamin D level was 13 on 02/20/2020. When sick she stopped doing this supplement but has restarted.   Migraine: Topamax 50 mg in the morning, 100 mg at bedtime.  Sumatriptan as needed. See above. Generally not worse than before.   Impaired fasting glucose: last A1c 4 months ago was 5.9. she is working on more regular activity; stationary pedals at work; eating more salads.   Follows with [facility] for gynecology needs.  Hair is coming back in better - started to fall out some when she was sick.   Past Medical History:  Diagnosis Date  . Bronchitis   . Diverticulitis 2019  . Hypertension    when pregnant  . Migraines    Past Surgical History:  Procedure Laterality Date  . CESAREAN SECTION  2007  . fatty pocket removed  1999  . WISDOM TOOTH EXTRACTION  2000   Allergies  Allergen Reactions  . Aimovig [Erenumab-Aooe] Hives  . Sulfa Antibiotics Other (See Comments)    Not sure.   Current Meds  Medication Sig  . acetaminophen (TYLENOL) 500 MG tablet Take 500 mg by mouth every 6 (six) hours  as needed for mild pain.  Marland Kitchen albuterol (PROAIR HFA) 108 (90 Base) MCG/ACT inhaler Inhale 2 puffs into the lungs every 6 (six) hours as needed for wheezing or shortness of breath.  . Spacer/Aero-Holding Chambers (E-Z SPACER) inhaler Use as instructed  . SUMAtriptan (IMITREX) 100 MG tablet Take 1 tablet PO at onset of migraine. Can repeat in 2 hours if migraine still present. Not to exceed 2 doses in 24 hours.  . Vitamin D, Ergocalciferol, (DRISDOL) 1.25 MG (50000 UNIT) CAPS capsule Take 1 capsule (50,000 Units total) by mouth every 7 (seven) days.  . [DISCONTINUED] phentermine (ADIPEX-P) 37.5 MG tablet TAKE 1 TABLET (37.5 MG TOTAL) BY MOUTH EVERY MORNING.  . [DISCONTINUED] topiramate (TOPAMAX) 50 MG tablet Take 1 tablet in morning and 2 tablets at bedtime  . [DISCONTINUED] valACYclovir (VALTREX) 1000 MG tablet 2000mg  (1 tabs) PO BID x 1 day. Take at onset of outbreak.   Social History   Tobacco Use  . Smoking status: Never Smoker  . Smokeless tobacco: Never Used  Substance Use Topics  . Alcohol use: No   Family History  Problem Relation Age of Onset  . Diabetes Father   . Hyperlipidemia Father   . Early death Sister        stillborn  . Heart disease Brother   . Heart attack Brother 40  . Arthritis Maternal Grandmother   . Diabetes Maternal Grandmother   .  Heart attack Maternal Grandmother   . Throat cancer Maternal Grandfather   . Diabetes Paternal Grandfather   . Stroke Paternal Grandfather      Review of Systems  Constitutional: Negative for activity change, appetite change, chills, fatigue, fever and unexpected weight change.  HENT: Negative for congestion, ear pain, hearing loss, sinus pressure, sinus pain, sore throat and trouble swallowing.   Eyes: Negative for pain and visual disturbance.  Respiratory: Negative for cough, chest tightness, shortness of breath and wheezing.   Cardiovascular: Negative for chest pain, palpitations and leg swelling.  Gastrointestinal: Negative  for abdominal pain, blood in stool, constipation, diarrhea, nausea and vomiting.  Genitourinary: Negative for difficulty urinating and menstrual problem.  Musculoskeletal: Negative for arthralgias and back pain.  Skin: Negative for rash.  Neurological: Negative for dizziness, weakness, numbness and headaches.  Hematological: Negative for adenopathy. Does not bruise/bleed easily.  Psychiatric/Behavioral: Negative for sleep disturbance and suicidal ideas. The patient is not nervous/anxious.     CBC:  Lab Results  Component Value Date   WBC 10.3 02/20/2020   HGB 12.3 02/20/2020   HCT 36.7 02/20/2020   MCH 29.2 02/20/2020   MCHC 33.5 02/20/2020   RDW 12.0 02/20/2020   PLT 278 02/20/2020   MPV 11.2 02/20/2020   CMP: Lab Results  Component Value Date   NA 136 02/20/2020   K 3.9 02/20/2020   CL 105 02/20/2020   CO2 21 02/20/2020   ANIONGAP 7 10/18/2017   GLUCOSE 86 02/20/2020   BUN 12 02/20/2020   CREATININE 0.64 02/20/2020   GFRAA >60 10/18/2017   CALCIUM 8.7 02/20/2020   PROT 7.2 02/20/2020   BILITOT 0.3 02/20/2020   ALKPHOS 58 11/30/2018   ALT 15 02/20/2020   AST 16 02/20/2020   LIPID: Lab Results  Component Value Date   CHOL 146 02/20/2020   TRIG 141 02/20/2020   HDL 40 (L) 02/20/2020   LDLCALC 82 02/20/2020    Objective:  BP 102/82 (BP Location: Left Arm, Patient Position: Sitting, Cuff Size: Large)   Pulse 71   Temp 98.2 F (36.8 C) (Oral)   Ht 5' 4.25" (1.632 m)   Wt 259 lb 11.2 oz (117.8 kg)   SpO2 99%   BMI 44.23 kg/m   Weight: 259 lb 11.2 oz (117.8 kg)   BP Readings from Last 3 Encounters:  06/27/20 102/82  03/21/20 127/81  02/17/20 104/78   Wt Readings from Last 3 Encounters:  06/27/20 259 lb 11.2 oz (117.8 kg)  04/24/20 264 lb (119.7 kg)  03/21/20 264 lb (119.7 kg)    Physical Exam Constitutional:      General: She is not in acute distress.    Appearance: She is well-developed.  HENT:     Head: Normocephalic and atraumatic.     Right  Ear: External ear normal.     Left Ear: External ear normal.     Mouth/Throat:     Pharynx: No oropharyngeal exudate.  Eyes:     Conjunctiva/sclera: Conjunctivae normal.     Pupils: Pupils are equal, round, and reactive to light.  Neck:     Thyroid: No thyromegaly.  Cardiovascular:     Rate and Rhythm: Normal rate and regular rhythm.     Heart sounds: Normal heart sounds. No murmur heard. No friction rub. No gallop.   Pulmonary:     Effort: Pulmonary effort is normal.     Breath sounds: Normal breath sounds.  Abdominal:     General: Bowel sounds are normal.  There is no distension.     Palpations: Abdomen is soft. There is no mass.     Tenderness: There is no abdominal tenderness. There is no guarding.     Hernia: No hernia is present.  Musculoskeletal:        General: No tenderness or deformity. Normal range of motion.     Cervical back: Normal range of motion and neck supple.  Lymphadenopathy:     Cervical: No cervical adenopathy.  Skin:    General: Skin is warm and dry.     Findings: No rash.  Neurological:     Mental Status: She is alert and oriented to person, place, and time.     Deep Tendon Reflexes: Reflexes normal.     Reflex Scores:      Tricep reflexes are 2+ on the right side and 2+ on the left side.      Bicep reflexes are 2+ on the right side and 2+ on the left side.      Brachioradialis reflexes are 2+ on the right side and 2+ on the left side.      Patellar reflexes are 2+ on the right side and 2+ on the left side. Psychiatric:        Speech: Speech normal.        Behavior: Behavior normal.        Thought Content: Thought content normal.     Assessment/Plan: Health Maintenance Due  Topic Date Due  . Hepatitis C Screening  Never done   Health Maintenance reviewed.  1. Preventative health care Keep working on increasing activity level and low carb/fat/cal diet.   2. Other migraine without status migrainosus, not intractable Stable with medications;  will see if there is improvement with new glasses rx and with sleep apnea correction. - topiramate (TOPAMAX) 50 MG tablet; Take 1 tablet in morning and 2 tablets at bedtime  Dispense: 270 tablet; Refill: 1  3. Hyperglycemia Stable from prior. - POC HgB A1c  4. Morbid obesity (HCC) Has had weight loss. Restart medication. 2 mo follow up for weight recheck. - phentermine (ADIPEX-P) 37.5 MG tablet; TAKE 1 TABLET (37.5 MG TOTAL) BY MOUTH EVERY MORNING.  Dispense: 30 tablet; Refill: 1  5. Lichen planopilaris Following with derm regularly.  6. Sleep apnea: mild sx; awaiting machine. She is hoping that weight loss will make machine not necessary.   7. Cold sore - valACYclovir (VALTREX) 1000 MG tablet; 2000mg  (1 tabs) PO BID x 1 day. Take at onset of outbreak.  Dispense: 20 tablet; Refill: 1  Return in about 2 months (around 08/27/2020).  08/29/2020, MD

## 2020-08-02 ENCOUNTER — Other Ambulatory Visit (HOSPITAL_COMMUNITY): Payer: Self-pay

## 2020-08-02 ENCOUNTER — Encounter: Payer: Self-pay | Admitting: Family Medicine

## 2020-08-02 MED FILL — Doxycycline Monohydrate Tab 50 MG: ORAL | 30 days supply | Qty: 30 | Fill #0 | Status: AC

## 2020-08-23 ENCOUNTER — Other Ambulatory Visit: Payer: Self-pay

## 2020-08-24 ENCOUNTER — Ambulatory Visit: Payer: 59 | Admitting: Family Medicine

## 2020-08-24 ENCOUNTER — Encounter: Payer: Self-pay | Admitting: Family Medicine

## 2020-08-24 VITALS — BP 100/68 | HR 78 | Temp 98.6°F | Ht 64.25 in | Wt 265.2 lb

## 2020-08-24 DIAGNOSIS — L0291 Cutaneous abscess, unspecified: Secondary | ICD-10-CM

## 2020-08-24 MED ORDER — DOXYCYCLINE HYCLATE 100 MG PO TABS: 100.0000 mg | ORAL_TABLET | Freq: Two times a day (BID) | ORAL | 0 refills | Status: AC

## 2020-08-24 NOTE — Patient Instructions (Addendum)
I will check in with you on Monday - to see how neck is feeling. Call me if any worsening. Heat 20 min a couple of times daily.   Start rybelsus as directed after this infection has cleared.

## 2020-08-24 NOTE — Progress Notes (Signed)
Debra Parker DOB: 05/02/1985 Encounter date: 08/24/2020  This is a 35 y.o. female who presents with Chief Complaint  Patient presents with  . Rash    Right side of the neck x6 days  . Cyst    Patient complains of a painful knot along the right side of her neck 6 days ago    History of present illness:  Rash started on back of neck - thought from new necklace - but now has knot under where rash was. Knot is sore and getting some achiness into arm.   She is interested in trying something else to help with weight loss. Phentermine didn't seem to help much. Interested in some of the newer weight loss medications like wygovy.  Allergies  Allergen Reactions  . Aimovig [Erenumab-Aooe] Hives  . Sulfa Antibiotics Other (See Comments)    Not sure.   Current Meds  Medication Sig  . acetaminophen (TYLENOL) 500 MG tablet Take 500 mg by mouth every 6 (six) hours as needed for mild pain.  Marland Kitchen albuterol (PROAIR HFA) 108 (90 Base) MCG/ACT inhaler Inhale 2 puffs into the lungs every 6 (six) hours as needed for wheezing or shortness of breath.  . doxycycline (ADOXA) 50 MG tablet TAKE 1 TABLET BY MOUTH DAILY; TAKE WITH FOOD AND WATER, USE SUN PROTECTION.  Marland Kitchen doxycycline (VIBRA-TABS) 100 MG tablet Take 1 tablet (100 mg total) by mouth 2 (two) times daily for 7 days.  Blanchie Serve & Ritonavir 20 x 150 MG & 10 x 100MG  TBPK TAKE 3 TABLETS BY MOUTH 2 (TWO) TIMES DAILY FOR 5 DAYS.  Spacer/Aero-Holding Chambers (E-Z SPACER) inhaler Use as instructed  . SUMAtriptan (IMITREX) 100 MG tablet TAKE 1 TABLET BY MOUTH AT ONSET OF MIGRAINE. CAN REPEAT IN 2 HOURS IF MIGRAINE STILL PRESENT. NOT TO EXCEED 2 DOSES IN 24 HOURS.  Marland Kitchen topiramate (TOPAMAX) 50 MG tablet TAKE 1 TABLET BY MOUTH IN THE MORNING AND 2 TABLETS BY MOUTH AT BEDTIME (Patient taking differently: Take 100 mg by mouth 2 (two) times daily.)  . valACYclovir (VALTREX) 1000 MG tablet TAKE 2 TABLETS (2000MG ) BY MOUTH 2 TIMES DAILY FOR 1 DAY. TAKE AT  ONSET OF OUTBREAK.  Marland Kitchen VITAMIN D PO Take by mouth.  . [DISCONTINUED] doxycycline (VIBRA-TABS) 100 MG tablet TAKE 1 TABLET (100 MG TOTAL) BY MOUTH 2 (TWO) TIMES DAILY FOR 5 DAYS.  . [DISCONTINUED] phentermine (ADIPEX-P) 37.5 MG tablet TAKE 1 TABLET (37.5 MG TOTAL) BY MOUTH EVERY MORNING.  . [DISCONTINUED] Vitamin D, Ergocalciferol, (DRISDOL) 1.25 MG (50000 UNIT) CAPS capsule TAKE 1 CAPSULE (50,000 UNITS TOTAL) BY MOUTH EVERY 7 (SEVEN) DAYS.    Review of Systems  Constitutional: Negative for chills, fatigue and fever.  HENT: Negative for congestion.   Respiratory: Negative for cough, chest tightness, shortness of breath and wheezing.   Cardiovascular: Negative for chest pain, palpitations and leg swelling.    Objective:  BP 100/68 (BP Location: Left Arm, Patient Position: Sitting, Cuff Size: Large)   Pulse 78   Temp 98.6 F (37 C) (Oral)   Ht 5' 4.25" (1.632 m)   Wt 265 lb 3.2 oz (120.3 kg)   BMI 45.17 kg/m   Weight: 265 lb 3.2 oz (120.3 kg)   BP Readings from Last 3 Encounters:  08/24/20 100/68  06/27/20 102/82  03/21/20 127/81   Wt Readings from Last 3 Encounters:  08/24/20 265 lb 3.2 oz (120.3 kg)  06/27/20 259 lb 11.2 oz (117.8 kg)  04/24/20 264 lb (119.7 kg)  Physical Exam Constitutional:      General: She is not in acute distress.    Appearance: She is well-developed.  Cardiovascular:     Rate and Rhythm: Normal rate and regular rhythm.     Heart sounds: No murmur heard. No friction rub.  Pulmonary:     Effort: Pulmonary effort is normal.  Chest:  Breasts:     Right: No supraclavicular adenopathy.     Left: No supraclavicular adenopathy.    Musculoskeletal:     Right lower leg: No edema.     Left lower leg: No edema.  Lymphadenopathy:     Head:     Right side of head: No submental, submandibular, preauricular or posterior auricular adenopathy.     Left side of head: No submental, submandibular, preauricular or posterior auricular adenopathy.      Cervical: Cervical adenopathy present.     Right cervical: Posterior cervical adenopathy present.     Upper Body:     Right upper body: No supraclavicular adenopathy.     Left upper body: No supraclavicular adenopathy.     Comments: 2 posterior cervical nodules appreciated - 0.5cm in size.   Along right side of neck approx 4 cm slightly mobile, tender mass palpated. It is not along lymph chains and seems to sit more horizontal on right lateral neck. It is not fluctuant to touch. No surrounding erythema or warmth.   Neurological:     Mental Status: She is alert and oriented to person, place, and time.  Psychiatric:        Behavior: Behavior normal.     Assessment/Plan  1. Abscess I do feel that this is infection - due to abrupt onset, tenderness, position of nodule. We will treat with antibiotic course and I advised warm compresses multiple times a day.  I have asked her to update me through MyChart on Monday (I have sent message to her so she can just respond to this) or let us know sooner if any worsening.  I do not feel that this can be drained at present, so we will just have to monitor.  Follow-up pending Monday report.  2. Morbid obesity (HCC) She has struggled with weight loss for some time.  We discussed different medication options to help with weight loss.  Rybelsus 3 mg samples were given in the office today.  She will try this for 30 days and let me know how she does.  If she tolerates this well we can increase to 7 mg.  She has had hyperglycemia in the past, so I feel this will be a good addition that may be able to help her achieve weight loss goals. Discussed new medication(s) today with patient. Discussed potential side effects and patient verbalized understanding.     Return for pending monday mychart update.    Theodis Shove, MD

## 2020-08-27 ENCOUNTER — Ambulatory Visit: Payer: 59 | Admitting: Family Medicine

## 2020-08-27 ENCOUNTER — Encounter: Payer: Self-pay | Admitting: Family Medicine

## 2020-08-28 ENCOUNTER — Other Ambulatory Visit: Payer: Self-pay | Admitting: *Deleted

## 2020-08-28 MED ORDER — RYBELSUS 3 MG PO TABS: 3.0000 mg | ORAL_TABLET | Freq: Every day | ORAL | 0 refills | Status: AC

## 2020-09-10 ENCOUNTER — Encounter: Payer: Self-pay | Admitting: Neurology

## 2020-09-13 ENCOUNTER — Ambulatory Visit: Payer: Self-pay | Admitting: Neurology

## 2020-10-01 ENCOUNTER — Other Ambulatory Visit (HOSPITAL_COMMUNITY): Payer: Self-pay

## 2020-10-04 ENCOUNTER — Other Ambulatory Visit (HOSPITAL_COMMUNITY): Payer: Self-pay

## 2020-10-04 ENCOUNTER — Encounter: Payer: Self-pay | Admitting: Family Medicine

## 2020-10-04 MED FILL — Topiramate Tab 50 MG: ORAL | 90 days supply | Qty: 270 | Fill #0 | Status: AC

## 2020-10-05 ENCOUNTER — Other Ambulatory Visit (HOSPITAL_COMMUNITY): Payer: Self-pay

## 2020-10-05 ENCOUNTER — Other Ambulatory Visit: Payer: Self-pay

## 2020-10-09 ENCOUNTER — Other Ambulatory Visit (HOSPITAL_COMMUNITY): Payer: Self-pay

## 2020-10-09 MED ORDER — DOXYCYCLINE MONOHYDRATE 50 MG PO TABS
ORAL_TABLET | ORAL | 5 refills | Status: DC
  Filled 2020-10-09 – 2021-01-14 (×2): qty 30, 30d supply, fill #0
  Filled 2021-03-06: qty 30, 30d supply, fill #1
  Filled 2021-04-10: qty 30, 30d supply, fill #2
  Filled 2021-08-03: qty 30, 30d supply, fill #3

## 2020-10-10 ENCOUNTER — Other Ambulatory Visit (HOSPITAL_COMMUNITY): Payer: Self-pay

## 2020-10-11 ENCOUNTER — Other Ambulatory Visit (HOSPITAL_COMMUNITY): Payer: Self-pay

## 2020-10-11 MED ORDER — RYBELSUS 7 MG PO TABS
7.0000 mg | ORAL_TABLET | Freq: Every day | ORAL | 1 refills | Status: DC
  Filled 2020-10-11: qty 90, 90d supply, fill #0

## 2021-01-14 ENCOUNTER — Other Ambulatory Visit: Payer: Self-pay | Admitting: Family Medicine

## 2021-01-14 ENCOUNTER — Other Ambulatory Visit (HOSPITAL_COMMUNITY): Payer: Self-pay

## 2021-01-14 DIAGNOSIS — G43809 Other migraine, not intractable, without status migrainosus: Secondary | ICD-10-CM

## 2021-01-15 ENCOUNTER — Other Ambulatory Visit (HOSPITAL_COMMUNITY): Payer: Self-pay

## 2021-01-15 MED ORDER — TOPIRAMATE 50 MG PO TABS
ORAL_TABLET | ORAL | 0 refills | Status: DC
  Filled 2021-01-15: qty 270, 90d supply, fill #0

## 2021-01-16 ENCOUNTER — Other Ambulatory Visit (HOSPITAL_COMMUNITY): Payer: Self-pay

## 2021-01-17 ENCOUNTER — Encounter: Payer: Self-pay | Admitting: Family Medicine

## 2021-01-17 ENCOUNTER — Other Ambulatory Visit (HOSPITAL_COMMUNITY): Payer: Self-pay

## 2021-01-17 ENCOUNTER — Telehealth (INDEPENDENT_AMBULATORY_CARE_PROVIDER_SITE_OTHER): Payer: 59 | Admitting: Family Medicine

## 2021-01-17 DIAGNOSIS — R067 Sneezing: Secondary | ICD-10-CM | POA: Diagnosis not present

## 2021-01-17 DIAGNOSIS — R051 Acute cough: Secondary | ICD-10-CM | POA: Diagnosis not present

## 2021-01-17 DIAGNOSIS — R0981 Nasal congestion: Secondary | ICD-10-CM

## 2021-01-17 MED ORDER — ALBUTEROL SULFATE HFA 108 (90 BASE) MCG/ACT IN AERS
2.0000 | INHALATION_SPRAY | Freq: Four times a day (QID) | RESPIRATORY_TRACT | 0 refills | Status: DC | PRN
  Filled 2021-01-17: qty 18, 25d supply, fill #0

## 2021-01-17 MED ORDER — BENZONATATE 200 MG PO CAPS
200.0000 mg | ORAL_CAPSULE | Freq: Two times a day (BID) | ORAL | 0 refills | Status: DC | PRN
  Filled 2021-01-17: qty 20, 10d supply, fill #0

## 2021-01-17 NOTE — Progress Notes (Signed)
Virtual Visit via Video Note  I connected with Debra Parker  on 01/17/21 at 11:40 AM EDT by a video enabled telemedicine application and verified that I am speaking with the correct person using two identifiers.  Location patient: home, [place] Location provider:work or home office Persons participating in the virtual visit: patient, provider  I discussed the limitations of evaluation and management by telemedicine and the availability of in person appointments. The patient expressed understanding and agreed to proceed.   HPI:  Acute telemedicine visit for a Cough -Onset: about 3 days ago -did a covid test 3 days ago which was negative - home test -daughter has had a cold -Symptoms include: nasal congestion, sneezing, cough, a little wheezing at time (reports has a history of "bronchitis" and is usually treated with "a zpack and prednisone") -Denies:fever, NVD, CP, SOB, inability to eat/drink/get out of bed -Has tried:cough -Pertinent past medical history: see below -Pertinent medication allergies:  Allergies  Allergen Reactions   Aimovig [Erenumab-Aooe] Hives   Sulfa Antibiotics Other (See Comments)    Not sure.  -COVID-19 vaccine status: has had 2 doses; has had flu shot this year  ROS: See pertinent positives and negatives per HPI.  Past Medical History:  Diagnosis Date   Bronchitis    Diverticulitis 2019   Hypertension    when pregnant   Migraines     Past Surgical History:  Procedure Laterality Date   CESAREAN SECTION  2007   fatty pocket removed  1999   WISDOM TOOTH EXTRACTION  2000     Current Outpatient Medications:    acetaminophen (TYLENOL) 500 MG tablet, Take 500 mg by mouth every 6 (six) hours as needed for mild pain., Disp: , Rfl:    albuterol (PROAIR HFA) 108 (90 Base) MCG/ACT inhaler, Inhale 2 puffs into the lungs every 6 (six) hours as needed for wheezing or shortness of breath., Disp: 1 each, Rfl: 0   benzonatate (TESSALON) 200 MG capsule, Take 1 capsule (200  mg total) by mouth 2 (two) times daily as needed., Disp: 20 capsule, Rfl: 0   doxycycline (ADOXA) 50 MG tablet, TAKE 1 TABLET BY MOUTH DAILY; TAKE WITH FOOD AND WATER, USE SUN PROTECTION., Disp: 30 tablet, Rfl: 5   doxycycline (ADOXA) 50 MG tablet, Take 1 tablet by mouth daily with food and water, use sun protection, Disp: 30 tablet, Rfl: 5   Semaglutide (RYBELSUS) 7 MG TABS, Take 7 mg by mouth daily., Disp: 90 tablet, Rfl: 1   SUMAtriptan (IMITREX) 100 MG tablet, TAKE 1 TABLET BY MOUTH AT ONSET OF MIGRAINE. CAN REPEAT IN 2 HOURS IF MIGRAINE STILL PRESENT. NOT TO EXCEED 2 DOSES IN 24 HOURS., Disp: 10 tablet, Rfl: 2   topiramate (TOPAMAX) 50 MG tablet, TAKE 1 TABLET BY MOUTH IN THE MORNING AND 2 TABLETS BY MOUTH AT BEDTIME, Disp: 270 tablet, Rfl: 0   valACYclovir (VALTREX) 1000 MG tablet, TAKE 2 TABLETS (2000MG ) BY MOUTH 2 TIMES DAILY FOR 1 DAY. TAKE AT ONSET OF OUTBREAK., Disp: 20 tablet, Rfl: 1   VITAMIN D PO, Take by mouth., Disp: , Rfl:    levonorgestrel (MIRENA, 52 MG,) 20 MCG/24HR IUD, 1 Intra Uterine Device (1 each total) by Intrauterine route once for 1 dose., Disp: 1 each, Rfl: 0  EXAM:  VITALS per patient if applicable:  GENERAL: alert, oriented, appears well and in no acute distress  HEENT: atraumatic, conjunttiva clear, no obvious abnormalities on inspection of external nose and ears  NECK: normal movements of the head and  neck  LUNGS: on inspection no signs of respiratory distress, breathing rate appears normal, no obvious gross SOB, gasping or wheezing  CV: no obvious cyanosis  MS: moves all visible extremities without noticeable abnormality  PSYCH/NEURO: pleasant and cooperative, no obvious depression or anxiety, speech and thought processing grossly intact  ASSESSMENT AND PLAN:  Discussed the following assessment and plan:  Acute cough  Sinus congestion  Sneezing  -we discussed possible serious and likely etiologies, options for evaluation and workup, limitations  of telemedicine visit vs in person visit, treatment, treatment risks and precautions. Pt is agreeable to treatment via telemedicine at this moment. Query viral resp illness, possible bronchitis, covid19 with false neg testing vs other. After a lengthy discussion, including likelihood of viral vs bacterial illness, precautions, treatment options and risks opted for tessalon, as needed albuterol q 6 hours and repeat covid testing.  Work/School slipped offered:  declined Advised to seek prompt in person care if worsening, new symptoms arise, or if is not improving with treatment. Discussed options for inperson care if PCP office not available.    I discussed the assessment and treatment plan with the patient. The patient was provided an opportunity to ask questions and all were answered. The patient agreed with the plan and demonstrated an understanding of the instructions.     Terressa Koyanagi, DO

## 2021-01-17 NOTE — Telephone Encounter (Signed)
Spoke with the patient and scheduled an appt with Dr Selena Batten today at 11:40am.

## 2021-01-17 NOTE — Patient Instructions (Signed)
-  I sent the medication(s) we discussed to your pharmacy: Meds ordered this encounter  Medications   benzonatate (TESSALON) 200 MG capsule    Sig: Take 1 capsule (200 mg total) by mouth 2 (two) times daily as needed.    Dispense:  20 capsule    Refill:  0   albuterol (PROAIR HFA) 108 (90 Base) MCG/ACT inhaler    Sig: Inhale 2 puffs into the lungs every 6 (six) hours as needed for wheezing or shortness of breath.    Dispense:  1 each    Refill:  0   Please do one more covid test. If positive contact a [facility] pharmacy or do a follow up virtual visit if you would like to do an antiviral and you are in the first 5 days of symptoms.  I hope you are feeling better soon!  Seek in person care promptly if your symptoms worsen, new concerns arise or you are not improving with treatment.  It was nice to meet you today. I help [facility] out with telemedicine visits on Tuesdays and Thursdays and am available for visits on those days. If you have any concerns or questions following this visit please schedule a follow up visit with your Primary Care doctor or seek care at a local urgent care clinic to avoid delays in care.

## 2021-01-18 ENCOUNTER — Other Ambulatory Visit (HOSPITAL_COMMUNITY): Payer: Self-pay

## 2021-01-31 DIAGNOSIS — G4733 Obstructive sleep apnea (adult) (pediatric): Secondary | ICD-10-CM | POA: Diagnosis not present

## 2021-02-06 ENCOUNTER — Telehealth: Payer: Self-pay | Admitting: Neurology

## 2021-02-06 NOTE — Telephone Encounter (Signed)
Pt was scheduled for Initial CPAP visit 04/25/21.  Pt was informed to bring machine and power cord.  DME: [facility] Phone: [phone] Fax: [phone] Equipment issued: Patrina Levering w/modem Pt to be scheduled between: 03/03/2021-05/01/2021

## 2021-02-27 DIAGNOSIS — G4733 Obstructive sleep apnea (adult) (pediatric): Secondary | ICD-10-CM | POA: Diagnosis not present

## 2021-03-03 DIAGNOSIS — G4733 Obstructive sleep apnea (adult) (pediatric): Secondary | ICD-10-CM | POA: Diagnosis not present

## 2021-03-06 ENCOUNTER — Other Ambulatory Visit (HOSPITAL_COMMUNITY): Payer: Self-pay

## 2021-04-02 DIAGNOSIS — G4733 Obstructive sleep apnea (adult) (pediatric): Secondary | ICD-10-CM | POA: Diagnosis not present

## 2021-04-10 ENCOUNTER — Other Ambulatory Visit: Payer: Self-pay | Admitting: Family Medicine

## 2021-04-10 ENCOUNTER — Other Ambulatory Visit (HOSPITAL_COMMUNITY): Payer: Self-pay

## 2021-04-10 DIAGNOSIS — G43809 Other migraine, not intractable, without status migrainosus: Secondary | ICD-10-CM

## 2021-04-10 MED ORDER — TOPIRAMATE 50 MG PO TABS
ORAL_TABLET | ORAL | 1 refills | Status: DC
  Filled 2021-04-10: qty 270, 90d supply, fill #0
  Filled 2021-08-03: qty 270, 90d supply, fill #1

## 2021-04-11 ENCOUNTER — Encounter: Payer: Self-pay | Admitting: Family Medicine

## 2021-04-11 ENCOUNTER — Telehealth: Payer: Self-pay | Admitting: Family Medicine

## 2021-04-11 ENCOUNTER — Other Ambulatory Visit (HOSPITAL_COMMUNITY): Payer: Self-pay

## 2021-04-11 NOTE — Telephone Encounter (Signed)
Patient calling in with respiratory symptoms: Shortness of breath, chest pain, palpitations or other red words send to Triage  Does the patient have a fever over 100, cough, congestion, sore throat, runny nose, lost of taste/smell (please list symptoms that patient has)?cough, congestion  What date did symptoms start?04-10-2020 (If over 5 days ago, pt may be scheduled for in person visit)  Have you tested for Covid in the last 5 days? No   If yes, was it positive []  OR negative [] ? If positive in the last 5 days, please schedule virtual visit now. If negative, schedule for an in person OV with the next available provider if PCP has no openings. Please also let patient know they will be tested again (follow the script below)  "you will have to arrive prior to your appt time to be Covid tested. Please park in back of office at the cone & call [phone] to let the staff know you have arrived. A staff member will meet you at your car to do a rapid covid test. Once the test has resulted you will be notified by phone of your results to determine if appt will remain an in person visit or be converted to a virtual/phone visit. If you arrive less than before your appt time, your visit will be automatically converted to virtual & any recommended testing will happen AFTER the visit." Pt has virtual with dr banks on 04-12-2021 at 330pm  THINGS TO REMEMBER  If no availability for virtual visit in office,  please schedule another [place] office  If no availability at another [facility] office, please instruct patient that they can schedule an evisit or virtual visit through their mychart account. Visits up to 8pm  patients can be seen in office 5 days after positive COVID test

## 2021-04-12 ENCOUNTER — Encounter: Payer: Self-pay | Admitting: Family Medicine

## 2021-04-12 ENCOUNTER — Telehealth: Payer: 59 | Admitting: Family Medicine

## 2021-04-12 ENCOUNTER — Other Ambulatory Visit: Payer: Self-pay

## 2021-04-12 DIAGNOSIS — J069 Acute upper respiratory infection, unspecified: Secondary | ICD-10-CM

## 2021-04-12 NOTE — Progress Notes (Signed)
Virtual Visit via Video Note  I connected with Debra Parker on 04/12/21 at  3:30 PM EST by a video enabled telemedicine application 2/2 COVID-19 pandemic and verified that I am speaking with the correct person using two identifiers.  Location patient: home Location provider:work or home office Persons participating in the virtual visit: patient, provider  I discussed the limitations of evaluation and management by telemedicine and the availability of in person appointments. The patient expressed understanding and agreed to proceed. Chief Complaint  Patient presents with   Cough    Cough and congestion, for 2 days. Cough is productive, has not taken anything for symptoms    HPI: Pt is a 36 year old female with past medical history significant for migraines, obesity, OSA who is followed by Dr. Eunice Blase and seen for acute concern.  Patient endorses deep productive cough, congestion, sore throat, rhinorrhea, mild HA x 3 days. SOB with exertion. Denies wheezing, diarrhea, fever, ear pain/pressure. Endorses history of chronic bronchitis. Negative COVID test.  Possible sick contacts include coworkers. Has albuterol inhaler, used once. Has not tried anything else for symptoms. Patient is a non-smoker  ROS: See pertinent positives and negatives per HPI.  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 Family History  Problem Relation Age of Onset   Diabetes Father    Hyperlipidemia Father    Early death Sister        stillborn   Heart disease Brother    Heart attack Brother 26   Arthritis Maternal Grandmother    Diabetes Maternal Grandmother    Heart attack Maternal Grandmother    Throat cancer Maternal Grandfather    Diabetes Paternal  Grandfather    Stroke Paternal Grandfather     EXAM:  VITALS per patient if applicable: RR between 12-20 bpm  GENERAL: alert, oriented, appears well and in no acute distress  HEENT: atraumatic, conjunctiva clear, no obvious abnormalities on inspection of external nose and ears  NECK: normal movements of the head and neck  LUNGS: on inspection no signs of respiratory distress, breathing rate appears normal, no obvious gross SOB, gasping or wheezing  CV: no obvious cyanosis  MS: moves all visible extremities without noticeable abnormality  PSYCH/NEURO: pleasant and cooperative, no obvious depression or anxiety, speech and thought processing grossly intact  ASSESSMENT AND PLAN:  Discussed the following assessment and plan:  Viral URI with cough -Possible causes include acute nasal pharyngitis, COVID-19 virus, influenza, RSV -Supportive care including OTC cough/cold medications, Flonase, saline nasal rinse, gargling with warm salt water Chloraseptic spray, steam from the shower, etc. -Encouraged to retest for COVID in the next few days given exposure.  Also consider RSV testing -Encouraged to use albuterol inhaler -Advised antibiotics not indicated at this time given likely viral etiology. -Given strict precautions  Follow-up with PCP for continued or worsening symptoms.  I discussed the assessment and treatment plan with the patient. The patient was provided an opportunity to ask questions and all were answered. The patient agreed with the plan and demonstrated an understanding of the instructions.   The patient was advised to call back or seek an in-person evaluation if the symptoms worsen or if the condition fails  to improve as anticipated.  Debra Saint, MD

## 2021-04-22 ENCOUNTER — Encounter: Payer: Self-pay | Admitting: Adult Health

## 2021-04-22 ENCOUNTER — Ambulatory Visit: Payer: 59 | Admitting: Adult Health

## 2021-04-22 VITALS — BP 140/88 | HR 71 | Ht 66.0 in | Wt 280.0 lb

## 2021-04-22 DIAGNOSIS — G4733 Obstructive sleep apnea (adult) (pediatric): Secondary | ICD-10-CM

## 2021-04-22 DIAGNOSIS — Z9989 Dependence on other enabling machines and devices: Secondary | ICD-10-CM | POA: Diagnosis not present

## 2021-04-22 NOTE — Progress Notes (Signed)
[facility] [address] [place]. [place] [address] [phone]       OFFICE FOLLOW UP NOTE  Debra Parker Date of Birth:  05-Aug-1985 Medical Record Number:  [id number]   Reason for visit: Initial CPAP follow-up    SUBJECTIVE:   CHIEF COMPLAINT:  Chief Complaint  Patient presents with   Obstructive Sleep Apnea    RM 3 alone Pt is well, no concerns with CPAP     HPI:   Update 04/22/2021 JM: Returns for initial CPAP compliance visit.  Initially seen by Dr. Frances Furbish 03/21/2020 with complaints of worsening migraine headaches and concern of underlying sleep apnea.  Completed sleep study through 05/18/2020 which showed mild to moderate OSA with total AHI 8.5/h, REM AHI 23/h, supine AHI 13.3/h and O2 nadir of 77%.  She initiated CPAP on 01/31/2021. Reports tolerating CPAP well and notes some improvement of migraine headaches. Unable to use last week due to bronchitis.  Epworth Sleepiness Scale 2.  Fatigue severity scale 24.   Compliance report from 03/23/2021 -04/22/2021 shows 22 out of 31 usage days for 71% compliance and 16 out of 31 usage days greater than 4 hours for 51.6% compliance.  Average usage 3 hours and 28 minutes.  Residual AHI 0.1 on min pressure 6 and max pressure 12.  Mean pressure 6.3 and average leak 0.8.       Initial consult visit Dr. Frances Furbish 03/21/2020 Ms. Debra Parker is a 36 year old right-handed woman with an underlying medical history of hypertension, diverticulitis, bronchitis, migraine headaches, and severe obesity with a BMI of over 40, who reports snoring and worsening migraine headaches, occasional daytime tiredness and lack of energy, also morning headaches.  I reviewed your office note from 02/17/2020.  She has been on Topamax for her migraines and recently Aimovig was prescribed.  She has been on phentermine for weight loss. Her Epworth sleepiness score is 3 out of 24, fatigue severity score is 23 out of 63.  She works as an Public house manager at [employer].  She lives with her husband, she has 1 biological child and 3 stepchildren.  She has 1 cat in the household.  She does not watch TV in the bedroom but they do have a TV on sometimes in the bedroom.  She has never had a sleep study.  She would be willing to pursue a sleep study.  She goes to bed between 10 and 10:30 PM and rise time is around 5 AM.  She does not have night to night nocturia.  She recently tried the Aimovig but had a injection site reaction and did not have a second shot after that.  He does not drink caffeine, she is a non-smoker and does not utilize alcohol.       ROS:   14 system review of systems performed and negative with exception of those listed in HPI  PMH:  Past Medical History:  Diagnosis Date   Bronchitis    Diverticulitis 2019   Hypertension    when pregnant   Migraines     PSH:  Past Surgical History:  Procedure Laterality Date   CESAREAN SECTION  2007   fatty pocket removed  1999   WISDOM TOOTH EXTRACTION  2000    Social History:  Social History   Socioeconomic History   Marital status: Married    Spouse name: Not on file   Number of children: Not on file   Years of education: Not on file   Highest education  level: Not on file  Occupational History   Not on file  Tobacco Use   Smoking status: Never   Smokeless tobacco: Never  Substance and Sexual Activity   Alcohol use: No   Drug use: No   Sexual activity: Yes    Birth control/protection: I.U.D.  Other Topics Concern   Not on file  Social History Narrative   Not on file   Social Determinants of Health   Financial Resource Strain: Not on file  Food Insecurity: Not on file  Transportation Needs: Not on file  Physical Activity: Not on file  Stress: Not on file  Social Connections: Not on file  Intimate Partner Violence: Not on file    Family History:  Family History  Problem Relation Age of Onset   Diabetes Father    Hyperlipidemia Father    Early death  Sister        stillborn   Heart disease Brother    Heart attack Brother [phone]   Arthritis Maternal Grandmother    Diabetes Maternal Grandmother    Heart attack Maternal Grandmother    Throat cancer Maternal Grandfather    Diabetes Paternal Grandfather    Stroke Paternal Grandfather     Medications:   Current Outpatient Medications on File Prior to Visit  Medication Sig Dispense Refill   acetaminophen (TYLENOL) 500 MG tablet Take 500 mg by mouth every 6 (six) hours as needed for mild pain.     albuterol (PROAIR HFA) 108 (90 Base) MCG/ACT inhaler Inhale 2 puffs into the lungs every 6 (six) hours as needed for wheezing or shortness of breath. 18 g 0   doxycycline (ADOXA) 50 MG tablet Take 1 tablet by mouth daily with food and water, use sun protection 30 tablet 5   levonorgestrel (MIRENA, 52 MG,) 20 MCG/24HR IUD 1 Intra Uterine Device (1 each total) by Intrauterine route once for 1 dose. 1 each 0   SUMAtriptan (IMITREX) 100 MG tablet TAKE 1 TABLET BY MOUTH AT ONSET OF MIGRAINE. CAN REPEAT IN 2 HOURS IF MIGRAINE STILL PRESENT. NOT TO EXCEED 2 DOSES IN 24 HOURS. 10 tablet 2   topiramate (TOPAMAX) 50 MG tablet TAKE 1 TABLET BY MOUTH IN THE MORNING AND 2 TABLETS BY MOUTH AT BEDTIME (Patient taking differently: 150 mg. PT is taking 3 tab in morning and 3 at bed time.) 270 tablet 1   valACYclovir (VALTREX) 1000 MG tablet TAKE 2 TABLETS (2000MG ) BY MOUTH 2 TIMES DAILY FOR 1 DAY. TAKE AT ONSET OF OUTBREAK. 20 tablet 1   VITAMIN D PO Take by mouth.     No current facility-administered medications on file prior to visit.    Allergies:   Allergies  Allergen Reactions   Aimovig [Erenumab-Aooe] Hives   Sulfa Antibiotics Other (See Comments)    Not sure.      OBJECTIVE:  Physical Exam  Vitals:   04/22/21 1236  BP: 140/88  Pulse: 71  Weight: 280 lb (127 kg)  Height: 5\' 6"  (1.676 m)   Body mass index is 45.19 kg/m. No results found.  General: Morbidly obese very pleasant middle-age  Caucasian female, seated, in no evident distress Head: head normocephalic and atraumatic.   Neck: supple with no carotid or supraclavicular bruits Cardiovascular: regular rate and rhythm, no murmurs Musculoskeletal: no deformity Skin:  no rash/petichiae Vascular:  Normal pulses all extremities   Neurologic Exam Mental Status: Awake and fully alert. Oriented to place and time. Recent and remote memory intact. Attention span, concentration and  fund of knowledge appropriate. Mood and affect appropriate.  Cranial Nerves: Pupils equal, briskly reactive to light. Extraocular movements full without nystagmus. Visual fields full to confrontation. Hearing intact. Facial sensation intact. Face, tongue, palate moves normally and symmetrically.  Motor: Normal bulk and tone. Normal strength in all tested extremity muscles Sensory.: intact to touch , pinprick , position and vibratory sensation.  Coordination: Rapid alternating movements normal in all extremities. Finger-to-nose and heel-to-shin performed accurately bilaterally. Gait and Station: Arises from chair without difficulty. Stance is normal. Gait demonstrates normal stride length and balance without use of AD.  Reflexes: 1+ and symmetric. Toes downgoing.         ASSESSMENT: Debra Parker is a 36 y.o. year old female followed by Dr. Frances Furbish for mild to moderate sleep apnea. Being seen today for initial CPAP compliance visit      PLAN:  OSA on CPAP : Suboptimal compliance although residual AHI 0.1.  Discussed importance of increasing nightly compliance and ensuring greater than 4 hours per night for full benefit.  Continue to follow with DME company for any needed supplies or CPAP related concerns.  Continue current pressure settings.    Follow up in 6 months or call earlier if needed   CC:  PCP: Wynn Banker, MD    I spent 24 minutes of face-to-face and non-face-to-face time with patient.  This included previsit chart  review, lab review, study review, order entry, electronic health record documentation, patient education and discussion regarding diagnosis of sleep apnea and use of CPAP, review and discussion of CPAP compliance report, importance of increasing nightly usage and answered all other questions to patient satisfaction   Ihor Austin, AGNP-BC  [facility] [address] [place], [place] [address]  Phone [phone] Fax [phone] Note: This document was prepared with digital dictation and possible smart phrase technology. Any transcriptional errors that result from this process are unintentional.

## 2021-04-25 ENCOUNTER — Ambulatory Visit: Payer: 59 | Admitting: Neurology

## 2021-05-02 DIAGNOSIS — G4733 Obstructive sleep apnea (adult) (pediatric): Secondary | ICD-10-CM | POA: Diagnosis not present

## 2021-05-03 DIAGNOSIS — G4733 Obstructive sleep apnea (adult) (pediatric): Secondary | ICD-10-CM | POA: Diagnosis not present

## 2021-06-05 ENCOUNTER — Other Ambulatory Visit: Payer: Self-pay

## 2021-06-05 ENCOUNTER — Ambulatory Visit
Admission: EM | Admit: 2021-06-05 | Discharge: 2021-06-05 | Disposition: A | Discharge status: Home / Self Care | Payer: 59 | Attending: Family Medicine | Admitting: Family Medicine

## 2021-06-05 ENCOUNTER — Encounter: Payer: Self-pay | Admitting: Emergency Medicine

## 2021-06-05 DIAGNOSIS — W57XXXA Bitten or stung by nonvenomous insect and other nonvenomous arthropods, initial encounter: Secondary | ICD-10-CM | POA: Diagnosis not present

## 2021-06-05 DIAGNOSIS — S80869A Insect bite (nonvenomous), unspecified lower leg, initial encounter: Secondary | ICD-10-CM

## 2021-06-05 MED ORDER — CLOBETASOL PROPIONATE 0.05 % EX OINT: 1.0000 "application " | TOPICAL_OINTMENT | Freq: Two times a day (BID) | CUTANEOUS | 0 refills | Status: DC | PRN

## 2021-06-05 NOTE — ED Triage Notes (Signed)
Bites on lower right leg since Sunday.  States area itches.  Areas are red and bruised ?

## 2021-06-05 NOTE — ED Provider Notes (Signed)
?[facility] ? ? ? ?CSN: [id number] ?Arrival date & time: 06/05/21  1749 ? ? ?  ? ?History   ?Chief Complaint ?No chief complaint on file. ? ? ?HPI ?Debra Parker is a 36 y.o. female.  ? ?Presenting today with itchy raised insect bites to bilateral ankles and lower legs that occurred 4 days ago while she was outside painting.  Denies drainage, fevers, streaking, numbness, tingling, weakness.  Tried some calamine spray but states it burned so she stopped using it and has not tried anything else for symptoms.  States she typically does have welts when she gets insect bites. ? ? ?Past Medical History:  ?Diagnosis Date  ? Bronchitis   ? Diverticulitis 2019  ? Hypertension   ? when pregnant  ? Migraines   ? ? ?Patient Active Problem List  ? Diagnosis Date Noted  ? Lichen planopilaris 02/19/2020  ? Migraine 02/17/2020  ? Morbid obesity (HCC) 02/01/2018  ? ? ?Past Surgical History:  ?Procedure Laterality Date  ? CESAREAN SECTION  2007  ? fatty pocket removed  1999  ? WISDOM TOOTH EXTRACTION  2000  ? ? ?OB History   ?No obstetric history on file. ?  ? ? ? ?Home Medications   ? ?Prior to Admission medications   ?Medication Sig Start Date End Date Taking? Authorizing Provider  ?clobetasol ointment (TEMOVATE) 0.05 % Apply 1 application topically 2 (two) times daily as needed. 06/05/21  Yes Particia Nearing, PA-C  ?acetaminophen (TYLENOL) 500 MG tablet Take 500 mg by mouth every 6 (six) hours as needed for mild pain.    [provider]  ?albuterol (PROAIR HFA) 108 (90 Base) MCG/ACT inhaler Inhale 2 puffs into the lungs every 6 (six) hours as needed for wheezing or shortness of breath. 01/17/21   Terressa Koyanagi, DO  ?doxycycline (ADOXA) 50 MG tablet Take 1 tablet by mouth daily with food and water, use sun protection 10/09/20     ?levonorgestrel (MIRENA, 52 MG,) 20 MCG/24HR IUD 1 Intra Uterine Device (1 each total) by Intrauterine route once for 1 dose. 11/10/18 04/22/21  Wynn Banker, MD   ?SUMAtriptan (IMITREX) 100 MG tablet TAKE 1 TABLET BY MOUTH AT ONSET OF MIGRAINE. CAN REPEAT IN 2 HOURS IF MIGRAINE STILL PRESENT. NOT TO EXCEED 2 DOSES IN 24 HOURS. 04/02/20 04/22/21  Wynn Banker, MD  ?topiramate (TOPAMAX) 50 MG tablet TAKE 1 TABLET BY MOUTH IN THE MORNING AND 2 TABLETS BY MOUTH AT BEDTIME ?Patient taking differently: 150 mg. PT is taking 3 tab in morning and 3 at bed time. 04/10/21 04/10/22  Wynn Banker, MD  ?valACYclovir (VALTREX) 1000 MG tablet TAKE 2 TABLETS (2000MG ) BY MOUTH 2 TIMES DAILY FOR 1 DAY. TAKE AT ONSET OF OUTBREAK. 06/27/20 06/27/21  06/29/21, MD  ?VITAMIN D PO Take by mouth.    [provider]  ? ? ?Family History ?Family History  ?Problem Relation Age of Onset  ? Diabetes Father   ? Hyperlipidemia Father   ? Early death Sister   ?     stillborn  ? Heart disease Brother   ? Heart attack Brother 40  ? Arthritis Maternal Grandmother   ? Diabetes Maternal Grandmother   ? Heart attack Maternal Grandmother   ? Throat cancer Maternal Grandfather   ? Diabetes Paternal Grandfather   ? Stroke Paternal Grandfather   ? ? ?Social History ?Social History  ? ?Tobacco Use  ? Smoking status: Never  ? Smokeless tobacco: Never  ?  Substance Use Topics  ? Alcohol use: No  ? Drug use: No  ? ? ? ?Allergies   ?Aimovig [erenumab-aooe] and Sulfa antibiotics ? ? ?Review of Systems ?Review of Systems ?Per HPI ? ?Physical Exam ?Triage Vital Signs ?ED Triage Vitals  ?Enc Vitals Group  ?   BP 06/05/21 1754 112/75  ?   Pulse Rate 06/05/21 1754 79  ?   Resp 06/05/21 1754 18  ?   Temp 06/05/21 1754 98 ?F (36.7 ?C)  ?   Temp Source 06/05/21 1754 Oral  ?   SpO2 06/05/21 1754 98 %  ?   Weight --   ?   Height --   ?   Head Circumference --   ?   Peak Flow --   ?   Pain Score 06/05/21 1755 1  ?   Pain Loc --   ?   Pain Edu? --   ?   Excl. in GC? --   ? ?No data found. ? ?Updated Vital Signs ?BP 112/75 (BP Location: Right Arm)   Pulse 79   Temp 98 ?F (36.7 ?C) (Oral)   Resp 18   SpO2 98%   ? ?Visual Acuity ?Right Eye Distance:   ?Left Eye Distance:   ?Bilateral Distance:   ? ?Right Eye Near:   ?Left Eye Near:    ?Bilateral Near:    ? ?Physical Exam ?Vitals and nursing note reviewed.  ?Constitutional:   ?   Appearance: Normal appearance. She is not ill-appearing.  ?HENT:  ?   Head: Atraumatic.  ?Eyes:  ?   Extraocular Movements: Extraocular movements intact.  ?   Conjunctiva/sclera: Conjunctivae normal.  ?Cardiovascular:  ?   Rate and Rhythm: Normal rate and regular rhythm.  ?   Heart sounds: Normal heart sounds.  ?Pulmonary:  ?   Effort: Pulmonary effort is normal.  ?   Breath sounds: Normal breath sounds.  ?Musculoskeletal:     ?   General: Normal range of motion.  ?   Cervical back: Normal range of motion and neck supple.  ?Skin: ?   General: Skin is warm and dry.  ?   Comments: Erythematous insect bite areas, slightly raised to the lower legs bilaterally just above ankles.  No drainage, bleeding, ulceration  ?Neurological:  ?   Mental Status: She is alert and oriented to person, place, and time.  ?Psychiatric:     ?   Mood and Affect: Mood normal.     ?   Thought Content: Thought content normal.     ?   Judgment: Judgment normal.  ? ? ? ?UC Treatments / Results  ?Labs ?(all labs ordered are listed, but only abnormal results are displayed) ?Labs Reviewed - No data to display ? ?EKG ? ? ?Radiology ?No results found. ? ?Procedures ?Procedures (including critical care time) ? ?Medications Ordered in UC ?Medications - No data to display ? ?Initial Impression / Assessment and Plan / UC Course  ?I have reviewed the triage vital signs and the nursing notes. ? ?Pertinent labs & imaging results that were available during my care of the patient were reviewed by me and considered in my medical decision making (see chart for details). ? ?  ? ?No obvious cellulitis, suspect inflammatory reaction.  Treat with clobetasol, moisturization, close monitoring.  Return precautions reviewed. ? ?Final Clinical  Impressions(s) / UC Diagnoses  ? ?Final diagnoses:  ?Insect bite of lower leg, unspecified laterality, initial encounter  ? ?Discharge Instructions   ?None ?  ? ?  ED Prescriptions   ? ? Medication Sig Dispense Auth. Provider  ? clobetasol ointment (TEMOVATE) 0.05 % Apply 1 application topically 2 (two) times daily as needed. 60 g Particia Nearing, [place]  ? ?  ? ?PDMP not reviewed this encounter. ?  ?Particia Nearing, PA-C ?06/05/21 1831 ? ?

## 2021-08-02 DIAGNOSIS — G4733 Obstructive sleep apnea (adult) (pediatric): Secondary | ICD-10-CM | POA: Diagnosis not present

## 2021-08-03 ENCOUNTER — Other Ambulatory Visit (HOSPITAL_COMMUNITY): Payer: Self-pay

## 2021-10-17 ENCOUNTER — Telehealth: Payer: Self-pay

## 2021-10-17 NOTE — Telephone Encounter (Signed)
Contacted pt, reminded her to bring CPAP to appt Monday. Pt verbally understood.

## 2021-10-21 ENCOUNTER — Ambulatory Visit: Payer: 59 | Admitting: Adult Health

## 2021-10-21 ENCOUNTER — Encounter: Payer: Self-pay | Admitting: Adult Health

## 2021-10-21 VITALS — BP 133/82 | HR 72 | Ht 66.0 in | Wt 285.0 lb

## 2021-10-21 DIAGNOSIS — G4733 Obstructive sleep apnea (adult) (pediatric): Secondary | ICD-10-CM | POA: Diagnosis not present

## 2021-10-21 DIAGNOSIS — Z9989 Dependence on other enabling machines and devices: Secondary | ICD-10-CM

## 2021-10-21 NOTE — Patient Instructions (Signed)
Please further discuss CPAP concerns with your DME company - if any assistance is needed from our end, please let me know  Please restart use of CPAP once able ensuring nightly use with greater than 4 hours nightly although use for full duration of sleeping is highly encouraged    Follow-up in 6 months or call earlier if needed

## 2021-10-21 NOTE — Progress Notes (Signed)
[facility] [address] [place]. [place] [address] [phone]       OFFICE FOLLOW UP NOTE  Debra Parker Date of Birth:  [birth year] Medical Record Number:  [id number]    Primary neurologist: Dr. Frances Furbish Reason for visit: CPAP follow-up    SUBJECTIVE:   CHIEF COMPLAINT:  Chief Complaint  Patient presents with   Obstructive Sleep Apnea    Rm 3 alone Pt is well, having some concerns with the air flow in CPAP. Daughter was recently sick and pt wasn't able to use CPAP     HPI:   Update 10/21/2021 JM: Patient returns for 60-month CPAP follow-up accompanied.  Compliance report shows poor compliance from the last 30 days (0%) and 90 days (20%) as her 8 year old daughter was found to have an extensive blood clot requiring treatment at [facility] and shortly after, her mother had a stroke. She attempted to restart last month but after she places her mask on, she reports no air is coming out. She tried to use a couple times with same issue.  She has not yet attempted her DME company. Prior to all this, she reports tolerating CPAP well including current pressure settings and mask.  Epworth Sleepiness Scale 1.  Fatigue severity scale 12.  No further concerns at this time     History provided for reference purposes only Update 04/22/2021 JM: Returns for initial CPAP compliance visit.  Initially seen by Dr. Frances Furbish 03/21/2020 with complaints of worsening migraine headaches and concern of underlying sleep apnea.  Completed sleep study through 05/18/2020 which showed mild to moderate OSA with total AHI 8.5/h, REM AHI 23/h, supine AHI 13.3/h and O2 nadir of 77%.  She initiated CPAP on 01/31/2021.   Reports tolerating CPAP well and notes some improvement of migraine headaches. Unable to use last week due to bronchitis.  Epworth Sleepiness Scale 2.  Fatigue severity scale 24.   Compliance report from 03/23/2021 -04/22/2021 shows 22 out of 31 usage days for 71%  compliance and 16 out of 31 usage days greater than 4 hours for 51.6% compliance.  Average usage 3 hours and 28 minutes.  Residual AHI 0.1 on min pressure 6 and max pressure 12.  Mean pressure 6.3 and average leak 0.8.    Initial consult visit Dr. Frances Furbish 03/21/2020 Debra Parker is a 36 year old right-handed woman with an underlying medical history of hypertension, diverticulitis, bronchitis, migraine headaches, and severe obesity with a BMI of over 40, who reports snoring and worsening migraine headaches, occasional daytime tiredness and lack of energy, also morning headaches.  I reviewed your office note from 02/17/2020.  She has been on Topamax for her migraines and recently Aimovig was prescribed.  She has been on phentermine for weight loss. Her Epworth sleepiness score is 3 out of 24, fatigue severity score is 23 out of 63.  She works as an Public house manager at [employer].  She lives with her husband, she has 1 biological child and 3 stepchildren.  She has 1 cat in the household.  She does not watch TV in the bedroom but they do have a TV on sometimes in the bedroom.  She has never had a sleep study.  She would be willing to pursue a sleep study.  She goes to bed between 10 and 10:30 PM and rise time is around 5 AM.  She does not have night to night nocturia.  She recently tried the Aimovig but had a injection site reaction and  did not have a second shot after that.  He does not drink caffeine, she is a non-smoker and does not utilize alcohol.       ROS:   14 system review of systems performed and negative with exception of those listed in HPI  PMH:  Past Medical History:  Diagnosis Date   Bronchitis    Diverticulitis 2019   Hypertension    when pregnant   Migraines     PSH:  Past Surgical History:  Procedure Laterality Date   CESAREAN SECTION  2007   fatty pocket removed  1999   WISDOM TOOTH EXTRACTION  2000    Social History:  Social History   Socioeconomic History   Marital  status: Married    Spouse name: Not on file   Number of children: Not on file   Years of education: Not on file   Highest education level: Not on file  Occupational History   Not on file  Tobacco Use   Smoking status: Never   Smokeless tobacco: Never  Substance and Sexual Activity   Alcohol use: No   Drug use: No   Sexual activity: Yes    Birth control/protection: I.U.D.  Other Topics Concern   Not on file  Social History Narrative   Not on file   Social Determinants of Health   Financial Resource Strain: Not on file  Food Insecurity: Not on file  Transportation Needs: Not on file  Physical Activity: Not on file  Stress: Not on file  Social Connections: Not on file  Intimate Partner Violence: Not on file    Family History:  Family History  Problem Relation Age of Onset   Diabetes Father    Hyperlipidemia Father    Early death Sister        stillborn   Heart disease Brother    Heart attack Brother 28   Arthritis Maternal Grandmother    Diabetes Maternal Grandmother    Heart attack Maternal Grandmother    Throat cancer Maternal Grandfather    Diabetes Paternal Grandfather    Stroke Paternal Grandfather     Medications:   Current Outpatient Medications on File Prior to Visit  Medication Sig Dispense Refill   acetaminophen (TYLENOL) 500 MG tablet Take 500 mg by mouth every 6 (six) hours as needed for mild pain.     albuterol (PROAIR HFA) 108 (90 Base) MCG/ACT inhaler Inhale 2 puffs into the lungs every 6 (six) hours as needed for wheezing or shortness of breath. 18 g 0   doxycycline (ADOXA) 50 MG tablet Take 1 tablet by mouth daily with food and water, use sun protection 30 tablet 5   levonorgestrel (MIRENA, 52 MG,) 20 MCG/24HR IUD 1 Intra Uterine Device (1 each total) by Intrauterine route once for 1 dose. 1 each 0   SUMAtriptan (IMITREX) 100 MG tablet TAKE 1 TABLET BY MOUTH AT ONSET OF MIGRAINE. CAN REPEAT IN 2 HOURS IF MIGRAINE STILL PRESENT. NOT TO EXCEED 2  DOSES IN 24 HOURS. 10 tablet 2   topiramate (TOPAMAX) 50 MG tablet TAKE 1 TABLET BY MOUTH IN THE MORNING AND 2 TABLETS BY MOUTH AT BEDTIME (Patient taking differently: 150 mg. PT is taking 3 tab in morning and 3 at bed time.) 270 tablet 1   VITAMIN D PO Take by mouth.     No current facility-administered medications on file prior to visit.    Allergies:   Allergies  Allergen Reactions   Aimovig [Erenumab-Aooe] Hives   Sulfa  Antibiotics Other (See Comments)    Not sure.      OBJECTIVE:  Physical Exam  Vitals:   10/21/21 0735  BP: 133/82  Pulse: 72  Weight: 285 lb (129.3 kg)  Height: 5\' 6"  (1.676 m)    Body mass index is 46 kg/m. No results found.  General: Morbidly obese very pleasant middle-age Caucasian female, seated, in no evident distress Head: head normocephalic and atraumatic.   Neck: supple with no carotid or supraclavicular bruits Cardiovascular: regular rate and rhythm, no murmurs Musculoskeletal: no deformity Skin:  no rash/petichiae Vascular:  Normal pulses all extremities   Neurologic Exam Mental Status: Awake and fully alert. Oriented to place and time. Recent and remote memory intact. Attention span, concentration and fund of knowledge appropriate. Mood and affect appropriate.  Cranial Nerves: Pupils equal, briskly reactive to light. Extraocular movements full without nystagmus. Visual fields full to confrontation. Hearing intact. Facial sensation intact. Face, tongue, palate moves normally and symmetrically.  Motor: Normal bulk and tone. Normal strength in all tested extremity muscles Sensory.: intact to touch , pinprick , position and vibratory sensation.  Coordination: Rapid alternating movements normal in all extremities. Finger-to-nose and heel-to-shin performed accurately bilaterally. Gait and Station: Arises from chair without difficulty. Stance is normal. Gait demonstrates normal stride length and balance without use of AD.  Reflexes: 1+ and  symmetric. Toes downgoing.         ASSESSMENT: Debra Parker is a 36 y.o. year old female followed by Dr. 20 for mild to moderate sleep apnea. Being seen today for CPAP compliance visit      PLAN:  OSA on CPAP : poor compliance due to stressors with family. After trying to restart, issues with air coming from machine. Advised to contact DME for further investigation. Advised to call if she needs any further assistance.  Discussed importance of nightly CPAP usage ensuring greater than 4 hours nightly for optimal benefit and for insurance purposes although use during full duration of sleep is encouraged. Continue to follow with DME company for any needed supplies or CPAP related concerns.  Continue current pressure settings.    Follow up in 6 months or call earlier if needed   CC:  PCP: Frances Furbish, MD (Inactive)    I spent 22 minutes of face-to-face and non-face-to-face time with patient.  This included previsit chart review, lab review, study review, order entry, electronic health record documentation, patient education and discussion regarding sleep apnea and use of CPAP, review and discussion of CPAP compliance report and recent difficulty and answered all other questions to patient satisfaction   Wynn Banker, AGNP-BC  [facility] [address] [place], [place]  Phone [phone] Fax [phone] Note: This document was prepared with digital dictation and possible smart phrase technology. Any transcriptional errors that result from this process are unintentional.

## 2021-10-30 DIAGNOSIS — G4733 Obstructive sleep apnea (adult) (pediatric): Secondary | ICD-10-CM | POA: Diagnosis not present

## 2021-11-26 ENCOUNTER — Ambulatory Visit: Payer: 59 | Admitting: Family Medicine

## 2021-11-28 ENCOUNTER — Ambulatory Visit: Payer: 59 | Admitting: Family Medicine

## 2021-12-03 ENCOUNTER — Encounter: Payer: 59 | Admitting: Family Medicine

## 2021-12-06 ENCOUNTER — Other Ambulatory Visit: Payer: Self-pay

## 2021-12-06 ENCOUNTER — Ambulatory Visit
Admission: EM | Admit: 2021-12-06 | Discharge: 2021-12-06 | Disposition: A | Discharge status: Home / Self Care | Payer: 59 | Attending: Family Medicine | Admitting: Family Medicine

## 2021-12-06 ENCOUNTER — Encounter: Payer: Self-pay | Admitting: Emergency Medicine

## 2021-12-06 DIAGNOSIS — Z20822 Contact with and (suspected) exposure to covid-19: Secondary | ICD-10-CM | POA: Diagnosis not present

## 2021-12-06 DIAGNOSIS — J069 Acute upper respiratory infection, unspecified: Secondary | ICD-10-CM | POA: Diagnosis not present

## 2021-12-06 DIAGNOSIS — R051 Acute cough: Secondary | ICD-10-CM | POA: Diagnosis not present

## 2021-12-06 LAB — RESP PANEL BY RT-PCR (FLU A&B, COVID) ARPGX2
Influenza A by PCR: POSITIVE — AB
Influenza B by PCR: NEGATIVE
SARS Coronavirus 2 by RT PCR: NEGATIVE

## 2021-12-06 MED ORDER — MOLNUPIRAVIR EUA 200MG CAPSULE: 4.0000 | ORAL_CAPSULE | Freq: Two times a day (BID) | ORAL | 0 refills | Status: AC

## 2021-12-06 MED ORDER — ALBUTEROL SULFATE HFA 108 (90 BASE) MCG/ACT IN AERS: 1.0000 | INHALATION_SPRAY | Freq: Four times a day (QID) | RESPIRATORY_TRACT | 0 refills | Status: DC | PRN

## 2021-12-06 MED ORDER — PROMETHAZINE-DM 6.25-15 MG/5ML PO SYRP: 5.0000 mL | ORAL_SOLUTION | Freq: Four times a day (QID) | ORAL | 0 refills | Status: DC | PRN

## 2021-12-06 NOTE — ED Triage Notes (Signed)
Pt reports cough, headache, nausea, fatigue x1 week. Pt reports returned from cruise on Monday and went to beach following day. Pt reports spouse tested positive for covid on Tuesday and states pt has had home covid test x3 with negative result. Pt also reports history of bronchitis.

## 2021-12-06 NOTE — ED Provider Notes (Signed)
[facility]    CSN: [id number] Arrival date & time: 12/06/21  1734      History   Chief Complaint Chief Complaint  Patient presents with   Cough    HPI Debra Parker is a 36 y.o. female.   Patient presenting today with several day history of cough, congestion, headache, nausea, fatigue, chest tightness, fever, chills, body aches.  Denies chest pain, shortness of breath, abdominal pain, diarrhea.  States she just got back from a cruise, has been tested positive several days ago for COVID.  She states she has tested 3 times with a home test and all have been negative.  Does have a history of chronic bronchitis, not currently with any albuterol inhalers at home.    Past Medical History:  Diagnosis Date   Bronchitis    Diverticulitis 2019   Hypertension    when pregnant   Migraines     Patient Active Problem List   Diagnosis Date Noted   Lichen planopilaris 02/19/2020   Migraine 02/17/2020   Morbid obesity (HCC) 02/01/2018    Past Surgical History:  Procedure Laterality Date   CESAREAN SECTION  2007   fatty pocket removed  1999   WISDOM TOOTH EXTRACTION  2000    OB History   No obstetric history on file.      Home Medications    Prior to Admission medications   Medication Sig Start Date End Date Taking? Authorizing Provider  albuterol (VENTOLIN HFA) 108 (90 Base) MCG/ACT inhaler Inhale 1-2 puffs into the lungs every 6 (six) hours as needed for wheezing or shortness of breath. 12/06/21  Yes Particia Nearing, PA-C  molnupiravir EUA (LAGEVRIO) 200 mg CAPS capsule Take 4 capsules (800 mg total) by mouth 2 (two) times daily for 5 days. 12/06/21 12/11/21 Yes Particia Nearing, PA-C  promethazine-dextromethorphan (PROMETHAZINE-DM) 6.25-15 MG/5ML syrup Take 5 mLs by mouth 4 (four) times daily as needed. 12/06/21  Yes Particia Nearing, PA-C  acetaminophen (TYLENOL) 500 MG tablet Take 500 mg by mouth every 6 (six) hours as needed for mild pain.     [provider]  albuterol (PROAIR HFA) 108 (90 Base) MCG/ACT inhaler Inhale 2 puffs into the lungs every 6 (six) hours as needed for wheezing or shortness of breath. 01/17/21   Terressa Koyanagi, DO  doxycycline (ADOXA) 50 MG tablet Take 1 tablet by mouth daily with food and water, use sun protection 10/09/20     levonorgestrel (MIRENA, 52 MG,) 20 MCG/24HR IUD 1 Intra Uterine Device (1 each total) by Intrauterine route once for 1 dose. 11/10/18 10/21/21  Koberlein, Paris Lore, MD  SUMAtriptan (IMITREX) 100 MG tablet TAKE 1 TABLET BY MOUTH AT ONSET OF MIGRAINE. CAN REPEAT IN 2 HOURS IF MIGRAINE STILL PRESENT. NOT TO EXCEED 2 DOSES IN 24 HOURS. 04/02/20 10/21/21  Wynn Banker, MD  topiramate (TOPAMAX) 50 MG tablet TAKE 1 TABLET BY MOUTH IN THE MORNING AND 2 TABLETS BY MOUTH AT BEDTIME Patient taking differently: 150 mg. PT is taking 3 tab in morning and 3 at bed time. 04/10/21 04/10/22  Wynn Banker, MD  VITAMIN D PO Take by mouth.    [provider]    Family History Family History  Problem Relation Age of Onset   Diabetes Father    Hyperlipidemia Father    Early death Sister        stillborn   Heart disease Brother    Heart attack Brother 17   Arthritis  Maternal Grandmother    Diabetes Maternal Grandmother    Heart attack Maternal Grandmother    Throat cancer Maternal Grandfather    Diabetes Paternal Grandfather    Stroke Paternal Grandfather     Social History Social History   Tobacco Use   Smoking status: Never   Smokeless tobacco: Never  Substance Use Topics   Alcohol use: No   Drug use: No     Allergies   Aimovig [erenumab-aooe] and Sulfa antibiotics   Review of Systems Review of Systems Per HPI  Physical Exam Triage Vital Signs ED Triage Vitals  Enc Vitals Group     BP 12/06/21 1835 120/88     Pulse Rate 12/06/21 1835 89     Resp 12/06/21 1835 20     Temp 12/06/21 1835 99.2 F (37.3 C)     Temp Source 12/06/21 1835 Oral     SpO2  12/06/21 1835 97 %     Weight --      Height --      Head Circumference --      Peak Flow --      Pain Score 12/06/21 1836 10     Pain Loc --      Pain Edu? --      Excl. in GC? --    No data found.  Updated Vital Signs BP 120/88 (BP Location: Right Arm)   Pulse 89   Temp 99.2 F (37.3 C) (Oral)   Resp 20   SpO2 97%   Visual Acuity Right Eye Distance:   Left Eye Distance:   Bilateral Distance:    Right Eye Near:   Left Eye Near:    Bilateral Near:     Physical Exam Vitals and nursing note reviewed.  Constitutional:      Appearance: Normal appearance. She is not ill-appearing.  HENT:     Head: Atraumatic.     Right Ear: Tympanic membrane and external ear normal.     Left Ear: Tympanic membrane and external ear normal.     Nose: Rhinorrhea present.     Mouth/Throat:     Mouth: Mucous membranes are moist.     Pharynx: Posterior oropharyngeal erythema present.  Eyes:     Extraocular Movements: Extraocular movements intact.     Conjunctiva/sclera: Conjunctivae normal.  Cardiovascular:     Rate and Rhythm: Normal rate and regular rhythm.     Heart sounds: Normal heart sounds.  Pulmonary:     Effort: Pulmonary effort is normal.     Breath sounds: Normal breath sounds. No wheezing or rales.  Musculoskeletal:        General: Normal range of motion.     Cervical back: Normal range of motion and neck supple.  Skin:    General: Skin is warm and dry.  Neurological:     Mental Status: She is alert and oriented to person, place, and time.  Psychiatric:        Mood and Affect: Mood normal.        Thought Content: Thought content normal.        Judgment: Judgment normal.      UC Treatments / Results  Labs (all labs ordered are listed, but only abnormal results are displayed) Labs Reviewed  RESP PANEL BY RT-PCR (FLU A&B, COVID) [id number]    EKG   Radiology No results found.  Procedures Procedures (including critical care time)  Medications Ordered in  UC Medications - No data to display  Initial Impression /  Assessment and Plan / UC Course  I have reviewed the triage vital signs and the nursing notes.  Pertinent labs & imaging results that were available during my care of the patient were reviewed by me and considered in my medical decision making (see chart for details).     Overall exam and vital signs reassuring and suggestive of a viral upper respiratory infection.  Given home exposure and symptoms very strong suspicion for COVID-19 infection.  Respiratory panel pending, will treat with molnupiravir, Phenergan DM, albuterol inhaler in addition to over-the-counter supportive medications and home care.  Return for any worsening symptoms.  Final Clinical Impressions(s) / UC Diagnoses   Final diagnoses:  Acute cough  Viral URI with cough  Exposure to COVID-19 virus   Discharge Instructions   None    ED Prescriptions     Medication Sig Dispense Auth. Provider   albuterol (VENTOLIN HFA) 108 (90 Base) MCG/ACT inhaler Inhale 1-2 puffs into the lungs every 6 (six) hours as needed for wheezing or shortness of breath. 18 g Roosvelt Maser [place], [place]   promethazine-dextromethorphan (PROMETHAZINE-DM) 6.25-15 MG/5ML syrup Take 5 mLs by mouth 4 (four) times daily as needed. 100 mL Particia Nearing, PA-C   molnupiravir EUA (LAGEVRIO) 200 mg CAPS capsule Take 4 capsules (800 mg total) by mouth 2 (two) times daily for 5 days. 40 capsule Particia Nearing, [place]      PDMP not reviewed this encounter.   Particia Nearing, [place] 12/06/21 2013

## 2021-12-10 ENCOUNTER — Ambulatory Visit
Admission: EM | Admit: 2021-12-10 | Discharge: 2021-12-10 | Disposition: A | Discharge status: Home / Self Care | Payer: 59 | Attending: Nurse Practitioner | Admitting: Nurse Practitioner

## 2021-12-10 ENCOUNTER — Ambulatory Visit (INDEPENDENT_AMBULATORY_CARE_PROVIDER_SITE_OTHER): Payer: 59

## 2021-12-10 DIAGNOSIS — J22 Unspecified acute lower respiratory infection: Secondary | ICD-10-CM | POA: Diagnosis not present

## 2021-12-10 DIAGNOSIS — J40 Bronchitis, not specified as acute or chronic: Secondary | ICD-10-CM | POA: Diagnosis not present

## 2021-12-10 DIAGNOSIS — R0602 Shortness of breath: Secondary | ICD-10-CM | POA: Diagnosis not present

## 2021-12-10 DIAGNOSIS — R059 Cough, unspecified: Secondary | ICD-10-CM | POA: Diagnosis not present

## 2021-12-10 MED ORDER — PREDNISONE 50 MG PO TABS
ORAL_TABLET | ORAL | 0 refills | Status: DC
Start: 2021-12-10 — End: 2021-12-25

## 2021-12-10 MED ORDER — DEXAMETHASONE SODIUM PHOSPHATE 10 MG/ML IJ SOLN
10.0000 mg | INTRAMUSCULAR | Status: AC
  Administered 2021-12-10: 10 mg via INTRAMUSCULAR

## 2021-12-10 MED ORDER — AMOXICILLIN-POT CLAVULANATE 875-125 MG PO TABS: 1.0000 | ORAL_TABLET | Freq: Two times a day (BID) | ORAL | 0 refills | Status: DC

## 2021-12-10 MED ORDER — ONDANSETRON 4 MG PO TBDP: 4.0000 mg | ORAL_TABLET | Freq: Three times a day (TID) | ORAL | 0 refills | Status: DC | PRN

## 2021-12-10 NOTE — Discharge Instructions (Addendum)
Take medication as prescribed. Increase fluids and allow for plenty of rest. Recommend Tylenol or ibuprofen as needed for pain, fever, or general discomfort. Warm salt water gargles 3-4 times daily to help with throat pain or discomfort. Recommend using a humidifier at bedtime during sleep to help with cough and nasal congestion. Sleep elevated on 2 pillows while cough symptoms persist. Follow-up with your PCP if symptoms fail to improve.

## 2021-12-10 NOTE — ED Provider Notes (Signed)
[facility]    CSN: [id number] Arrival date & time: 12/10/21  1623      History   Chief Complaint Chief Complaint  Patient presents with   Cough   Fatigue        Nausea   Headache   Dizziness    HPI Debra Parker is a 36 y.o. female.   The history is provided by the patient and a relative.   Patient presents for complaints of worsening cough, headache, nausea, fatigue, decreased appetite, and lightheadedness that have been present for the past week.  Patient was seen in this clinic on 12/06/2021, and her test results show that she was positive for influenza A.  Patient states that she was initially prescribed an antiviral, but stopped the medication after her results were received.  She states that she continues to have cough, but now it is worse, she states that she is coughing up "green stuff".  She also states that she has a terrible headache and is fatigued.  She denies new onset fever, chills, nasal congestion, runny nose.  She also states that she does have some shortness of breath and noticed a slight wheeze.  She reports a history of chronic bronchitis.  She states that prior to her symptoms starting, she went on a cruise for her wedding anniversary.  She has been taking promethazine DM and using her albuterol inhaler with minimal relief.  Patient states that her husband also tested positive for COVID-19 after they return from their cruise.  Past Medical History:  Diagnosis Date   Bronchitis    Diverticulitis 2019   Hypertension    when pregnant   Migraines     Patient Active Problem List   Diagnosis Date Noted   Lichen planopilaris 02/19/2020   Migraine 02/17/2020   Morbid obesity (HCC) 02/01/2018    Past Surgical History:  Procedure Laterality Date   CESAREAN SECTION  2007   fatty pocket removed  1999   WISDOM TOOTH EXTRACTION  2000    OB History   No obstetric history on file.      Home Medications    Prior to Admission  medications   Medication Sig Start Date End Date Taking? Authorizing Provider  amoxicillin-clavulanate (AUGMENTIN) 875-125 MG tablet Take 1 tablet by mouth every 12 (twelve) hours. 12/10/21  Yes Banessa Mao-Warren, Sadie Haber, NP  ondansetron (ZOFRAN-ODT) 4 MG disintegrating tablet Take 1 tablet (4 mg total) by mouth every 8 (eight) hours as needed for nausea or vomiting. 12/10/21  Yes Raquon Milledge-Warren, Sadie Haber, NP  predniSONE (DELTASONE) 50 MG tablet Take 1 tablet daily with breakfast for the next 5 days. 12/10/21  Yes Earsie Humm-Warren, Sadie Haber, NP  acetaminophen (TYLENOL) 500 MG tablet Take 500 mg by mouth every 6 (six) hours as needed for mild pain.    [provider]  albuterol (PROAIR HFA) 108 (90 Base) MCG/ACT inhaler Inhale 2 puffs into the lungs every 6 (six) hours as needed for wheezing or shortness of breath. 01/17/21   Terressa Koyanagi, DO  albuterol (VENTOLIN HFA) 108 (90 Base) MCG/ACT inhaler Inhale 1-2 puffs into the lungs every 6 (six) hours as needed for wheezing or shortness of breath. 12/06/21   Particia Nearing, PA-C  doxycycline (ADOXA) 50 MG tablet Take 1 tablet by mouth daily with food and water, use sun protection 10/09/20     levonorgestrel (MIRENA, 52 MG,) 20 MCG/24HR IUD 1 Intra Uterine Device (1 each total) by Intrauterine route once for 1 dose.  11/10/18 10/21/21  Wynn Banker, MD  molnupiravir EUA (LAGEVRIO) 200 mg CAPS capsule Take 4 capsules (800 mg total) by mouth 2 (two) times daily for 5 days. 12/06/21 12/11/21  Particia Nearing, PA-C  promethazine-dextromethorphan (PROMETHAZINE-DM) 6.25-15 MG/5ML syrup Take 5 mLs by mouth 4 (four) times daily as needed. 12/06/21   Particia Nearing, PA-C  SUMAtriptan (IMITREX) 100 MG tablet TAKE 1 TABLET BY MOUTH AT ONSET OF MIGRAINE. CAN REPEAT IN 2 HOURS IF MIGRAINE STILL PRESENT. NOT TO EXCEED 2 DOSES IN 24 HOURS. 04/02/20 10/21/21  Wynn Banker, MD  topiramate (TOPAMAX) 50 MG tablet TAKE 1 TABLET BY MOUTH IN THE MORNING AND  2 TABLETS BY MOUTH AT BEDTIME Patient taking differently: 150 mg. PT is taking 3 tab in morning and 3 at bed time. 04/10/21 04/10/22  Wynn Banker, MD  VITAMIN D PO Take by mouth.    [provider]    Family History Family History  Problem Relation Age of Onset   Diabetes Father    Hyperlipidemia Father    Early death Sister        stillborn   Heart disease Brother    Heart attack Brother 30   Arthritis Maternal Grandmother    Diabetes Maternal Grandmother    Heart attack Maternal Grandmother    Throat cancer Maternal Grandfather    Diabetes Paternal Grandfather    Stroke Paternal Grandfather     Social History Social History   Tobacco Use   Smoking status: Never   Smokeless tobacco: Never  Substance Use Topics   Alcohol use: No   Drug use: No     Allergies   Aimovig [erenumab-aooe] and Sulfa antibiotics   Review of Systems Review of Systems Per HPI  Physical Exam Triage Vital Signs ED Triage Vitals [12/10/21 1805]  Enc Vitals Group     BP 138/87     Pulse Rate 86     Resp 18     Temp 98.5 F (36.9 C)     Temp Source Oral     SpO2 97 %     Weight      Height      Head Circumference      Peak Flow      Pain Score 4     Pain Loc      Pain Edu?      Excl. in GC?    No data found.  Updated Vital Signs BP 138/87 (BP Location: Right Arm)   Pulse 86   Temp 98.5 F (36.9 C) (Oral)   Resp 18   SpO2 97%   Visual Acuity Right Eye Distance:   Left Eye Distance:   Bilateral Distance:    Right Eye Near:   Left Eye Near:    Bilateral Near:     Physical Exam Vitals and nursing note reviewed.  Constitutional:      General: She is not in acute distress.    Appearance: She is well-developed.  HENT:     Head: Normocephalic.     Right Ear: Tympanic membrane, ear canal and external ear normal.     Left Ear: Tympanic membrane, ear canal and external ear normal.     Nose: Nose normal.     Mouth/Throat:     Mouth: Mucous membranes are  moist.  Eyes:     Extraocular Movements: Extraocular movements intact.     Pupils: Pupils are equal, round, and reactive to light.  Cardiovascular:  Rate and Rhythm: Regular rhythm.  Pulmonary:     Effort: Pulmonary effort is normal.     Breath sounds: Normal breath sounds.  Abdominal:     General: Bowel sounds are normal.     Palpations: Abdomen is soft.     Tenderness: There is no abdominal tenderness.  Musculoskeletal:        General: Normal range of motion.     Cervical back: Normal range of motion.  Skin:    General: Skin is warm and dry.  Neurological:     General: No focal deficit present.     Mental Status: She is alert and oriented to person, place, and time.  Psychiatric:        Mood and Affect: Mood normal.        Behavior: Behavior normal.      UC Treatments / Results  Labs (all labs ordered are listed, but only abnormal results are displayed) Labs Reviewed - No data to display  EKG   Radiology DG Chest 2 View  Result Date: 12/10/2021 CLINICAL DATA:  Cough, shortness of breath EXAM: CHEST - 2 VIEW COMPARISON:  07/22/2018 FINDINGS: The heart size and mediastinal contours are within normal limits. Patient's chin is partially obscuring the apices. There are no signs of pulmonary edema or focal pulmonary consolidation. There is no pleural effusion or pneumothorax. The visualized skeletal structures are unremarkable. IMPRESSION: No active cardiopulmonary disease. Electronically Signed   By: Ernie Avena M.D.   On: 12/10/2021 19:19    Procedures Procedures (including critical care time)  Medications Ordered in UC Medications  dexamethasone (DECADRON) injection 10 mg (10 mg Intramuscular Given 12/10/21 1919)    Initial Impression / Assessment and Plan / UC Course  I have reviewed the triage vital signs and the nursing notes.  Pertinent labs & imaging results that were available during my care of the patient were reviewed by me and considered in my  medical decision making (see chart for details).  Patient presents with worsening cough, fatigue, headache, nausea, and shortness of breath.  Patient was seen in this clinic on 12/06/2021, but since that time states symptoms have worsened.  She was diagnosed with influenza per testing.  Patient's vital signs are stable, she is in mild distress due to the persistent coughing.  Chest x-ray was performed which was negative for pneumonia or other cardiopulmonary etiology.  Based on the patient's presentation, I am still going to place her on Augmentin at this time.  We will also prescribe prednisone 50 mg tablets for the next 5 days.  Patient was started with Decadron 10 mg IM injection in the clinic today.  Differential diagnoses include acute bronchitis, acute upper respiratory infection, and influenza.  Supportive care recommendations were provided to the patient.  Discussed indications of when to go to the emergency department.  Patient was advised to follow-up with her primary care physician if symptoms fail to improve.  Patient verbalizes understanding.  All questions were answered. Final Clinical Impressions(s) / UC Diagnoses   Final diagnoses:  Acute lower respiratory infection  Bronchitis     Discharge Instructions      Take medication as prescribed. Increase fluids and allow for plenty of rest. Recommend Tylenol or ibuprofen as needed for pain, fever, or general discomfort. Warm salt water gargles 3-4 times daily to help with throat pain or discomfort. Recommend using a humidifier at bedtime during sleep to help with cough and nasal congestion. Sleep elevated on 2 pillows while cough symptoms  persist. Follow-up with your PCP if symptoms fail to improve.      ED Prescriptions     Medication Sig Dispense Auth. Provider   amoxicillin-clavulanate (AUGMENTIN) 875-125 MG tablet Take 1 tablet by mouth every 12 (twelve) hours. 14 tablet Tamula Morrical-Warren, Sadie Haber, NP   predniSONE (DELTASONE)  50 MG tablet Take 1 tablet daily with breakfast for the next 5 days. 5 tablet Haadi Santellan-Warren, Sadie Haber, NP   ondansetron (ZOFRAN-ODT) 4 MG disintegrating tablet Take 1 tablet (4 mg total) by mouth every 8 (eight) hours as needed for nausea or vomiting. 20 tablet Art Levan-Warren, Sadie Haber, NP      PDMP not reviewed this encounter.   Abran Cantor, NP 12/10/21 1931

## 2021-12-10 NOTE — ED Triage Notes (Signed)
Pt reports cough, headache, nausea, low appetite,  fatigue and lightheaded x 1 week. Pt reports terrible history of bronchitis.    Pt tested positive for Flu on 12/06/2021.  Pt reports husband tested positive for COVID on 12/03/2021.

## 2021-12-25 ENCOUNTER — Ambulatory Visit: Payer: 59 | Admitting: Family Medicine

## 2021-12-25 ENCOUNTER — Other Ambulatory Visit (HOSPITAL_COMMUNITY): Payer: Self-pay

## 2021-12-25 ENCOUNTER — Encounter: Payer: Self-pay | Admitting: Family Medicine

## 2021-12-25 DIAGNOSIS — G43809 Other migraine, not intractable, without status migrainosus: Secondary | ICD-10-CM | POA: Diagnosis not present

## 2021-12-25 DIAGNOSIS — G4733 Obstructive sleep apnea (adult) (pediatric): Secondary | ICD-10-CM | POA: Insufficient documentation

## 2021-12-25 DIAGNOSIS — R7303 Prediabetes: Secondary | ICD-10-CM | POA: Insufficient documentation

## 2021-12-25 LAB — POCT GLYCOSYLATED HEMOGLOBIN (HGB A1C): Hemoglobin A1C: 6 % — AB (ref 4.0–5.6)

## 2021-12-25 MED ORDER — LIRAGLUTIDE -WEIGHT MANAGEMENT 18 MG/3ML ~~LOC~~ SOPN
PEN_INJECTOR | SUBCUTANEOUS | 0 refills | Status: DC
  Filled 2021-12-25 – 2021-12-27 (×2): qty 9, 37d supply, fill #0

## 2021-12-25 MED ORDER — TOPIRAMATE 50 MG PO TABS
150.0000 mg | ORAL_TABLET | Freq: Two times a day (BID) | ORAL | 1 refills | Status: DC
  Filled 2021-12-25: qty 540, 90d supply, fill #0
  Filled 2022-06-20: qty 540, 90d supply, fill #1

## 2021-12-25 NOTE — Assessment & Plan Note (Signed)
Mild-mod seen on her sleep study, she did receive a CPAP and she has been wearing it, states that it is not helping much with her migraines.

## 2021-12-25 NOTE — Assessment & Plan Note (Signed)
A1C performed in office today and is slightly worse than previous, we extensively discussed reducing sugar and starches in her diet, handouts were given. See above.

## 2021-12-25 NOTE — Patient Instructions (Signed)
Total daily protein intake: at least 100 grams per day  Total fiber intake: at least 25 grams   Total calorie intake: 2000 calories per day  Total carbohydrate: less than 100 grams per day  My fitness pal, Lose it, and Carb manager are all good apps to use for calorie and carb tracking.

## 2021-12-25 NOTE — Assessment & Plan Note (Signed)
I have had an extensive 42 minute conversation today with the patient about healthy eating habits, exercise, calorie and carb goals for sustainable and successful weight loss. I gave the patient caloric and protein daily intake values as well as described the importance of increasing fiber and water intake. We will start liraglutide injections daily and increase her dose as tolerated to assist with weight loss. Handouts on low carb foods were given and she was encouraged to use apps on her phone. I will be seeing her next week for her annual physical and we will review and exercise regimen for her then.

## 2021-12-25 NOTE — Progress Notes (Signed)
Established Patient Office Visit  Subjective   Patient ID: Debra Parker, female    DOB: 1985-08-15  Age: 36 y.o. MRN: [id number]  Chief Complaint  Patient presents with   Establish Care    Patient she is here for transition of care visit.  Patient had the flu in the begining of the month, has a history of chronic bronchitis. Is continuing to take her inhalers regularly. She finished her course of antibiotics. Is finally starting to feel better but her voice is still very hoarse.  Morbid obesity-- patient reports that she has tried Rybelsus 3 mg in the past, however she did not tolerate the 7 mg dose, it made her very nauseated so she could not increase the medication. Patient states that she was cutting back on calories at the time but wasn't using any apps to track her calories. I spent 42 minutes in discussion with this patient about weight loss options, dietary protocols and medications we could use to help.   Prediabetes-- A1C today is 6.0 in the office. We had a long discussion about reducing sugar and starches in her diet and handouts were given. Pt reports she does not have a specific exercise regimen.   Current Outpatient Medications  Medication Instructions   acetaminophen (TYLENOL) 500 mg, Oral, Every 6 hours PRN   albuterol (VENTOLIN HFA) 108 (90 Base) MCG/ACT inhaler 1-2 puffs, Inhalation, Every 6 hours PRN   doxycycline (ADOXA) 50 MG tablet Take 1 tablet by mouth daily with food and water, use sun protection   levonorgestrel (MIRENA, 52 MG,) 20 MCG/24HR IUD 1 each, Intrauterine,  Once   ondansetron (ZOFRAN-ODT) 4 mg, Oral, Every 8 hours PRN   promethazine-dextromethorphan (PROMETHAZINE-DM) 6.25-15 MG/5ML syrup 5 mLs, Oral, 4 times daily PRN   SUMAtriptan (IMITREX) 100 MG tablet TAKE 1 TABLET BY MOUTH AT ONSET OF MIGRAINE. CAN REPEAT IN 2 HOURS IF MIGRAINE STILL PRESENT. NOT TO EXCEED 2 DOSES IN 24 HOURS.   topiramate (TOPAMAX) 50 MG tablet TAKE 1 TABLET BY MOUTH IN  THE MORNING AND 2 TABLETS BY MOUTH AT BEDTIME   VITAMIN D PO Oral    Patient Active Problem List   Diagnosis Date Noted   Prediabetes 12/25/2021   OSA (obstructive sleep apnea) 96/29/5284   Lichen planopilaris 13/24/4010   Migraine 02/17/2020   Morbid obesity ([place]) 02/01/2018      Review of Systems  All other systems reviewed and are negative.     Objective:     BP 96/70 (BP Location: Right Arm, Patient Position: Sitting, Cuff Size: Large)   Pulse 75   Temp 98.8 F (37.1 C) (Oral)   Ht 5\' 6"  (1.676 m)   Wt 284 lb 8 oz (129 kg)   SpO2 98%   BMI 45.92 kg/m  BP Readings from Last 3 Encounters:  12/25/21 96/70  12/10/21 138/87  12/06/21 120/88   Wt Readings from Last 3 Encounters:  12/25/21 284 lb 8 oz (129 kg)  10/21/21 285 lb (129.3 kg)  04/22/21 280 lb (127 kg)      Physical Exam Vitals reviewed.  Constitutional:      Appearance: Normal appearance. She is well-groomed. She is obese.  HENT:     Head: Normocephalic and atraumatic.  Eyes:     Conjunctiva/sclera: Conjunctivae normal.  Neck:     Thyroid: No thyromegaly.  Cardiovascular:     Rate and Rhythm: Normal rate and regular rhythm.     Heart sounds: S1 normal and S2 normal.  Pulmonary:     Effort: Pulmonary effort is normal.     Breath sounds: Normal breath sounds and air entry.  Abdominal:     General: Bowel sounds are normal.  Musculoskeletal:        General: Normal range of motion.     Right lower leg: No edema.     Left lower leg: No edema.  Skin:    General: Skin is warm and dry.  Neurological:     Mental Status: She is alert and oriented to person, place, and time. Mental status is at baseline.     Gait: Gait is intact.  Psychiatric:        Mood and Affect: Mood and affect normal.        Speech: Speech normal.        Behavior: Behavior normal.        Judgment: Judgment normal.      Results for orders placed or performed in visit on 12/25/21  POC HgB A1c  Result Value Ref Range    Hemoglobin A1C 6.0 (A) 4.0 - 5.6 %   HbA1c POC (<> result, manual entry)     HbA1c, POC (prediabetic range)     HbA1c, POC (controlled diabetic range)        The ASCVD Risk score (Arnett DK, et al., 2019) failed to calculate for the following reasons:   The 2019 ASCVD risk score is only valid for ages 64 to 37    Assessment & Plan:   Problem List Items Addressed This Visit       Cardiovascular and Mediastinum   Migraine    On topamax currently, pt reports continued issues with her migraines, will refill her topamax today and she already has an appointment with neurology for evaluation.      Relevant Medications   topiramate (TOPAMAX) 50 MG tablet     Respiratory   OSA (obstructive sleep apnea) (Chronic)    Mild-mod seen on her sleep study, she did receive a CPAP and she has been wearing it, states that it is not helping much with her migraines.         Other   Morbid obesity (HCC) - Primary    I have had an extensive 42 minute conversation today with the patient about healthy eating habits, exercise, calorie and carb goals for sustainable and successful weight loss. I gave the patient caloric and protein daily intake values as well as described the importance of increasing fiber and water intake. We will start liraglutide injections daily and increase her dose as tolerated to assist with weight loss. Handouts on low carb foods were given and she was encouraged to use apps on her phone. I will be seeing her next week for her annual physical and we will review and exercise regimen for her then.       Relevant Medications   Liraglutide -Weight Management 18 MG/3ML SOPN   Other Relevant Orders   TSH   Prediabetes    A1C performed in office today and is slightly worse than previous, we extensively discussed reducing sugar and starches in her diet, handouts were given. See above.      Relevant Medications   Liraglutide -Weight Management 18 MG/3ML SOPN   Other Relevant Orders    POC HgB A1c (Completed)   CMP   Lipid Panel    No follow-ups on file.    Karie Georges, MD

## 2021-12-25 NOTE — Assessment & Plan Note (Signed)
On topamax currently, pt reports continued issues with her migraines, will refill her topamax today and she already has an appointment with neurology for evaluation.

## 2021-12-26 ENCOUNTER — Other Ambulatory Visit (HOSPITAL_COMMUNITY): Payer: Self-pay

## 2021-12-27 ENCOUNTER — Encounter: Payer: Self-pay | Admitting: Family Medicine

## 2021-12-27 ENCOUNTER — Other Ambulatory Visit (HOSPITAL_COMMUNITY): Payer: Self-pay

## 2021-12-31 ENCOUNTER — Ambulatory Visit (INDEPENDENT_AMBULATORY_CARE_PROVIDER_SITE_OTHER): Payer: 59 | Admitting: Family Medicine

## 2021-12-31 ENCOUNTER — Encounter: Payer: Self-pay | Admitting: Family Medicine

## 2021-12-31 VITALS — BP 104/68 | HR 75 | Temp 98.3°F | Ht 64.5 in | Wt 285.6 lb

## 2021-12-31 DIAGNOSIS — Z0000 Encounter for general adult medical examination without abnormal findings: Secondary | ICD-10-CM | POA: Diagnosis not present

## 2021-12-31 DIAGNOSIS — Z1159 Encounter for screening for other viral diseases: Secondary | ICD-10-CM | POA: Diagnosis not present

## 2021-12-31 DIAGNOSIS — E559 Vitamin D deficiency, unspecified: Secondary | ICD-10-CM

## 2021-12-31 DIAGNOSIS — Z114 Encounter for screening for human immunodeficiency virus [HIV]: Secondary | ICD-10-CM | POA: Diagnosis not present

## 2021-12-31 LAB — VITAMIN D 25 HYDROXY (VIT D DEFICIENCY, FRACTURES): VITD: 14.82 ng/mL — ABNORMAL LOW (ref 30.00–100.00)

## 2021-12-31 NOTE — Assessment & Plan Note (Signed)
Last level was 13, very low, she was taking OTC supplements for this, will check new level this year.

## 2021-12-31 NOTE — Progress Notes (Signed)
Complete physical exam  Patient: Debra Parker   DOB: 21-Dec-1985   36 y.o. Female  MRN: [id number]  Subjective:    Chief Complaint  Patient presents with   Annual Exam    Debra Parker is a 36 y.o. female who presents today for a complete physical exam. She reports consuming a  Low carbohydrate  diet. The patient does not participate in regular exercise at present. She generally feels fairly well. She reports sleeping well. She does not have additional problems to discuss today.    Most recent fall risk assessment:     No data to display           Most recent depression screenings:    12/31/2021    8:00 AM 12/25/2021    8:15 AM  PHQ 2/9 Scores  PHQ - 2 Score 0 0  PHQ- 9 Score 0 0    Vision:Within last year  Patient Active Problem List   Diagnosis Date Noted   Vitamin D deficiency 12/31/2021   Prediabetes 12/25/2021   OSA (obstructive sleep apnea) 71/69/6789   Lichen planopilaris 38/01/1750   Migraine 02/17/2020   Morbid obesity ([place]) 02/01/2018      Patient Care Team: Farrel Conners, MD as PCP - General (Family Medicine) Ob/Gyn, [facility] (Obstetrics and Gynecology) Dermatology, [facility]   Outpatient Medications Prior to Visit  Medication Sig   acetaminophen (TYLENOL) 500 MG tablet Take 500 mg by mouth every 6 (six) hours as needed for mild pain.   albuterol (VENTOLIN HFA) 108 (90 Base) MCG/ACT inhaler Inhale 1-2 puffs into the lungs every 6 (six) hours as needed for wheezing or shortness of breath.   doxycycline (ADOXA) 50 MG tablet Take 1 tablet by mouth daily with food and water, use sun protection   Liraglutide -Weight Management 18 MG/3ML SOPN Inject 0.6 mg into the skin daily for 7 days, THEN 1.2 mg daily for 7 days, THEN 1.8 mg daily.   ondansetron (ZOFRAN-ODT) 4 MG disintegrating tablet Take 1 tablet (4 mg total) by mouth every 8 (eight) hours as needed for nausea or vomiting.   promethazine-dextromethorphan  (PROMETHAZINE-DM) 6.25-15 MG/5ML syrup Take 5 mLs by mouth 4 (four) times daily as needed.   topiramate (TOPAMAX) 50 MG tablet Take 3 tablets (150 mg total) by mouth 2 (two) times daily in the morning and at bedtime   VITAMIN D PO Take by mouth.   levonorgestrel (MIRENA, 52 MG,) 20 MCG/24HR IUD 1 Intra Uterine Device (1 each total) by Intrauterine route once for 1 dose.   SUMAtriptan (IMITREX) 100 MG tablet TAKE 1 TABLET BY MOUTH AT ONSET OF MIGRAINE. CAN REPEAT IN 2 HOURS IF MIGRAINE STILL PRESENT. NOT TO EXCEED 2 DOSES IN 24 HOURS.   No facility-administered medications prior to visit.    Review of Systems  All other systems reviewed and are negative.         Objective:     BP 104/68 (BP Location: Left Arm, Patient Position: Sitting, Cuff Size: Large)   Pulse 75   Temp 98.3 F (36.8 C) (Oral)   Ht 5' 4.5" (1.638 m)   Wt 285 lb 9.6 oz (129.5 kg)   SpO2 100%   BMI 48.27 kg/m  BP Readings from Last 3 Encounters:  12/31/21 104/68  12/25/21 96/70  12/10/21 138/87   Wt Readings from Last 3 Encounters:  12/31/21 285 lb 9.6 oz (129.5 kg)  12/25/21 284 lb 8 oz (129 kg)  10/21/21 285 lb (129.3 kg)  Physical Exam Vitals reviewed.  Constitutional:      Appearance: Normal appearance. She is well-groomed. She is obese.  HENT:     Head: Normocephalic and atraumatic.  Eyes:     Conjunctiva/sclera: Conjunctivae normal.  Neck:     Thyroid: No thyromegaly.  Cardiovascular:     Rate and Rhythm: Normal rate and regular rhythm.     Heart sounds: S1 normal and S2 normal.  Pulmonary:     Effort: Pulmonary effort is normal.     Breath sounds: Normal breath sounds and air entry.  Abdominal:     General: Bowel sounds are normal.  Musculoskeletal:        General: Normal range of motion.     Right lower leg: No edema.     Left lower leg: No edema.  Skin:    General: Skin is warm and dry.  Neurological:     Mental Status: She is alert and oriented to person, place, and  time. Mental status is at baseline.     Gait: Gait is intact.  Psychiatric:        Mood and Affect: Mood and affect normal.        Speech: Speech normal.        Behavior: Behavior normal.        Judgment: Judgment normal.    No results found for any visits on 12/31/21.     Assessment & Plan:    Routine Health Maintenance and Physical Exam  Immunization History  Administered Date(s) Administered   Influenza,inj,Quad PF,6+ Mos 01/25/2014   Influenza-Unspecified 01/06/2016, 12/30/2018, 01/17/2020, 01/10/2021   Moderna Sars-Covid-2 Vaccination 06/11/2019, 07/13/2019    Health Maintenance  Topic Date Due   COVID-19 Vaccine (3 - Moderna series) 01/10/2022 (Originally 09/07/2019)   PAP SMEAR-Modifier  06/25/2022 (Originally 04/07/2021)   INFLUENZA VACCINE  07/07/2022 (Originally 11/05/2021)   Hepatitis C Screening  12/26/2022 (Originally 06/02/2001)   TETANUS/TDAP  01/02/2028   HIV Screening  Completed   HPV VACCINES  Aged Out    Discussed health benefits of physical activity, and encouraged her to engage in regular exercise appropriate for her age and condition.  Problem List Items Addressed This Visit       Other   Vitamin D deficiency    Last level was 13, very low, she was taking OTC supplements for this, will check new level this year.      Relevant Orders   Vitamin D, 25-hydroxy   Other Visit Diagnoses     Encounter for screening for HIV    -  Primary   Relevant Orders   HIV antibody   Need for hepatitis C screening test       Relevant Orders   Hepatitis C antibody screen   Routine general medical examination at a health care facility      Getting her annual labs today, she will continue to see me every 3 months for physician supervised weight loss .    Return in 3 months (on 04/01/2022).     Karie Georges, MD

## 2021-12-31 NOTE — Patient Instructions (Signed)

## 2022-01-01 ENCOUNTER — Other Ambulatory Visit (HOSPITAL_COMMUNITY): Payer: Self-pay

## 2022-01-01 LAB — HEPATITIS C ANTIBODY: Hepatitis C Ab: NONREACTIVE

## 2022-01-01 LAB — HIV ANTIBODY (ROUTINE TESTING W REFLEX): HIV 1&2 Ab, 4th Generation: NONREACTIVE

## 2022-01-01 MED ORDER — VITAMIN D (ERGOCALCIFEROL) 1.25 MG (50000 UNIT) PO CAPS
50000.0000 [IU] | ORAL_CAPSULE | ORAL | 3 refills | Status: DC
  Filled 2022-01-01: qty 12, 84d supply, fill #0
  Filled 2022-04-01: qty 12, 84d supply, fill #1
  Filled 2022-06-20: qty 12, 84d supply, fill #2
  Filled 2022-09-13: qty 12, 84d supply, fill #3

## 2022-01-01 NOTE — Addendum Note (Signed)
Addended by: Farrel Conners on: 01/01/2022 10:41 AM   Modules accepted: Orders

## 2022-01-01 NOTE — Telephone Encounter (Signed)
Fax received from [facility] stating the request was denied and given to PCP for review.

## 2022-01-08 ENCOUNTER — Encounter (INDEPENDENT_AMBULATORY_CARE_PROVIDER_SITE_OTHER): Payer: 59 | Admitting: Family Medicine

## 2022-01-09 DIAGNOSIS — Z0289 Encounter for other administrative examinations: Secondary | ICD-10-CM

## 2022-01-15 ENCOUNTER — Ambulatory Visit (INDEPENDENT_AMBULATORY_CARE_PROVIDER_SITE_OTHER): Payer: 59 | Admitting: Family Medicine

## 2022-01-15 ENCOUNTER — Encounter (INDEPENDENT_AMBULATORY_CARE_PROVIDER_SITE_OTHER): Payer: Self-pay | Admitting: Family Medicine

## 2022-01-15 VITALS — BP 108/72 | HR 70 | Temp 97.8°F | Ht 65.0 in | Wt 281.0 lb

## 2022-01-15 DIAGNOSIS — R7303 Prediabetes: Secondary | ICD-10-CM

## 2022-01-15 DIAGNOSIS — Z1331 Encounter for screening for depression: Secondary | ICD-10-CM

## 2022-01-15 DIAGNOSIS — E559 Vitamin D deficiency, unspecified: Secondary | ICD-10-CM

## 2022-01-15 DIAGNOSIS — G4733 Obstructive sleep apnea (adult) (pediatric): Secondary | ICD-10-CM

## 2022-01-15 DIAGNOSIS — R0602 Shortness of breath: Secondary | ICD-10-CM

## 2022-01-15 DIAGNOSIS — R5383 Other fatigue: Secondary | ICD-10-CM

## 2022-01-15 DIAGNOSIS — Z6841 Body Mass Index (BMI) 40.0 and over, adult: Secondary | ICD-10-CM | POA: Diagnosis not present

## 2022-01-15 DIAGNOSIS — G43809 Other migraine, not intractable, without status migrainosus: Secondary | ICD-10-CM | POA: Diagnosis not present

## 2022-01-17 LAB — FOLATE: Folate: 3.6 ng/mL (ref >3.0)

## 2022-01-17 LAB — CBC WITH DIFFERENTIAL/PLATELET
Basophils Absolute: 0 10*3/uL (ref 0.0–0.2)
Basos: 1 %
EOS (ABSOLUTE): 0.1 10*3/uL (ref 0.0–0.4)
Eos: 1 %
Hematocrit: 41.6 % (ref 34.0–46.6)
Hemoglobin: 13 g/dL (ref 11.1–15.9)
Immature Grans (Abs): 0 10*3/uL (ref 0.0–0.1)
Immature Granulocytes: 0 %
Lymphocytes Absolute: 3.1 10*3/uL (ref 0.7–3.1)
Lymphs: 41 %
MCH: 27.3 pg (ref 26.6–33.0)
MCHC: 31.3 g/dL — ABNORMAL LOW (ref 31.5–35.7)
MCV: 87 fL (ref 79–97)
Monocytes Absolute: 0.4 10*3/uL (ref 0.1–0.9)
Monocytes: 5 %
Neutrophils Absolute: 4 10*3/uL (ref 1.4–7.0)
Neutrophils: 52 %
Platelets: 299 10*3/uL (ref 150–450)
RBC: 4.76 x10E6/uL (ref 3.77–5.28)
RDW: 13.2 % (ref 11.7–15.4)
WBC: 7.6 10*3/uL (ref 3.4–10.8)

## 2022-01-17 LAB — VITAMIN B12: Vitamin B-12: 213 pg/mL — ABNORMAL LOW (ref 232–1245)

## 2022-01-17 LAB — INSULIN, RANDOM: INSULIN: 27.8 u[IU]/mL — ABNORMAL HIGH (ref 2.6–24.9)

## 2022-01-17 LAB — T3: T3, Total: 151 ng/dL (ref 71–180)

## 2022-01-17 LAB — T4, FREE: Free T4: 1.26 ng/dL (ref 0.82–1.77)

## 2022-01-21 ENCOUNTER — Other Ambulatory Visit (HOSPITAL_COMMUNITY): Payer: Self-pay

## 2022-01-21 ENCOUNTER — Encounter: Payer: Self-pay | Admitting: Family Medicine

## 2022-01-27 NOTE — Progress Notes (Signed)
Chief Complaint:   OBESITY Debra Parker (MR# [id number]) is a 36 y.o. female who presents for evaluation and treatment of obesity and related comorbidities. Current BMI is Body mass index is 46.76 kg/m. Debra Parker has been struggling with her weight for many years and has been unsuccessful in either losing weight, maintaining weight loss, or reaching her healthy weight goal.  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 Gained 30 lbs with pregnancy. Gained significant weight after delivery due to HELKP symptoms and preeclampsia. Eats out 3-4 times a week. May order out lunch or grab and go for dinner. Breakfast in am a bowl of Sugar Smacks or Rice Krispies (1-1.5 cups) with whole milk and sausage links (feels full). Lunch at 12pm maybe a ham [id number](1-5 slices and mustard) or salad with grilled chicken (feel full). Dinner is [employer] and cheese dip ( can not eat whole thing, feels full). Previously logged on Lose it app--Avg 70 max protein daily.  Debra Parker is currently in the action stage of change and ready to dedicate time achieving and maintaining a healthier weight. Debra Parker is interested in becoming our patient and working on intensive lifestyle modifications including (but not limited to) diet and exercise for weight loss.  Debra Parker's habits were reviewed today and are as follows: Her family eats meals together, she thinks her family will eat healthier with her, her desired weight loss is 125 lbs, she started gaining weight after pregnancy, her heaviest weight ever was 285 pounds, she is a picky eater and doesn't like to eat healthier foods, she skips meals frequently, and she is frequently drinking liquids with calories.  Depression Screen Debra Parker's Food and Mood (modified PHQ-9) score was 1.     01/15/2022    7:51 AM  Depression screen PHQ 2/9  Decreased Interest 0  Down, Depressed, Hopeless 0  PHQ - 2 Score 0  Altered sleeping 1  Tired, decreased  energy 0  Change in appetite 0  Feeling bad or failure about yourself  0  Trouble concentrating 0  Moving slowly or fidgety/restless 0  Suicidal thoughts 0  PHQ-9 Score 1  Difficult doing work/chores Not difficult at all   Subjective:   1. Other fatigue Debra Parker reports daytime somnolence and reports waking up still tired. Patient has a history of symptoms of morning fatigue and morning headache. Debra Parker generally gets 6 or 7 hours of sleep per night, and states that she has generally restful sleep. Snoring is present. Apneic episodes are not present. Epworth Sleepiness Score is 2.    2. SOBOE (shortness of breath on exertion) Debra Parker notes increasing shortness of breath with exercising and seems to be worsening over time with weight gain. She notes getting out of breath sooner with activity than she used to. This has not gotten worse recently. Debra Parker denies shortness of breath at rest or orthopnea.   3. Prediabetes Debra Parker was diagnosed 1 year ago. Her recent A1c was 6.0.  4. Vitamin D deficiency Debra Parker is on weekly Vit D. Her recent Vit D level of 14.82.  5. OSA (obstructive sleep apnea) with CPAP Debra Parker has a CPAP. She wears for assistance in migraine management as well.  6. Other migraine without status migrainosus, not intractable Debra Parker is on Topiramate 150 mg twice a day. She sees Dr. Rexene Alberts at [facility]. She gets 3 migraines a month.  Assessment/Plan:   1. Other fatigue We will obtain labs today. Debra Parker does feel that her weight is causing her  energy to be lower than it should be. Fatigue may be related to obesity, depression or many other causes. Labs will be ordered, and in the meanwhile, Debra Parker will focus on self care including making healthy food choices, increasing physical activity and focusing on stress reduction.  - EKG 12-Lead  - CBC with Differential/Platelet - Vitamin B12 - Folate - T3 - T4, free  2. SOBOE (shortness of breath on exertion) Debra Parker does feel  that she gets out of breath more easily that she used to when she exercises. Debra Parker's shortness of breath appears to be obesity related and exercise induced. She has agreed to work on weight loss and gradually increase exercise to treat her exercise induced shortness of breath. Will continue to monitor closely.   3. Prediabetes We will obtain labs today.  - Insulin, random  4. Vitamin D deficiency Will repeat Vit D level in January 2024.  5. OSA (obstructive sleep apnea) with CPAP Follow up on sleep at next appointment.  6. Other migraine without status migrainosus, not intractable Continue to follow up with Dr. Frances Furbish.  7. Depression screening Behavior modification techniques were discussed today to help Debra Parker deal with her emotional/non-hunger eating behaviors.  Orders and follow up as documented in patient record.    8. Class 3 severe obesity with serious comorbidity and body mass index (BMI) of 45.0 to 49.9 in adult, unspecified obesity type (HCC) Debra Parker is currently in the action stage of change and her goal is to continue with weight loss efforts. I recommend Debra Parker begin the structured treatment plan as follows:  She has agreed to the Category 3 Plan.  Exercise goals: No exercise has been prescribed at this time.   Behavioral modification strategies: increasing lean protein intake, no skipping meals, meal planning and cooking strategies, keeping healthy foods in the home, better snacking choices, and planning for success.  She was informed of the importance of frequent follow-up visits to maximize her success with intensive lifestyle modifications for her multiple health conditions. She was informed we would discuss her lab results at her next visit unless there is a critical issue that needs to be addressed sooner. Debra Parker agreed to keep her next visit at the agreed upon time to discuss these results.  Objective:   Blood pressure 108/72, pulse 70, temperature 97.8 F (36.6  C), height 5\' 5"  (1.651 m), weight 281 lb (127.5 kg), SpO2 97 %. Body mass index is 46.76 kg/m.  EKG: Normal sinus rhythm, rate 72 bpm.  Indirect Calorimeter completed today shows a VO2 of 284 ml and a REE of 1985.  Her calculated basal metabolic rate is thus her basal metabolic rate is worse than expected.  General: Cooperative, alert, well developed, in no acute distress. HEENT: Conjunctivae and lids unremarkable. Cardiovascular: Regular rhythm.  Lungs: Normal work of breathing. Neurologic: No focal deficits.   Lab Results  Component Value Date   CREATININE 0.64 02/20/2020   BUN 12 02/20/2020   NA 136 02/20/2020   K 3.9 02/20/2020   CL 105 02/20/2020   CO2 21 02/20/2020   Lab Results  Component Value Date   ALT 15 02/20/2020   AST 16 02/20/2020   ALKPHOS 58 11/30/2018   BILITOT 0.3 02/20/2020   Lab Results  Component Value Date   HGBA1C 6.0 (A) 12/25/2021   HGBA1C 5.8 (A) 06/27/2020   HGBA1C 5.9 (H) 02/20/2020   Lab Results  Component Value Date   INSULIN 27.8 (H) 01/15/2022   Lab Results  Component  Value Date   TSH 2.37 02/20/2020   Lab Results  Component Value Date   CHOL 146 02/20/2020   HDL 40 (L) 02/20/2020   LDLCALC 82 02/20/2020   TRIG 141 02/20/2020   CHOLHDL 3.7 02/20/2020   Lab Results  Component Value Date   WBC 7.6 01/15/2022   HGB 13.0 01/15/2022   HCT 41.6 01/15/2022   MCV 87 01/15/2022   PLT 299 01/15/2022   Lab Results  Component Value Date   FERRITIN 41.4 11/30/2018   Attestation Statements:   Reviewed by clinician on day of visit: allergies, medications, problem list, medical history, surgical history, family history, social history, and previous encounter notes.  Time spent on visit including pre-visit chart review and post-visit charting and care was 45 minutes.   I, Fortino Sic, RMA am acting as transcriptionist for Reuben Likes, MD.  This is the patient's first visit at [facility]. The  patient's NEW PATIENT PACKET was reviewed at length. Included in the packet: current and past health history, medications, allergies, ROS, gynecologic history (women only), surgical history, family history, social history, weight history, weight loss surgery history (for those that have had weight loss surgery), nutritional evaluation, mood and food questionnaire, PHQ9, Epworth questionnaire, sleep habits questionnaire, patient life and health improvement goals questionnaire. These will all be scanned into the patient's chart under media.   During the visit, I independently reviewed the patient's EKG, bioimpedance scale results, and indirect calorimeter results. I used this information to tailor a meal plan for the patient that will help her to lose weight and will improve her obesity-related conditions going forward. I performed a medically necessary appropriate examination and/or evaluation. I discussed the assessment and treatment plan with the patient. The patient was provided an opportunity to ask questions and all were answered. The patient agreed with the plan and demonstrated an understanding of the instructions. Labs were ordered at this visit and will be reviewed at the next visit unless more critical results need to be addressed immediately. Clinical information was updated and documented in the EMR.   I have reviewed the above documentation for accuracy and completeness, and I agree with the above. - Reuben Likes, MD

## 2022-01-29 DIAGNOSIS — G4733 Obstructive sleep apnea (adult) (pediatric): Secondary | ICD-10-CM | POA: Diagnosis not present

## 2022-02-04 ENCOUNTER — Ambulatory Visit (INDEPENDENT_AMBULATORY_CARE_PROVIDER_SITE_OTHER): Payer: 59 | Admitting: Family Medicine

## 2022-02-04 VITALS — BP 124/71 | HR 77 | Temp 98.5°F | Ht 65.0 in | Wt 282.0 lb

## 2022-02-04 DIAGNOSIS — Z6841 Body Mass Index (BMI) 40.0 and over, adult: Secondary | ICD-10-CM

## 2022-02-04 DIAGNOSIS — E669 Obesity, unspecified: Secondary | ICD-10-CM

## 2022-02-04 DIAGNOSIS — R7303 Prediabetes: Secondary | ICD-10-CM

## 2022-02-04 DIAGNOSIS — E538 Deficiency of other specified B group vitamins: Secondary | ICD-10-CM | POA: Diagnosis not present

## 2022-02-04 DIAGNOSIS — E559 Vitamin D deficiency, unspecified: Secondary | ICD-10-CM | POA: Diagnosis not present

## 2022-02-05 ENCOUNTER — Encounter (INDEPENDENT_AMBULATORY_CARE_PROVIDER_SITE_OTHER): Payer: Self-pay | Admitting: Family Medicine

## 2022-02-05 NOTE — Progress Notes (Signed)
Chief Complaint:   OBESITY Debra Parker is here to discuss her progress with her obesity treatment plan along with follow-up of her obesity related diagnoses. Debra Parker is on the Category 3 Plan and states she is following her eating plan approximately 75-80% of the time. Debra Parker states she is exercise 0 minutes 0 times per week.  Today's visit was #: 2 Starting weight: 281 lbs Starting date: 01/15/2022 Today's weight: 282 lbs Today's date: 02/04/2022 Total lbs lost to date: 0 lbs Total lbs lost since last in-office visit: 0  Interim History: [place] voices she felt it was a significant amount of food on Cat 3. Felt full and satisfied with quantity fo food. Still working on getting all food in. Breakfast- 3 sausage links and 1 cup cereal (Rice Krispies or Sugar Smacks). Lunch--food from drug rep. Dinner was roast in crock pot.  Subjective:   1. Vitamin D deficiency Debra Parker is currently taking prescription Vit D 50,000 IU once a week. Denies any nausea, vomiting or muscle weakness. She notes fatigue.  2. Vitamin B12 deficiency Debra Parker notes fatigue. B12 level of 213. She is not on supplementation.  3. Prediabetes Debra Parker's A1c 6.0, Insulin 27.8. She is not on medications. Previously diagnosed.  Assessment/Plan:   1. Vitamin D deficiency Continue prescription Vit D 50,000 IU once a week.   2. Vitamin B12 deficiency Debra Parker encouraged to start Prenatal vitamin or a multivitamin daily.  3. Prediabetes Pathophysiology discussed today of IR, Prediabetes, Diabetes.  4. Obesity with current BMI of 47.1 Debra Parker is currently in the action stage of change. As such, her goal is to continue with weight loss efforts. She has agreed to the Category 3 Plan.   Exercise goals: No exercise has been prescribed at this time.  Behavioral modification strategies: increasing lean protein intake, meal planning and cooking strategies, keeping healthy foods in the home, and planning for  success.  Debra Parker has agreed to follow-up with our clinic in 3 weeks. She was informed of the importance of frequent follow-up visits to maximize her success with intensive lifestyle modifications for her multiple health conditions.   Objective:   Blood pressure 124/71, pulse 77, temperature 98.5 F (36.9 C), height 5\' 5"  (1.651 m), weight 282 lb (127.9 kg), SpO2 99 %. Body mass index is 46.93 kg/m.  General: Cooperative, alert, well developed, in no acute distress. HEENT: Conjunctivae and lids unremarkable. Cardiovascular: Regular rhythm.  Lungs: Normal work of breathing. Neurologic: No focal deficits.   Lab Results  Component Value Date   CREATININE 0.64 02/20/2020   BUN 12 02/20/2020   NA 136 02/20/2020   K 3.9 02/20/2020   CL 105 02/20/2020   CO2 21 02/20/2020   Lab Results  Component Value Date   ALT 15 02/20/2020   AST 16 02/20/2020   ALKPHOS 58 11/30/2018   BILITOT 0.3 02/20/2020   Lab Results  Component Value Date   HGBA1C 6.0 (A) 12/25/2021   HGBA1C 5.8 (A) 06/27/2020   HGBA1C 5.9 (H) 02/20/2020   Lab Results  Component Value Date   INSULIN 27.8 (H) 01/15/2022   Lab Results  Component Value Date   TSH 2.37 02/20/2020   Lab Results  Component Value Date   CHOL 146 02/20/2020   HDL 40 (L) 02/20/2020   LDLCALC 82 02/20/2020   TRIG 141 02/20/2020   CHOLHDL 3.7 02/20/2020   Lab Results  Component Value Date   VD25OH 14.82 (L) 12/31/2021   VD25OH 13 (L) 02/20/2020   VD25OH 14.43 (  L) 11/30/2018   Lab Results  Component Value Date   WBC 7.6 01/15/2022   HGB 13.0 01/15/2022   HCT 41.6 01/15/2022   MCV 87 01/15/2022   PLT 299 01/15/2022   Lab Results  Component Value Date   FERRITIN 41.4 11/30/2018   Attestation Statements:   Reviewed by clinician on day of visit: allergies, medications, problem list, medical history, surgical history, family history, social history, and previous encounter notes.  Time spent on visit including pre-visit  chart review and post-visit care and charting was 45 minutes.   I, Fortino Sic, RMA am acting as transcriptionist for Reuben Likes, MD.  I have reviewed the above documentation for accuracy and completeness, and I agree with the above. - Reuben Likes, MD

## 2022-02-15 ENCOUNTER — Telehealth: Payer: 59 | Admitting: Nurse Practitioner

## 2022-02-15 DIAGNOSIS — J40 Bronchitis, not specified as acute or chronic: Secondary | ICD-10-CM

## 2022-02-15 MED ORDER — PSEUDOEPH-BROMPHEN-DM 30-2-10 MG/5ML PO SYRP: 5.0000 mL | ORAL_SOLUTION | Freq: Four times a day (QID) | ORAL | 0 refills | Status: DC | PRN

## 2022-02-15 MED ORDER — PREDNISONE 20 MG PO TABS: 20.0000 mg | ORAL_TABLET | Freq: Every day | ORAL | 0 refills | Status: AC

## 2022-02-15 MED ORDER — AZITHROMYCIN 250 MG PO TABS: ORAL_TABLET | ORAL | 0 refills | Status: AC

## 2022-02-15 NOTE — Patient Instructions (Signed)
Debra Parker, thank you for joining [name], NP for today's virtual visit.  While this provider is not your primary care provider (PCP), if your PCP is located in our provider database this encounter information will be shared with them immediately following your visit.   A [facility] MyChart account gives you access to today's visit and all your visits, tests, and labs performed at [facility] " click here if you don't have a [facility] MyChart account or go to [email or web address]  Consent: (Patient) Debra Parker provided verbal consent for this virtual visit at the beginning of the encounter.  Current Medications:  Current Outpatient Medications:    azithromycin (ZITHROMAX) 250 MG tablet, Take 2 tablets on day 1, then 1 tablet daily on days 2 through 5, Disp: 6 tablet, Rfl: 0   brompheniramine-pseudoephedrine-DM 30-2-10 MG/5ML syrup, Take 5 mLs by mouth 4 (four) times daily as needed., Disp: 240 mL, Rfl: 0   predniSONE (DELTASONE) 20 MG tablet, Take 1 tablet (20 mg total) by mouth daily with breakfast for 5 days., Disp: 5 tablet, Rfl: 0   acetaminophen (TYLENOL) 500 MG tablet, Take 500 mg by mouth every 6 (six) hours as needed for mild pain., Disp: , Rfl:    albuterol (VENTOLIN HFA) 108 (90 Base) MCG/ACT inhaler, Inhale 1-2 puffs into the lungs every 6 (six) hours as needed for wheezing or shortness of breath., Disp: 18 g, Rfl: 0   doxycycline (ADOXA) 50 MG tablet, Take 1 tablet by mouth daily with food and water, use sun protection, Disp: 30 tablet, Rfl: 5   levonorgestrel (MIRENA, 52 MG,) 20 MCG/24HR IUD, 1 Intra Uterine Device (1 each total) by Intrauterine route once for 1 dose., Disp: 1 each, Rfl: 0   SUMAtriptan (IMITREX) 100 MG tablet, TAKE 1 TABLET BY MOUTH AT ONSET OF MIGRAINE. CAN REPEAT IN 2 HOURS IF MIGRAINE STILL PRESENT. NOT TO EXCEED 2 DOSES IN 24 HOURS., Disp: 10 tablet, Rfl: 2   topiramate (TOPAMAX) 50 MG tablet, Take 3 tablets (150  mg total) by mouth 2 (two) times daily in the morning and at bedtime, Disp: 540 tablet, Rfl: 1   Vitamin D, Ergocalciferol, (DRISDOL) 1.25 MG (50000 UNIT) CAPS capsule, Take 1 capsule (50,000 Units total) by mouth every 7 (seven) days., Disp: 12 capsule, Rfl: 3   Medications ordered in this encounter:  Meds ordered this encounter  Medications   predniSONE (DELTASONE) 20 MG tablet    Sig: Take 1 tablet (20 mg total) by mouth daily with breakfast for 5 days.    Dispense:  5 tablet    Refill:  0    Order Specific Question:   Supervising Provider    Answer:   Merrilee Jansky [[id number]]   azithromycin (ZITHROMAX) 250 MG tablet    Sig: Take 2 tablets on day 1, then 1 tablet daily on days 2 through 5    Dispense:  6 tablet    Refill:  0    Order Specific Question:   Supervising Provider    Answer:   Merrilee Jansky [[id number]]   brompheniramine-pseudoephedrine-DM 30-2-10 MG/5ML syrup    Sig: Take 5 mLs by mouth 4 (four) times daily as needed.    Dispense:  240 mL    Refill:  0    Order Specific Question:   Supervising Provider    Answer:   Merrilee Jansky [id number]     *If you need refills on other medications prior to your next appointment, please  contact your pharmacy*  Follow-Up: Call back or seek an in-person evaluation if the symptoms worsen or if the condition fails to improve as anticipated.  [place] [phone]  Other Instructions INSTRUCTIONS: use a humidifier for nasal congestion Drink plenty of fluids, rest and wash hands frequently to avoid the spread of infection Alternate tylenol and Motrin for relief of fever    If you have been instructed to have an in-person evaluation today at a local Urgent Care facility, please use the link below. It will take you to a list of all of our available [facility], including address, phone number and hours of operation. Please do not delay care.  [facility]  If you or a family member  do not have a primary care provider, use the link below to schedule a visit and establish care. When you choose a [facility] primary care physician or advanced practice provider, you gain a long-term partner in health. Find a Primary Care Provider  Learn more about [facility]'s in-office and virtual care options: [facility]

## 2022-02-15 NOTE — Progress Notes (Signed)
Virtual Visit Consent   Debra Parker, you are scheduled for a virtual visit with a [facility] provider today. Just as with appointments in the office, your consent must be obtained to participate. Your consent will be active for this visit and any virtual visit you may have with one of our providers in the next 365 days. If you have a MyChart account, a copy of this consent can be sent to you electronically.  As this is a virtual visit, video technology does not allow for your provider to perform a traditional examination. This may limit your provider's ability to fully assess your condition. If your provider identifies any concerns that need to be evaluated in person or the need to arrange testing (such as labs, EKG, etc.), we will make arrangements to do so. Although advances in technology are sophisticated, we cannot ensure that it will always work on either your end or our end. If the connection with a video visit is poor, the visit may have to be switched to a telephone visit. With either a video or telephone visit, we are not always able to ensure that we have a secure connection.  By engaging in this virtual visit, you consent to the provision of healthcare and authorize for your insurance to be billed (if applicable) for the services provided during this visit. Depending on your insurance coverage, you may receive a charge related to this service.  I need to obtain your verbal consent now. Are you willing to proceed with your visit today? Debra Parker has provided verbal consent on 02/15/2022 for a virtual visit (video or telephone). Debra Rigg, NP  Date: 02/15/2022 9:03 AM  Virtual Visit via Video Note   I, Debra Parker, connected with  Debra Parker  ([id number], [birth year]) on 02/15/22 at  9:00 AM EST by a video-enabled telemedicine application and verified that I am speaking with the correct person using two identifiers.  Location: Patient: Virtual  Visit Location Patient: Home Provider: Virtual Visit Location Provider: Home Office   I discussed the limitations of evaluation and management by telemedicine and the availability of in person appointments. The patient expressed understanding and agreed to proceed.    History of Present Illness: Debra Parker is a 36 y.o. who identifies as a female who was assigned female at birth, and is being seen today for bronchitis.  Acute Bronchitis: Patient presents for presents evaluation of facial pain described as pressure, headache mild, nasal congestion, and productive cough with sputum described as green and brown. Symptoms began a few days ago and are gradually worsening since that time.  Past history is significant for occasional episodes of bronchitis.    Problems:  Patient Active Problem List   Diagnosis Date Noted   Vitamin D deficiency 12/31/2021   Prediabetes 12/25/2021   OSA (obstructive sleep apnea) 12/25/2021   Lichen planopilaris 02/19/2020   Migraine 02/17/2020   Morbid obesity (HCC) 02/01/2018    Allergies:  Allergies  Allergen Reactions   Aimovig [Erenumab-Aooe] Hives   Sulfa Antibiotics Other (See Comments)    Not sure.   Medications:  Current Outpatient Medications:    azithromycin (ZITHROMAX) 250 MG tablet, Take 2 tablets on day 1, then 1 tablet daily on days 2 through 5, Disp: 6 tablet, Rfl: 0   brompheniramine-pseudoephedrine-DM 30-2-10 MG/5ML syrup, Take 5 mLs by mouth 4 (four) times daily as needed., Disp: 240 mL, Rfl: 0   predniSONE (DELTASONE) 20 MG tablet, Take 1 tablet (20  mg total) by mouth daily with breakfast for 5 days., Disp: 5 tablet, Rfl: 0   acetaminophen (TYLENOL) 500 MG tablet, Take 500 mg by mouth every 6 (six) hours as needed for mild pain., Disp: , Rfl:    albuterol (VENTOLIN HFA) 108 (90 Base) MCG/ACT inhaler, Inhale 1-2 puffs into the lungs every 6 (six) hours as needed for wheezing or shortness of breath., Disp: 18 g, Rfl: 0    doxycycline (ADOXA) 50 MG tablet, Take 1 tablet by mouth daily with food and water, use sun protection, Disp: 30 tablet, Rfl: 5   levonorgestrel (MIRENA, 52 MG,) 20 MCG/24HR IUD, 1 Intra Uterine Device (1 each total) by Intrauterine route once for 1 dose., Disp: 1 each, Rfl: 0   SUMAtriptan (IMITREX) 100 MG tablet, TAKE 1 TABLET BY MOUTH AT ONSET OF MIGRAINE. CAN REPEAT IN 2 HOURS IF MIGRAINE STILL PRESENT. NOT TO EXCEED 2 DOSES IN 24 HOURS., Disp: 10 tablet, Rfl: 2   topiramate (TOPAMAX) 50 MG tablet, Take 3 tablets (150 mg total) by mouth 2 (two) times daily in the morning and at bedtime, Disp: 540 tablet, Rfl: 1   Vitamin D, Ergocalciferol, (DRISDOL) 1.25 MG (50000 UNIT) CAPS capsule, Take 1 capsule (50,000 Units total) by mouth every 7 (seven) days., Disp: 12 capsule, Rfl: 3  Observations/Objective: Patient is well-developed, well-nourished in no acute distress.  Resting comfortably at home.  Head is normocephalic, atraumatic.  No labored breathing however she does have  audible nasal congestion  Speech is clear and coherent with logical content.  Patient is alert and oriented at baseline.    Assessment and Plan: 1. Bronchitis - predniSONE (DELTASONE) 20 MG tablet; Take 1 tablet (20 mg total) by mouth daily with breakfast for 5 days.  Dispense: 5 tablet; Refill: 0 - azithromycin (ZITHROMAX) 250 MG tablet; Take 2 tablets on day 1, then 1 tablet daily on days 2 through 5  Dispense: 6 tablet; Refill: 0 - brompheniramine-pseudoephedrine-DM 30-2-10 MG/5ML syrup; Take 5 mLs by mouth 4 (four) times daily as needed.  Dispense: 240 mL; Refill: 0  INSTRUCTIONS: use a humidifier for nasal congestion Drink plenty of fluids, rest and wash hands frequently to avoid the spread of infection Alternate tylenol and Motrin for relief of fever   Follow Up Instructions: I discussed the assessment and treatment plan with the patient. The patient was provided an opportunity to ask questions and all were  answered. The patient agreed with the plan and demonstrated an understanding of the instructions.  A copy of instructions were sent to the patient via MyChart unless otherwise noted below.   The patient was advised to call back or seek an in-person evaluation if the symptoms worsen or if the condition fails to improve as anticipated.  Time:  I spent 11 minutes with the patient via telehealth technology discussing the above problems/concerns.    Debra Rigg, NP

## 2022-03-03 ENCOUNTER — Encounter (INDEPENDENT_AMBULATORY_CARE_PROVIDER_SITE_OTHER): Payer: Self-pay | Admitting: Family Medicine

## 2022-03-03 ENCOUNTER — Ambulatory Visit (INDEPENDENT_AMBULATORY_CARE_PROVIDER_SITE_OTHER): Payer: 59 | Admitting: Family Medicine

## 2022-03-03 VITALS — BP 117/77 | HR 76 | Temp 97.9°F | Ht 65.0 in | Wt 278.0 lb

## 2022-03-03 DIAGNOSIS — E669 Obesity, unspecified: Secondary | ICD-10-CM

## 2022-03-03 DIAGNOSIS — Z6841 Body Mass Index (BMI) 40.0 and over, adult: Secondary | ICD-10-CM | POA: Diagnosis not present

## 2022-03-03 DIAGNOSIS — E559 Vitamin D deficiency, unspecified: Secondary | ICD-10-CM | POA: Diagnosis not present

## 2022-03-12 NOTE — Progress Notes (Signed)
Chief Complaint:   OBESITY Lashonta is here to discuss her progress with her obesity treatment plan along with follow-up of her obesity related diagnoses. Zoelle is on the Category 3 Plan and states she is following her eating plan approximately 80% of the time. Haily states she is exercising 0 minutes 0 times per week.  Today's visit was #: 3 Starting weight: 281 lbs Starting date: 01/15/2022 Today's weight: 278 lbs Today's date: 03/03/2022 Total lbs lost to date: 3 lbs Total lbs lost since last in-office visit: 4  Interim History: Ryiah has been trying to focus on getting more food in consistently and trying to increase protein quantity.  For Thanksgiving she had traditional thanksgiving food.  Next few weeks she has work and going to see Christmas lights.  Subjective:   1. Vitamin D deficiency Niva is currently taking prescription Vit D 50,000 IU once a week.  Her last vitamin D level of 14.82. Denies any nausea, vomiting or muscle weakness. She notes fatigue.  Assessment/Plan:   1. Vitamin D deficiency Continue prescription vitamin D with no refills needed today.  2. Obesity with current BMI of 46.4 Ellean is currently in the action stage of change. As such, her goal is to continue with weight loss efforts. She has agreed to the Category 3 Plan.   Exercise goals: No exercise has been prescribed at this time.  Behavioral modification strategies: increasing lean protein intake, meal planning and cooking strategies, keeping healthy foods in the home, holiday eating strategies , and planning for success.  Justyn has agreed to follow-up with our clinic in 3 weeks. She was informed of the importance of frequent follow-up visits to maximize her success with intensive lifestyle modifications for her multiple health conditions.   Objective:   Blood pressure 117/77, pulse 76, temperature 97.9 F (36.6 C), height 5\' 5"  (1.651 m), weight 278 lb (126.1 kg), SpO2 97  %. Body mass index is 46.26 kg/m.  General: Cooperative, alert, well developed, in no acute distress. HEENT: Conjunctivae and lids unremarkable. Cardiovascular: Regular rhythm.  Lungs: Normal work of breathing. Neurologic: No focal deficits.   Lab Results  Component Value Date   CREATININE 0.64 02/20/2020   BUN 12 02/20/2020   NA 136 02/20/2020   K 3.9 02/20/2020   CL 105 02/20/2020   CO2 21 02/20/2020   Lab Results  Component Value Date   ALT 15 02/20/2020   AST 16 02/20/2020   ALKPHOS 58 11/30/2018   BILITOT 0.3 02/20/2020   Lab Results  Component Value Date   HGBA1C 6.0 (A) 12/25/2021   HGBA1C 5.8 (A) 06/27/2020   HGBA1C 5.9 (H) 02/20/2020   Lab Results  Component Value Date   INSULIN 27.8 (H) 01/15/2022   Lab Results  Component Value Date   TSH 2.37 02/20/2020   Lab Results  Component Value Date   CHOL 146 02/20/2020   HDL 40 (L) 02/20/2020   LDLCALC 82 02/20/2020   TRIG 141 02/20/2020   CHOLHDL 3.7 02/20/2020   Lab Results  Component Value Date   VD25OH 14.82 (L) 12/31/2021   VD25OH 13 (L) 02/20/2020   VD25OH 14.43 (L) 11/30/2018   Lab Results  Component Value Date   WBC 7.6 01/15/2022   HGB 13.0 01/15/2022   HCT 41.6 01/15/2022   MCV 87 01/15/2022   PLT 299 01/15/2022   Lab Results  Component Value Date   FERRITIN 41.4 11/30/2018   Attestation Statements:   Reviewed by 12/02/2018 on day  of visit: allergies, medications, problem list, medical history, surgical history, family history, social history, and previous encounter notes.  I, Elnora Morrison, RMA am acting as transcriptionist for Coralie Common, MD.  I have reviewed the above documentation for accuracy and completeness, and I agree with the above. - Coralie Common, MD

## 2022-03-25 ENCOUNTER — Ambulatory Visit (INDEPENDENT_AMBULATORY_CARE_PROVIDER_SITE_OTHER): Payer: 59 | Admitting: Family Medicine

## 2022-03-25 ENCOUNTER — Encounter (INDEPENDENT_AMBULATORY_CARE_PROVIDER_SITE_OTHER): Payer: Self-pay | Admitting: Family Medicine

## 2022-03-25 VITALS — BP 117/78 | HR 85 | Temp 98.6°F | Ht 65.0 in | Wt 280.0 lb

## 2022-03-25 DIAGNOSIS — Z6841 Body Mass Index (BMI) 40.0 and over, adult: Secondary | ICD-10-CM | POA: Diagnosis not present

## 2022-03-25 DIAGNOSIS — E669 Obesity, unspecified: Secondary | ICD-10-CM | POA: Diagnosis not present

## 2022-03-25 DIAGNOSIS — R7303 Prediabetes: Secondary | ICD-10-CM | POA: Diagnosis not present

## 2022-03-25 DIAGNOSIS — E559 Vitamin D deficiency, unspecified: Secondary | ICD-10-CM

## 2022-04-01 ENCOUNTER — Encounter: Payer: Self-pay | Admitting: Family Medicine

## 2022-04-01 ENCOUNTER — Ambulatory Visit: Payer: 59 | Admitting: Family Medicine

## 2022-04-01 ENCOUNTER — Other Ambulatory Visit (HOSPITAL_COMMUNITY): Payer: Self-pay

## 2022-04-01 ENCOUNTER — Telehealth: Payer: Self-pay

## 2022-04-01 VITALS — BP 104/80 | HR 67 | Temp 98.1°F | Ht 65.0 in | Wt 283.3 lb

## 2022-04-01 DIAGNOSIS — G43809 Other migraine, not intractable, without status migrainosus: Secondary | ICD-10-CM

## 2022-04-01 DIAGNOSIS — R7303 Prediabetes: Secondary | ICD-10-CM

## 2022-04-01 DIAGNOSIS — E538 Deficiency of other specified B group vitamins: Secondary | ICD-10-CM | POA: Diagnosis not present

## 2022-04-01 DIAGNOSIS — E559 Vitamin D deficiency, unspecified: Secondary | ICD-10-CM | POA: Diagnosis not present

## 2022-04-01 LAB — VITAMIN D 25 HYDROXY (VIT D DEFICIENCY, FRACTURES): VITD: 18.25 ng/mL — ABNORMAL LOW (ref 30.00–100.00)

## 2022-04-01 LAB — COMPREHENSIVE METABOLIC PANEL
ALT: 19 U/L (ref 0–35)
AST: 20 U/L (ref 0–37)
Albumin: 4 g/dL (ref 3.5–5.2)
Alkaline Phosphatase: 65 U/L (ref 39–117)
BUN: 12 mg/dL (ref 6–23)
CO2: 23 mEq/L (ref 19–32)
Calcium: 8.8 mg/dL (ref 8.4–10.5)
Chloride: 107 mEq/L (ref 96–112)
Creatinine, Ser: 0.73 mg/dL (ref 0.40–1.20)
GFR: 104.03 mL/min (ref >60.00)
Glucose, Bld: 101 mg/dL — ABNORMAL HIGH (ref 70–99)
Potassium: 3.8 mEq/L (ref 3.5–5.1)
Sodium: 139 mEq/L (ref 135–145)
Total Bilirubin: 0.4 mg/dL (ref 0.2–1.2)
Total Protein: 7.4 g/dL (ref 6.0–8.3)

## 2022-04-01 LAB — VITAMIN B12: Vitamin B-12: 140 pg/mL — ABNORMAL LOW (ref 211–911)

## 2022-04-01 LAB — LIPID PANEL
Cholesterol: 154 mg/dL (ref 0–200)
HDL: 41.1 mg/dL (ref >39.00)
LDL Cholesterol: 93 mg/dL (ref 0–99)
NonHDL: 112.48
Total CHOL/HDL Ratio: 4
Triglycerides: 96 mg/dL (ref 0.0–149.0)
VLDL: 19.2 mg/dL (ref 0.0–40.0)

## 2022-04-01 LAB — POCT GLYCOSYLATED HEMOGLOBIN (HGB A1C): Hemoglobin A1C: 5.8 % — AB (ref 4.0–5.6)

## 2022-04-01 MED ORDER — ZEPBOUND 2.5 MG/0.5ML ~~LOC~~ SOAJ
2.5000 mg | SUBCUTANEOUS | 2 refills | Status: DC
  Filled 2022-04-01: qty 2, 28d supply, fill #0

## 2022-04-01 MED ORDER — AMITRIPTYLINE HCL 25 MG PO TABS
25.0000 mg | ORAL_TABLET | Freq: Every day | ORAL | 2 refills | Status: DC
  Filled 2022-04-01: qty 30, 30d supply, fill #0
  Filled 2022-05-09: qty 30, 30d supply, fill #1
  Filled 2022-06-20: qty 30, 30d supply, fill #2

## 2022-04-01 MED ORDER — ONDANSETRON 4 MG PO TBDP
4.0000 mg | ORAL_TABLET | Freq: Three times a day (TID) | ORAL | 0 refills | Status: DC | PRN
  Filled 2022-04-01: qty 20, 7d supply, fill #0

## 2022-04-01 NOTE — Assessment & Plan Note (Signed)
Pt reports increasing frequency of migraines with the change in weather, states that the sumatriptan causes severe drowsiness so she doesn't like to take this medication. Will start her on amitriptyline 25 mg at bedtime for added control of her migraines.

## 2022-04-01 NOTE — Assessment & Plan Note (Signed)
Currently on 50k unit capsules once weekly, will recheck her level today

## 2022-04-01 NOTE — Progress Notes (Signed)
Established Patient Office Visit  Subjective   Patient ID: Debra Parker, female    DOB: 09-May-1985  Age: 36 y.o. MRN: [id number]  Chief Complaint  Patient presents with   Follow-up    Pt is here for follow up, she has been seeing the weight loss center monthly and trying to follow the diet, however   Migraines-- pt reports that her migraines have worsened in frequency recently. States she is on 150 mg bid of the topiramate. However it doesn't seem to be working as well as previous.   Prediabetes-- A1C performed in office and is 5.8. pt reports she was unable to get the injectable medication I prescribed for her at the last visit. She continues to follow with the weight loss center but has so far not lost any weight. We discussed trying to get the Zepbound for weight loss and she is agreeable to try this medication.   D/ B12 deficiencies-- pt is taking her supplements, 50k unit capsules once weekly for the vitamin D and is taking OTC B12 supplements. Would like to have this rechecked today for follow up.   Current Outpatient Medications  Medication Instructions   acetaminophen (TYLENOL) 500 mg, Oral, Every 6 hours PRN   albuterol (VENTOLIN HFA) 108 (90 Base) MCG/ACT inhaler 1-2 puffs, Inhalation, Every 6 hours PRN   amitriptyline (ELAVIL) 25 mg, Oral, Daily at bedtime   doxycycline (ADOXA) 50 MG tablet Take 1 tablet by mouth daily with food and water, use sun protection   levonorgestrel (MIRENA, 52 MG,) 20 MCG/24HR IUD 1 each, Intrauterine,  Once   ondansetron (ZOFRAN-ODT) 4 mg, Oral, Every 8 hours PRN   SUMAtriptan (IMITREX) 100 MG tablet TAKE 1 TABLET BY MOUTH AT ONSET OF MIGRAINE. CAN REPEAT IN 2 HOURS IF MIGRAINE STILL PRESENT. NOT TO EXCEED 2 DOSES IN 24 HOURS.   topiramate (TOPAMAX) 50 MG tablet Take 3 tablets (150 mg total) by mouth 2 (two) times daily in the morning and at bedtime   Vitamin D (Ergocalciferol) (DRISDOL) 50,000 Units, Oral, Every 7 days   Zepbound 2.5  mg, Subcutaneous, Weekly    Patient Active Problem List   Diagnosis Date Noted   Vitamin D deficiency 12/31/2021   Prediabetes 12/25/2021   OSA (obstructive sleep apnea) 123456   Lichen planopilaris [id number]   Migraine 02/17/2020   Morbid obesity ([place]) 02/01/2018      Review of Systems  All other systems reviewed and are negative.     Objective:     BP 104/80 (BP Location: Left Arm, Patient Position: Sitting, Cuff Size: Large)   Pulse 67   Temp 98.1 F (36.7 C) (Oral)   Ht 5\' 5"  (1.651 m)   Wt 283 lb 4.8 oz (128.5 kg)   SpO2 98%   BMI 47.14 kg/m    Physical Exam Vitals reviewed.  Constitutional:      Appearance: Normal appearance. She is well-groomed. She is morbidly obese.  HENT:     Head: Normocephalic and atraumatic.  Neck:     Thyroid: No thyromegaly.  Cardiovascular:     Rate and Rhythm: Normal rate and regular rhythm.     Heart sounds: S1 normal and S2 normal.  Pulmonary:     Effort: Pulmonary effort is normal.     Breath sounds: Normal breath sounds and air entry.  Musculoskeletal:        General: Normal range of motion.     Right lower leg: No edema.     Left lower  leg: No edema.  Skin:    General: Skin is warm and dry.  Neurological:     Mental Status: She is alert and oriented to person, place, and time. Mental status is at baseline.     Gait: Gait is intact.  Psychiatric:        Mood and Affect: Mood and affect normal.        Speech: Speech normal.        Behavior: Behavior normal.        Judgment: Judgment normal.          The ASCVD Risk score (Arnett DK, et al., 2019) failed to calculate for the following reasons:   The 2019 ASCVD risk score is only valid for ages 51 to 4    Assessment & Plan:   Problem List Items Addressed This Visit       Unprioritized   Morbid obesity (HCC)    Pt is trying to change her diet and hse is regularly being followed by the weight loss center, however her  weight has not changed in the  last 3 months. I will attempt to order her Zepbound 2.5 mg once weekly injections. I will give her ondansetron 4 mg tablets every 6 hours PRN for the nausea side effect.      Relevant Medications   tirzepatide (ZEPBOUND) 2.5 MG/0.5ML Pen   ondansetron (ZOFRAN-ODT) 4 MG disintegrating tablet   Migraine    Pt reports increasing frequency of migraines with the change in weather, states that the sumatriptan causes severe drowsiness so she doesn't like to take this medication. Will start her on amitriptyline 25 mg at bedtime for added control of her migraines.       Relevant Medications   amitriptyline (ELAVIL) 25 MG tablet   Prediabetes - Primary    A1C is better than previous, she will continue her weight loss efforts. I will order her CMP and lipid panel today.       Relevant Orders   POC HgB A1c (Completed)   CMP (Completed)   Lipid Panel (Completed)   Vitamin D deficiency    Currently on 50k unit capsules once weekly, will recheck her level today      Relevant Orders   Vitamin D, 25-hydroxy (Completed)   Other Visit Diagnoses     Vitamin B12 deficiency       Relevant Orders   Vitamin B12 (Completed)       Return in about 6 months (around 10/01/2022) for follow up.    Karie Georges, MD

## 2022-04-01 NOTE — Telephone Encounter (Signed)
Pharmacy Patient Advocate Encounter   Received notification that prior authorization for Zepbound 2.5MG /0.5ML pen-injectors is required/requested.   PA submitted on 04/01/22 to (ins) [employer] via CoverMyMeds Key [id number]  Status is pending

## 2022-04-01 NOTE — Assessment & Plan Note (Signed)
Pt is trying to change her diet and hse is regularly being followed by the weight loss center, however her  weight has not changed in the last 3 months. I will attempt to order her Zepbound 2.5 mg once weekly injections. I will give her ondansetron 4 mg tablets every 6 hours PRN for the nausea side effect.

## 2022-04-01 NOTE — Assessment & Plan Note (Signed)
A1C is better than previous, she will continue her weight loss efforts. I will order her CMP and lipid panel today.

## 2022-04-02 ENCOUNTER — Encounter: Payer: Self-pay | Admitting: Family Medicine

## 2022-04-02 DIAGNOSIS — E538 Deficiency of other specified B group vitamins: Secondary | ICD-10-CM

## 2022-04-02 NOTE — Telephone Encounter (Signed)
ERROR

## 2022-04-08 ENCOUNTER — Other Ambulatory Visit (HOSPITAL_COMMUNITY): Payer: Self-pay

## 2022-04-08 MED ORDER — VITAMIN B-12 1000 MCG PO TABS
1000.0000 ug | ORAL_TABLET | Freq: Every day | ORAL | 0 refills | Status: DC
  Filled 2022-04-08 – 2022-06-20 (×2): qty 90, 90d supply, fill #0

## 2022-04-09 NOTE — Progress Notes (Signed)
Chief Complaint:   OBESITY Debra Parker is here to discuss her progress with her obesity treatment plan along with follow-up of her obesity related diagnoses. Debra Parker is on the Category 3 Plan and states she is following her eating plan approximately 90% of the time. Debra Parker states she is walking at work 80 minutes 5 times per week.  Today's visit was #: 4 Starting weight: 281 lbs Starting date: 01/15/2022 Today's weight: 280 lbs Today's date: 03/25/2022 Total lbs lost to date: 1 lb Total lbs lost since last in-office visit: 0  Interim History: Debra Parker felt somewhat constipated and bloated today. Trying to stick closer to the plan to get all food in. Working on measuring and weighing. Still struggles with getting all food in. Still has to remind herself to eat. Staying local for Christmas.  Subjective:   1. Prediabetes Debra Parker A1c was 6.0 without medication. Not much snack or excess carb intake.  2. Vitamin D deficiency Debra Parker is currently taking prescription Vit D 50,000 IU once a week from PCP. She notes fatigue.  Assessment/Plan:   1. Prediabetes Labs with PCP.  2. Vitamin D deficiency Continue RX Vit D; labs with PCP Dec 27th.  3. Obesity with current BMI of 46.6 Debra Parker is currently in the action stage of change. As such, her goal is to continue with weight loss efforts. She has agreed to the Category 3 Plan.   Exercise goals: All adults should avoid inactivity. Some physical activity is better than none, and adults who participate in any amount of physical activity gain some health benefits.  Behavioral modification strategies: increasing lean protein intake, meal planning and cooking strategies, keeping healthy foods in the home, holiday eating strategies , celebration eating strategies, and planning for success.  Debra Parker has agreed to follow-up with our clinic in 3 weeks. She was informed of the importance of frequent follow-up visits to maximize her success with  intensive lifestyle modifications for her multiple health conditions.   Objective:   Blood pressure 117/78, pulse 85, temperature 98.6 F (37 C), height 5\' 5"  (1.651 m), weight 280 lb (127 kg), SpO2 97 %. Body mass index is 46.59 kg/m.  General: Cooperative, alert, well developed, in no acute distress. HEENT: Conjunctivae and lids unremarkable. Cardiovascular: Regular rhythm.  Lungs: Normal work of breathing. Neurologic: No focal deficits.   Lab Results  Component Value Date   CREATININE 0.73 04/01/2022   BUN 12 04/01/2022   NA 139 04/01/2022   K 3.8 04/01/2022   CL 107 04/01/2022   CO2 23 04/01/2022   Lab Results  Component Value Date   ALT 19 04/01/2022   AST 20 04/01/2022   ALKPHOS 65 04/01/2022   BILITOT 0.4 04/01/2022   Lab Results  Component Value Date   HGBA1C 5.8 (A) 04/01/2022   HGBA1C 6.0 (A) 12/25/2021   HGBA1C 5.8 (A) 06/27/2020   HGBA1C 5.9 (H) 02/20/2020   Lab Results  Component Value Date   INSULIN 27.8 (H) 01/15/2022   Lab Results  Component Value Date   TSH 2.37 02/20/2020   Lab Results  Component Value Date   CHOL 154 04/01/2022   HDL 41.10 04/01/2022   LDLCALC 93 04/01/2022   TRIG 96.0 04/01/2022   CHOLHDL 4 04/01/2022   Lab Results  Component Value Date   VD25OH 18.25 (L) 04/01/2022   VD25OH 14.82 (L) 12/31/2021   VD25OH 13 (L) 02/20/2020   Lab Results  Component Value Date   WBC 7.6 01/15/2022   HGB 13.0  01/15/2022   HCT 41.6 01/15/2022   MCV 87 01/15/2022   PLT 299 01/15/2022   Lab Results  Component Value Date   FERRITIN 41.4 11/30/2018   Attestation Statements:   Reviewed by clinician on day of visit: allergies, medications, problem list, medical history, surgical history, family history, social history, and previous encounter notes.  I, Elnora Morrison, RMA am acting as transcriptionist for Coralie Common, MD.  I have reviewed the above documentation for accuracy and completeness, and I agree with the above. -  Coralie Common, MD

## 2022-04-17 ENCOUNTER — Encounter (INDEPENDENT_AMBULATORY_CARE_PROVIDER_SITE_OTHER): Payer: Self-pay | Admitting: Family Medicine

## 2022-04-17 ENCOUNTER — Ambulatory Visit (INDEPENDENT_AMBULATORY_CARE_PROVIDER_SITE_OTHER): Payer: Commercial Managed Care - PPO | Admitting: Family Medicine

## 2022-04-17 VITALS — BP 122/76 | HR 79 | Temp 98.3°F | Ht 65.0 in | Wt 279.0 lb

## 2022-04-17 DIAGNOSIS — E559 Vitamin D deficiency, unspecified: Secondary | ICD-10-CM | POA: Diagnosis not present

## 2022-04-17 DIAGNOSIS — E538 Deficiency of other specified B group vitamins: Secondary | ICD-10-CM

## 2022-04-17 DIAGNOSIS — Z6841 Body Mass Index (BMI) 40.0 and over, adult: Secondary | ICD-10-CM | POA: Diagnosis not present

## 2022-04-17 DIAGNOSIS — E66813 Obesity, class 3: Secondary | ICD-10-CM

## 2022-04-17 DIAGNOSIS — E669 Obesity, unspecified: Secondary | ICD-10-CM

## 2022-04-17 MED ORDER — BD PEN NEEDLE NANO 2ND GEN 32G X 4 MM MISC
0 refills | Status: DC
  Filled 2022-04-17 – 2022-04-24 (×2): qty 100, 50d supply, fill #0

## 2022-04-17 MED ORDER — SAXENDA 18 MG/3ML ~~LOC~~ SOPN
3.0000 mg | PEN_INJECTOR | Freq: Every day | SUBCUTANEOUS | 0 refills | Status: DC
  Filled 2022-04-17: qty 6, 30d supply, fill #0
  Filled 2022-04-24: qty 15, 30d supply, fill #0

## 2022-04-18 ENCOUNTER — Other Ambulatory Visit (HOSPITAL_COMMUNITY): Payer: Self-pay

## 2022-04-21 ENCOUNTER — Telehealth (INDEPENDENT_AMBULATORY_CARE_PROVIDER_SITE_OTHER): Payer: Self-pay | Admitting: Family Medicine

## 2022-04-21 ENCOUNTER — Encounter (INDEPENDENT_AMBULATORY_CARE_PROVIDER_SITE_OTHER): Payer: Self-pay | Admitting: Family Medicine

## 2022-04-21 NOTE — Telephone Encounter (Signed)
PA has been submitted via CoverMyMeds for Saxenda. Awaiting response from insurance company.

## 2022-04-22 NOTE — Progress Notes (Signed)
Chief Complaint:   OBESITY Debra Parker is here to discuss her progress with her obesity treatment plan along with follow-up of her obesity related diagnoses. Debra Parker is on the Category 3 Plan and states she is following her eating plan approximately 90% of the time. Debra Parker states she is not exercising.  Today's visit was #: 5 Starting weight: 281 lbs Starting date: 01/15/2022 Today's weight: 279 lbs Today's date: 04/17/2022 Total lbs lost to date: 2 Total lbs lost since last in-office visit: 1  Interim History: Debra Parker learned that she struggled with journaling. She tried to log and wasn't as consistent. [name] tried to focus on weighing and measuring food and getting around 94% of her food.  Subjective:   1. Vitamin B12 deficiency Debra Parker's B12 level was low on the labs drawn at her PCP's office. Her last level was 140. She started an OTC B12 supplement.  2. Vitamin D deficiency Debra Parker's last vitamin D level was 18.25, which is only up 4 from her prior lab. She is on prescription vitamin D.  Assessment/Plan:   1. Vitamin B12 deficiency Debra Parker agrees to continue taking OTC vitamin B12. She may consider changing to the injection after the next draw pending that level.  2. Vitamin D deficiency Debra Parker agrees to continue taking her prescription vitamin D. She will consider changing to cholecalciferol if the lab has not increased much.  3. Obesity with current BMI of 46.5 Debra Parker agrees to start [place] with titration given. She will follow up are the agreed upon time.  - Liraglutide -Weight Management (SAXENDA) 18 MG/3ML SOPN; Inject 3 mg into the skin daily.  Dispense: 15 mL; Refill: 0 - Insulin Pen Needle (BD PEN NEEDLE NANO 2ND GEN) 32G X 4 MM MISC; Use as directed 2 (two) times daily.  Dispense: 100 each; Refill: 0  Debra Parker is currently in the action stage of change. As such, her goal is to continue with weight loss efforts. She has agreed to the Category 3 Plan and  keeping a food journal and adhering to recommended goals of 1450 to 1600 calories and 90+ grams of protein.   Exercise goals: No exercise has been prescribed at this time.  Behavioral modification strategies: increasing lean protein intake, meal planning and cooking strategies, keeping healthy foods in the home, and planning for success.  Debra Parker has agreed to follow-up with our clinic in 3 weeks. She was informed of the importance of frequent follow-up visits to maximize her success with intensive lifestyle modifications for her multiple health conditions.   Objective:   Blood pressure 122/76, pulse 79, temperature 98.3 F (36.8 C), height 5\' 5"  (1.651 m), weight 279 lb (126.6 kg), SpO2 99 %. Body mass index is 46.43 kg/m.  General: Cooperative, alert, well developed, in no acute distress. HEENT: Conjunctivae and lids unremarkable. Cardiovascular: Regular rhythm.  Lungs: Normal work of breathing. Neurologic: No focal deficits.   Lab Results  Component Value Date   CREATININE 0.73 04/01/2022   BUN 12 04/01/2022   NA 139 04/01/2022   K 3.8 04/01/2022   CL 107 04/01/2022   CO2 23 04/01/2022   Lab Results  Component Value Date   ALT 19 04/01/2022   AST 20 04/01/2022   ALKPHOS 65 04/01/2022   BILITOT 0.4 04/01/2022   Lab Results  Component Value Date   HGBA1C 5.8 (A) 04/01/2022   HGBA1C 6.0 (A) 12/25/2021   HGBA1C 5.8 (A) 06/27/2020   HGBA1C 5.9 (H) 02/20/2020   Lab Results  Component Value  Date   INSULIN 27.8 (H) 01/15/2022   Lab Results  Component Value Date   TSH 2.37 02/20/2020   Lab Results  Component Value Date   CHOL 154 04/01/2022   HDL 41.10 04/01/2022   LDLCALC 93 04/01/2022   TRIG 96.0 04/01/2022   CHOLHDL 4 04/01/2022   Lab Results  Component Value Date   VD25OH 18.25 (L) 04/01/2022   VD25OH 14.82 (L) 12/31/2021   VD25OH 13 (L) 02/20/2020   Lab Results  Component Value Date   WBC 7.6 01/15/2022   HGB 13.0 01/15/2022   HCT 41.6 01/15/2022    MCV 87 01/15/2022   PLT 299 01/15/2022   Lab Results  Component Value Date   FERRITIN 41.4 11/30/2018   Attestation Statements:   Reviewed by clinician on day of visit: allergies, medications, problem list, medical history, surgical history, family history, social history, and previous encounter notes.  Time spent on visit including pre-visit chart review and post-visit care and charting was 25 minutes.   IMarcille Blanco, CMA, am acting as transcriptionist for Coralie Common, MD I have reviewed the above documentation for accuracy and completeness, and I agree with the above. - Coralie Common, MD

## 2022-04-23 ENCOUNTER — Encounter (INDEPENDENT_AMBULATORY_CARE_PROVIDER_SITE_OTHER): Payer: Self-pay

## 2022-04-24 ENCOUNTER — Ambulatory Visit: Payer: 59 | Admitting: Adult Health

## 2022-04-24 ENCOUNTER — Other Ambulatory Visit (HOSPITAL_COMMUNITY): Payer: Self-pay

## 2022-04-25 NOTE — Telephone Encounter (Signed)
Message from Plan This request has not been approved. Based on the information sent for review, the requested drug did not meet our guideline rules. To get the request approved, your doctor needs to show that you have met the guideline rules below. If you have questions, please call your doctor. In some cases, the requested drug or alternatives offered may have other guideline rules that need to be met. Your provider requested Saxenda pens for weight loss or weight management and for prediabetes (a health condition that means your blood sugar level is higher than normal, but not yet high enough for you to be diagnosed with diabetes). For renewal of Saxenda pens in adult patients needing weight loss or weight management, our guideline named ANTI-OBESITY AGENTS (reviewed for Saxenda) requires that you have lost at least 4% of your baseline body weight after 16 weeks of treatment. Your doctor told us that you have lost 1.8% of your baseline body weight after 16 weeks of treatment.This request has been denied because we did not receive information that you meet the requirement listed above. Please work with your doctor to use a different medication or get [place] more information if it will allow [place] to approve this request. A written notification letter will follow with additional details. This request has been approved. The authorization is effective for a maximum of 4 fills from [id number] to [id number] , as long as the member is enrolled in their current health plan A written notification letter will follow with additional details.

## 2022-05-01 DIAGNOSIS — G4733 Obstructive sleep apnea (adult) (pediatric): Secondary | ICD-10-CM | POA: Diagnosis not present

## 2022-05-05 ENCOUNTER — Encounter (INDEPENDENT_AMBULATORY_CARE_PROVIDER_SITE_OTHER): Payer: Self-pay | Admitting: Family Medicine

## 2022-05-05 ENCOUNTER — Ambulatory Visit (INDEPENDENT_AMBULATORY_CARE_PROVIDER_SITE_OTHER): Payer: Commercial Managed Care - PPO | Admitting: Family Medicine

## 2022-05-05 VITALS — BP 118/73 | HR 91 | Temp 98.2°F | Ht 65.0 in | Wt 274.0 lb

## 2022-05-05 DIAGNOSIS — Z6841 Body Mass Index (BMI) 40.0 and over, adult: Secondary | ICD-10-CM | POA: Diagnosis not present

## 2022-05-05 DIAGNOSIS — R7303 Prediabetes: Secondary | ICD-10-CM

## 2022-05-05 DIAGNOSIS — E559 Vitamin D deficiency, unspecified: Secondary | ICD-10-CM | POA: Diagnosis not present

## 2022-05-05 DIAGNOSIS — E669 Obesity, unspecified: Secondary | ICD-10-CM | POA: Diagnosis not present

## 2022-05-06 ENCOUNTER — Other Ambulatory Visit (HOSPITAL_COMMUNITY): Payer: Self-pay

## 2022-05-08 NOTE — Telephone Encounter (Signed)
Fax received from [facility] stating the request for Zepbound was denied.  Please see more recent message from [facility] with approval for different medication.

## 2022-05-09 ENCOUNTER — Ambulatory Visit: Payer: Commercial Managed Care - PPO | Admitting: Family Medicine

## 2022-05-09 ENCOUNTER — Other Ambulatory Visit (HOSPITAL_COMMUNITY): Payer: Self-pay

## 2022-05-09 VITALS — BP 100/78 | HR 85 | Temp 98.7°F | Ht 65.0 in | Wt 272.8 lb

## 2022-05-09 DIAGNOSIS — K5733 Diverticulitis of large intestine without perforation or abscess with bleeding: Secondary | ICD-10-CM

## 2022-05-09 MED ORDER — METRONIDAZOLE 500 MG PO TABS
500.0000 mg | ORAL_TABLET | Freq: Three times a day (TID) | ORAL | 0 refills | Status: AC
  Filled 2022-05-09: qty 21, 7d supply, fill #0

## 2022-05-09 MED ORDER — CIPROFLOXACIN HCL 500 MG PO TABS
500.0000 mg | ORAL_TABLET | Freq: Two times a day (BID) | ORAL | 0 refills | Status: AC
  Filled 2022-05-09: qty 14, 7d supply, fill #0

## 2022-05-09 MED ORDER — PROMETHAZINE HCL 12.5 MG PO TABS
12.5000 mg | ORAL_TABLET | Freq: Three times a day (TID) | ORAL | 0 refills | Status: DC | PRN
  Filled 2022-05-09: qty 20, 7d supply, fill #0

## 2022-05-09 NOTE — Patient Instructions (Signed)
Start once daily stool softeners-- miralax or colace  After antibiotics are completed, then start Probiotics, up to three times a day with meals to help replenish healthy bacteria

## 2022-05-09 NOTE — Progress Notes (Signed)
Established Patient Office Visit  Subjective   Patient ID: Debra Parker, female    DOB: [birth year]  Age: 37 y.o. MRN: [id number]  Chief Complaint  Patient presents with   Abdominal Pain    Patient complains of mid-abdominal pain and nausea x1 day, tried Zofran with no relief, initially suspected kidney stone due to pain, later constipation  and tried laxative due to back pain 3 weeks ago    Pt reports she started feeling bad before she started the saxenda. States that she thought it was kidney stone initially, started with pain around the right hip in the back, there was a constant pain, states that she was having trouble with bowel movements, so she started taking a stool softener. States that she started having some blood in her stool, states the pain moved to around her lower abdomen and pelvic area. States that she was diagnosed with diverticulitis a couple of years ago and it feels very similar to that episode. States that last night it got very bad, was up all night and she did have episodes of vomiting, states that she took 2 zofran and still is nauseated. No fever or chills, no difficulty urinating, no pain with urination.     Patient Active Problem List   Diagnosis Date Noted   Vitamin D deficiency 12/31/2021   Prediabetes 12/25/2021   OSA (obstructive sleep apnea) 99/83/3825   Lichen planopilaris 05/39/7673   Migraine 02/17/2020   Morbid obesity ([place]) 02/01/2018      Review of Systems  All other systems reviewed and are negative.     Objective:     BP 100/78 (BP Location: Left Arm, Patient Position: Sitting, Cuff Size: Large)   Pulse 85   Temp 98.7 F (37.1 C) (Oral)   Ht 5\' 5"  (1.651 m)   Wt 272 lb 12.8 oz (123.7 kg)   SpO2 98%   BMI 45.40 kg/m    Physical Exam Vitals reviewed.  Constitutional:      Appearance: She is well-developed. She is obese.  Pulmonary:     Effort: Pulmonary effort is normal.  Abdominal:     General: Bowel sounds are  normal.     Palpations: Abdomen is soft.     Tenderness: There is abdominal tenderness in the right lower quadrant and left lower quadrant. There is no guarding or rebound.  Neurological:     Mental Status: She is alert.      No results found for any visits on 05/09/22.    The ASCVD Risk score (Arnett DK, et al., 2019) failed to calculate for the following reasons:   The 2019 ASCVD risk score is only valid for ages 32 to 47    Assessment & Plan:   Problem List Items Addressed This Visit   None Visit Diagnoses     Diverticulitis of large intestine without perforation or abscess with bleeding    -  Primary   Relevant Medications   Patient has had diverticulitis in the past in 2019, seen on CT that was done at the time. She reports that she has been having trouble with constipation for a while, states that this episode is very similar to when she was diagnosed with diverticulitis in 2019. Will treat empirically for acute diverticulitis and start cipro 500 mg BID for 7 days and flagyl 500 mg TID for 7 days. If patient does not improve over the weekend or if her symptoms worsen then I recommend she go to the ER for  immediate evaluation. PRN promethazine for nausea.  ciprofloxacin (CIPRO) 500 MG tablet   metroNIDAZOLE (FLAGYL) 500 MG tablet   promethazine (PHENERGAN) 12.5 MG tablet       No follow-ups on file.    Farrel Conners, MD

## 2022-05-14 ENCOUNTER — Other Ambulatory Visit: Payer: Self-pay

## 2022-05-14 ENCOUNTER — Emergency Department (HOSPITAL_COMMUNITY): Payer: Commercial Managed Care - PPO

## 2022-05-14 ENCOUNTER — Emergency Department (HOSPITAL_COMMUNITY)
Admission: EM | Admit: 2022-05-14 | Discharge: 2022-05-14 | Disposition: A | Discharge status: Home / Self Care | Payer: Commercial Managed Care - PPO | Attending: Emergency Medicine | Admitting: Emergency Medicine

## 2022-05-14 ENCOUNTER — Encounter (HOSPITAL_COMMUNITY): Payer: Self-pay | Admitting: *Deleted

## 2022-05-14 DIAGNOSIS — I10 Essential (primary) hypertension: Secondary | ICD-10-CM | POA: Insufficient documentation

## 2022-05-14 DIAGNOSIS — Z79899 Other long term (current) drug therapy: Secondary | ICD-10-CM | POA: Insufficient documentation

## 2022-05-14 DIAGNOSIS — E876 Hypokalemia: Secondary | ICD-10-CM | POA: Diagnosis not present

## 2022-05-14 DIAGNOSIS — R7401 Elevation of levels of liver transaminase levels: Secondary | ICD-10-CM | POA: Diagnosis not present

## 2022-05-14 DIAGNOSIS — R1011 Right upper quadrant pain: Secondary | ICD-10-CM | POA: Diagnosis not present

## 2022-05-14 DIAGNOSIS — Z794 Long term (current) use of insulin: Secondary | ICD-10-CM | POA: Diagnosis not present

## 2022-05-14 DIAGNOSIS — K5730 Diverticulosis of large intestine without perforation or abscess without bleeding: Secondary | ICD-10-CM | POA: Diagnosis not present

## 2022-05-14 DIAGNOSIS — R1013 Epigastric pain: Secondary | ICD-10-CM | POA: Diagnosis not present

## 2022-05-14 DIAGNOSIS — N200 Calculus of kidney: Secondary | ICD-10-CM | POA: Diagnosis not present

## 2022-05-14 LAB — COMPREHENSIVE METABOLIC PANEL
ALT: 64 U/L — ABNORMAL HIGH (ref 0–44)
AST: 63 U/L — ABNORMAL HIGH (ref 15–41)
Albumin: 3.9 g/dL (ref 3.5–5.0)
Alkaline Phosphatase: 70 U/L (ref 38–126)
Anion gap: 10 (ref 5–15)
BUN: 10 mg/dL (ref 6–20)
CO2: 25 mmol/L (ref 22–32)
Calcium: 8.5 mg/dL — ABNORMAL LOW (ref 8.9–10.3)
Chloride: 102 mmol/L (ref 98–111)
Creatinine, Ser: 0.79 mg/dL (ref 0.44–1.00)
GFR, Estimated: >60 mL/min (ref >60)
Glucose, Bld: 84 mg/dL (ref 70–99)
Potassium: 2.9 mmol/L — ABNORMAL LOW (ref 3.5–5.1)
Sodium: 137 mmol/L (ref 135–145)
Total Bilirubin: 0.4 mg/dL (ref 0.3–1.2)
Total Protein: 7.6 g/dL (ref 6.5–8.1)

## 2022-05-14 LAB — URINALYSIS, ROUTINE W REFLEX MICROSCOPIC
Bilirubin Urine: NEGATIVE
Glucose, UA: NEGATIVE mg/dL
Hgb urine dipstick: NEGATIVE
Ketones, ur: NEGATIVE mg/dL
Nitrite: NEGATIVE
Protein, ur: NEGATIVE mg/dL
Specific Gravity, Urine: 1.023 (ref 1.005–1.030)
pH: 5 (ref 5.0–8.0)

## 2022-05-14 LAB — CBC
HCT: 38.4 % (ref 36.0–46.0)
Hemoglobin: 12.5 g/dL (ref 12.0–15.0)
MCH: 28.3 pg (ref 26.0–34.0)
MCHC: 32.6 g/dL (ref 30.0–36.0)
MCV: 86.9 fL (ref 80.0–100.0)
Platelets: 266 10*3/uL (ref 150–400)
RBC: 4.42 MIL/uL (ref 3.87–5.11)
RDW: 13.1 % (ref 11.5–15.5)
WBC: 11.3 10*3/uL — ABNORMAL HIGH (ref 4.0–10.5)
nRBC: 0 % (ref 0.0–0.2)

## 2022-05-14 LAB — POC URINE PREG, ED: Preg Test, Ur: NEGATIVE

## 2022-05-14 LAB — LIPASE, BLOOD: Lipase: 34 U/L (ref 11–51)

## 2022-05-14 MED ORDER — IOHEXOL 300 MG/ML  SOLN
100.0000 mL | Freq: Once | INTRAMUSCULAR | Status: AC | PRN
  Administered 2022-05-14: 100 mL via INTRAVENOUS

## 2022-05-14 MED ORDER — POTASSIUM CHLORIDE CRYS ER 20 MEQ PO TBCR
40.0000 meq | EXTENDED_RELEASE_TABLET | Freq: Once | ORAL | Status: AC
  Administered 2022-05-14: 40 meq via ORAL
  Filled 2022-05-14: qty 2

## 2022-05-14 MED ORDER — ONDANSETRON HCL 4 MG/2ML IJ SOLN
4.0000 mg | Freq: Once | INTRAMUSCULAR | Status: AC
  Administered 2022-05-14: 4 mg via INTRAVENOUS
  Filled 2022-05-14: qty 2

## 2022-05-14 MED ORDER — POTASSIUM CHLORIDE CRYS ER 20 MEQ PO TBCR
20.0000 meq | EXTENDED_RELEASE_TABLET | Freq: Every day | ORAL | 0 refills | Status: DC
  Filled 2022-05-14: qty 2, 2d supply, fill #0

## 2022-05-14 MED ORDER — ONDANSETRON 4 MG PO TBDP
4.0000 mg | ORAL_TABLET | Freq: Three times a day (TID) | ORAL | 0 refills | Status: DC | PRN
  Filled 2022-05-14 – 2022-05-15 (×2): qty 20, 7d supply, fill #0

## 2022-05-14 MED ORDER — ONDANSETRON 4 MG PO TBDP: 4.0000 mg | ORAL_TABLET | Freq: Once | ORAL | Status: DC

## 2022-05-14 NOTE — ED Provider Notes (Signed)
[facility] Provider Note   CSN: [id number] Arrival date & time: 05/14/22  1720     History  Chief Complaint  Patient presents with   Abdominal Pain    Debra Parker is a 37 y.o. female with migraines, OSA, morbid obesity who presents with abd pain, N/V.   Per chart review patient was seen by her primary care physician on 05/09/2022 for mid abdominal pain and nausea with 1 day.  Zofran did not help.  Tried laxatives which did not work.  Felt similar to prior diverticulitis.  Based on that information without a CT scan, PCP treated empirically for diverticulitis with Cipro and Flagyl.  Also gave promethazine for nausea. Pt is currently taking antibiotics but still feels nauseated. Vomited once last night. Thought she was improving but then last night per pain worsened to 5/10. Located epigastric/central abdominal region. Feels like she needs to have a BM but hasn't had a "good one" x 2 days.  No dysuria/hematuria, vaginal symptoms. Had a lipoma removed from abdomen, as well as a C/S.    Abdominal Pain      Home Medications Prior to Admission medications   Medication Sig Start Date End Date Taking? Authorizing Provider  acetaminophen (TYLENOL) 500 MG tablet Take 500 mg by mouth every 6 (six) hours as needed for mild pain.   Yes [provider]  albuterol (VENTOLIN HFA) 108 (90 Base) MCG/ACT inhaler Inhale 1-2 puffs into the lungs every 6 (six) hours as needed for wheezing or shortness of breath. Patient taking differently: Inhale 2 puffs into the lungs every 6 (six) hours as needed for wheezing or shortness of breath. 12/06/21  Yes Volney American, PA-C  amitriptyline (ELAVIL) 25 MG tablet Take 1 tablet (25 mg total) by mouth at bedtime. 04/01/22  Yes Farrel Conners, MD  ciprofloxacin (CIPRO) 500 MG tablet Take 1 tablet (500 mg total) by mouth 2 (two) times daily for 7 days. 05/09/22 05/16/22 Yes Farrel Conners, MD   cyanocobalamin (VITAMIN B12) 1000 MCG tablet Take 1 tablet (1,000 mcg total) by mouth daily. 04/08/22  Yes Farrel Conners, MD  levonorgestrel (MIRENA, 52 MG,) 20 MCG/24HR IUD 1 Intra Uterine Device (1 each total) by Intrauterine route once for 1 dose. 11/10/18 05/14/22 Yes Koberlein, Steele Berg, MD  Liraglutide -Weight Management (SAXENDA) 18 MG/3ML SOPN Inject 3 mg into the skin daily. 04/17/22  Yes Laqueta Linden, MD  Magnesium Hydroxide (DULCOLAX PO) Take 1 tablet by mouth daily.   Yes [provider]  metroNIDAZOLE (FLAGYL) 500 MG tablet Take 1 tablet (500 mg total) by mouth 3 (three) times daily for 7 days. 05/09/22 05/16/22 Yes Farrel Conners, MD  ondansetron (ZOFRAN-ODT) 4 MG disintegrating tablet Take 1 tablet (4 mg total) by mouth every 8 (eight) hours as needed for nausea or vomiting. 05/14/22  Yes Audley Hose, MD  potassium chloride SA (KLOR-CON M) 20 MEQ tablet Take 1 tablet (20 mEq total) by mouth daily for 2 days. 05/14/22 05/16/22 Yes Audley Hose, MD  prenatal vitamin w/FE, FA (PRENATAL 1 + 1) 27-1 MG TABS tablet Take 1 tablet by mouth daily.   Yes [provider]  promethazine (PHENERGAN) 12.5 MG tablet Take 1 tablet (12.5 mg total) by mouth every 8 (eight) hours as needed for nausea or vomiting. 05/09/22  Yes Farrel Conners, MD  topiramate (TOPAMAX) 50 MG tablet Take 3 tablets (150 mg total) by mouth 2 (two) times  daily in the morning and at bedtime 12/25/21 12/25/22 Yes Farrel Conners, MD  Vitamin D, Ergocalciferol, (DRISDOL) 1.25 MG (50000 UNIT) CAPS capsule Take 1 capsule (50,000 Units total) by mouth every 7 (seven) days. 01/01/22  Yes Farrel Conners, MD  Insulin Pen Needle (BD PEN NEEDLE NANO 2ND GEN) 32G X 4 MM MISC Use as directed 2 (two) times daily. 04/17/22   Laqueta Linden, MD      Allergies    Aimovig [erenumab-aooe] and Sulfa antibiotics    Review of Systems   Review of Systems  Gastrointestinal:  Positive for abdominal pain.    Review of systems Negative for f/c.  A 10 point review of systems was performed and is negative unless otherwise reported in HPI.  Physical Exam Updated Vital Signs BP 105/85   Pulse 69   Temp 98.6 F (37 C) (Oral)   Resp 16   Ht 5\' 5"  (1.651 m)   Wt 123.4 kg   SpO2 100%   BMI 45.26 kg/m  Physical Exam General: Normal appearing female, lying in bed.  HEENT: Sclera anicteric, MMM, trachea midline.  Cardiology: RRR, no murmurs/rubs/gallops. BL radial and DP pulses equal bilaterally.  Resp: Normal respiratory rate and effort. CTAB, no wheezes, rhonchi, crackles.  Abd: +Epigastric/RUQ TTP. Soft, non-distended. No rebound tenderness or guarding.  GU: Deferred. MSK: No peripheral edema or signs of trauma. Extremities without deformity or TTP. No cyanosis or clubbing. Skin: warm, dry. No rashes or lesions. Back: No CVA tenderness Neuro: A&Ox4, CNs II-XII grossly intact. MAEs. Sensation grossly intact.  Psych: Normal mood and affect.   ED Results / Procedures / Treatments   Labs (all labs ordered are listed, but only abnormal results are displayed) Labs Reviewed  COMPREHENSIVE METABOLIC PANEL - Abnormal; Notable for the following components:      Result Value   Potassium 2.9 (*)    Calcium 8.5 (*)    AST 63 (*)    ALT 64 (*)    All other components within normal limits  CBC - Abnormal; Notable for the following components:   WBC 11.3 (*)    All other components within normal limits  URINALYSIS, ROUTINE W REFLEX MICROSCOPIC - Abnormal; Notable for the following components:   Leukocytes,Ua SMALL (*)    Bacteria, UA RARE (*)    All other components within normal limits  LIPASE, BLOOD  POC URINE PREG, ED    EKG None  Radiology CT ABDOMEN PELVIS W CONTRAST  Result Date: 05/14/2022 CLINICAL DATA:  Epigastric and right upper quadrant pain EXAM: CT ABDOMEN AND PELVIS WITH CONTRAST TECHNIQUE: Multidetector CT imaging of the abdomen and pelvis was performed using the standard  protocol following bolus administration of intravenous contrast. RADIATION DOSE REDUCTION: This exam was performed according to the departmental dose-optimization program which includes automated exposure control, adjustment of the mA and/or kV according to patient size and/or use of iterative reconstruction technique. CONTRAST:  140mL OMNIPAQUE IOHEXOL 300 MG/ML  SOLN COMPARISON:  10/18/2017 FINDINGS: Lower chest: No acute abnormality Hepatobiliary: No focal hepatic abnormality. Gallbladder unremarkable. Pancreas: No focal abnormality or ductal dilatation. Spleen: No focal abnormality.  Normal size. Adrenals/Urinary Tract: 2 cm right adrenal nodule with central fat density compatible with adenoma or myelolipoma left adrenal gland unremarkable. Punctate nonobstructing stone in the upper pole of the left kidney. No ureteral stones or hydronephrosis. Urinary bladder unremarkable. Stomach/Bowel: Scattered colonic diverticulosis. No active diverticulitis. Stomach and small bowel decompressed, unremarkable. Index normal. Vascular/Lymphatic: No evidence of aneurysm  or adenopathy. Reproductive: Uterus and adnexa unremarkable. No mass. IUD in the uterus. Other: No free fluid or free air. Musculoskeletal: No acute bony abnormality. IMPRESSION: No acute findings in the abdomen or pelvis. Punctate left upper pole nephrolithiasis. Electronically Signed   By: Rolm Baptise M.D.   On: 05/14/2022 22:58    Procedures Procedures    Medications Ordered in ED Medications  potassium chloride SA (KLOR-CON M) CR tablet 40 mEq (40 mEq Oral Given 05/14/22 2106)  ondansetron (ZOFRAN) injection 4 mg (4 mg Intravenous Given 05/14/22 2107)  iohexol (OMNIPAQUE) 300 MG/ML solution 100 mL (100 mLs Intravenous Contrast Given 05/14/22 2239)    ED Course/ Medical Decision Making/ A&P                          Medical Decision Making Amount and/or Complexity of Data Reviewed Labs: ordered. Decision-making details documented in ED  Course. Radiology: ordered. Decision-making details documented in ED Course.  Risk Prescription drug management.    This patient presents to the ED for concern of abd pain; this involves an extensive number of treatment options, and is a complaint that carries with it a high risk of complications and morbidity.  I considered the following differential and admission for this acute, potentially life threatening condition.   MDM:    For DDX for abdominal pain includes but is not limited to:  Abdominal exam without peritoneal signs. No evidence of acute abdomen at this time.  Patient with epigastric and right upper quadrant pain that she states has been intermittent but sometimes constant which is concerning for pancreatitis, ileocolic such as cholelithiasis or cholecystitis though no fever.  She states that it used to feel like her prior episodes of diverticulitis but has changed in nature.  She has no left lower quadrant or right lower quadrant pain where she states her prior to reticulitis was located to indicate current diverticulitis.  Additionally the outpatient antibiotics are not working.  If she does have diverticulitis, would be failure of outpatient treatment.  Low suspicion for acute appendicitis, vascular catastrophe, bowel obstruction, viscus perforation, or ovarian torsion.  No urinary symptoms to indicate UTI or pyelonephritis and negative CVA tenderness. Considered obtaining a right upper quadrant ultrasound however ultrasound not available at this hospital at this time.  Will obtain CT abdomen pelvis to further investigate.   Clinical Course as of 05/14/22 2336  Wed May 14, 2022  2055 Preg Test, Ur: Negative [HN]  2055 Lipase: 34 [HN]  2055 WBC(!): 11.3 [HN]  2055 Potassium(!): 2.9 Will replete [HN]  2130 Urinalysis, Routine w reflex microscopic -Urine, Clean Catch(!) Neg for UTI [HN]  2225 AST(!): 63 [HN]  2225 ALT(!): 64 Mild elevations in AST/ALT, no h/o similar, could be  consistent with biliary colic. Tbili wnl. [HN]  2304 CT ABDOMEN PELVIS W CONTRAST IMPRESSION: No acute findings in the abdomen or pelvis.  Punctate left upper pole nephrolithiasis.   [HN]  2333 Patient reevaluated.  Her CT scan was unremarkable.  Gallbladder was also unremarkable on the CT scan.  I discussed with patient her negative results patient is feeling better right now.  She has no fever tachycardia indicate serious infection or sepsis.  She has mild leukocytosis and hypokalemia.  Patient is stable to follow-up outpatient.  I discussed with patient that she will need to have her labs rechecked including potassium with her primary care physician.  I discussed the possibility of cholelithiasis not noted on the CT  abdomen pelvis patient reports understanding.  Patient will be discharged with Zofran ODT Rx, instructions to have good p.o. intake, follow-up with PCP within the next week.  If her abdominal pain gets worse she needs to come back.  Given discharge instructions and return precautions, all questions answered to patient satisfaction. [HN]    Clinical Course User Index [HN] Loetta Rough, MD    Labs: I Ordered, and personally interpreted labs.  The pertinent results include:  those listed above  Imaging Studies ordered: I ordered imaging studies including CT abd pelvis w/ contrast I independently visualized and interpreted imaging. I agree with the radiologist interpretation  Additional history obtained from chart review, mother at bedside.    Cardiac Monitoring: The patient was maintained on a cardiac monitor.  I personally viewed and interpreted the cardiac monitored which showed an underlying rhythm of: NSR  Reevaluation: After the interventions noted above, I reevaluated the patient and found that they have :improved  Social Determinants of Health: Patient lives independently   Disposition:  DC  Co morbidities that complicate the patient evaluation  Past  Medical History:  Diagnosis Date   Bronchitis    Diverticulitis 2019   Hypertension    when pregnant   Lichen planopilaris    Migraine    Migraines    Obesity    OSA (obstructive sleep apnea)    Prediabetes    Sleep apnea    Vitamin D deficiency      Medicines Meds ordered this encounter  Medications   potassium chloride SA (KLOR-CON M) CR tablet 40 mEq   DISCONTD: ondansetron (ZOFRAN-ODT) disintegrating tablet 4 mg   ondansetron (ZOFRAN) injection 4 mg   iohexol (OMNIPAQUE) 300 MG/ML solution 100 mL   ondansetron (ZOFRAN-ODT) 4 MG disintegrating tablet    Sig: Take 1 tablet (4 mg total) by mouth every 8 (eight) hours as needed for nausea or vomiting.    Dispense:  20 tablet    Refill:  0   potassium chloride SA (KLOR-CON M) 20 MEQ tablet    Sig: Take 1 tablet (20 mEq total) by mouth daily for 2 days.    Dispense:  2 tablet    Refill:  0    I have reviewed the patients home medicines and have made adjustments as needed  Problem List / ED Course: Problem List Items Addressed This Visit   None Visit Diagnoses     Epigastric pain    -  Primary   Hypokalemia       Transaminitis                       This note was created using dictation software, which may contain spelling or grammatical errors.    Loetta Rough, MD 05/14/22 [phone]

## 2022-05-14 NOTE — ED Triage Notes (Signed)
Pt has been recently treated for suspected diverticulitis due to constant mid abdominal pain. Pt is currently taking antibiotics but still feels nauseated. Vomited once last night. Pt was told to come to the ED if she wasn't feeling any better. Feels like she needs to have a BM but hasn't had a "good one" x 2 days.

## 2022-05-14 NOTE — Progress Notes (Signed)
Chief Complaint:   OBESITY Debra Parker is here to discuss her progress with her obesity treatment plan along with follow-up of her obesity related diagnoses. Debra Parker is on keeping a food journal and adhering to recommended goals of 1540-1600 calories and 90+ grams of protein and states she is following her eating plan approximately 0% of the time. Debra Parker states she is exercising 0 minutes 0 times per week.  Today's visit was #: 6 Starting weight: 281 lbs Starting date: 01/15/2022 Today's weight: 274 lbs Today's date: 05/05/2022 Total lbs lost to date: 7 lbs Total lbs lost since last in-office visit: 5  Interim History: Debra Parker did not journal much so to speak and realizes she has to go back to using the app.  Has protein shakes now to help with getting protein goal.  Has been able to titrate up to 5 clicks past 0.6 mg.  She is noticing she is getting significantly less and  Subjective:   1. Vitamin D deficiency Debra Parker is currently taking prescription Vit D 50,000 IU once a week.  Last Vit D level low at 18.  Denies any nausea, vomiting or muscle weakness.  She notes fatigue.  2. Prediabetes Last A1c still elevated.  On GLP-1.  Assessment/Plan:   1. Vitamin D deficiency Continue vitamin D 50 K IUs once a week.  2. Prediabetes Will repeat labs in 3 months.  3. BMI 45.0-49.9, adult (HCC) Debra Parker is currently in the action stage of change. As such, her goal is to continue with weight loss efforts. She has agreed to keeping a food journal and adhering to recommended goals of 1450-1600 calories and 90+ grams of protein daily.   Exercise goals: No exercise has been prescribed at this time.  Debra Parker is to start making plans for physical activity.  Behavioral modification strategies: increasing lean protein intake, meal planning and cooking strategies, keeping healthy foods in the home, and planning for success.  Debra Parker has agreed to follow-up with our clinic in 3 weeks. She was  informed of the importance of frequent follow-up visits to maximize her success with intensive lifestyle modifications for her multiple health conditions.   Objective:   Blood pressure 118/73, pulse 91, temperature 98.2 F (36.8 C), height 5' 5"$  (1.651 m), weight 274 lb (124.3 kg), SpO2 100 %. Body mass index is 45.6 kg/m.  General: Cooperative, alert, well developed, in no acute distress. HEENT: Conjunctivae and lids unremarkable. Cardiovascular: Regular rhythm.  Lungs: Normal work of breathing. Neurologic: No focal deficits.   Lab Results  Component Value Date   CREATININE 0.73 04/01/2022   BUN 12 04/01/2022   NA 139 04/01/2022   K 3.8 04/01/2022   CL 107 04/01/2022   CO2 23 04/01/2022   Lab Results  Component Value Date   ALT 19 04/01/2022   AST 20 04/01/2022   ALKPHOS 65 04/01/2022   BILITOT 0.4 04/01/2022   Lab Results  Component Value Date   HGBA1C 5.8 (A) 04/01/2022   HGBA1C 6.0 (A) 12/25/2021   HGBA1C 5.8 (A) 06/27/2020   HGBA1C 5.9 (H) 02/20/2020   Lab Results  Component Value Date   INSULIN 27.8 (H) 01/15/2022   Lab Results  Component Value Date   TSH 2.37 02/20/2020   Lab Results  Component Value Date   CHOL 154 04/01/2022   HDL 41.10 04/01/2022   LDLCALC 93 04/01/2022   TRIG 96.0 04/01/2022   CHOLHDL 4 04/01/2022   Lab Results  Component Value Date   VD25OH 18.25 (  L) 04/01/2022   VD25OH 14.82 (L) 12/31/2021   VD25OH 13 (L) 02/20/2020   Lab Results  Component Value Date   WBC 7.6 01/15/2022   HGB 13.0 01/15/2022   HCT 41.6 01/15/2022   MCV 87 01/15/2022   PLT 299 01/15/2022   Lab Results  Component Value Date   FERRITIN 41.4 11/30/2018   Attestation Statements:   Reviewed by clinician on day of visit: allergies, medications, problem list, medical history, surgical history, family history, social history, and previous encounter notes.  I, Elnora Morrison, RMA am acting as transcriptionist for Coralie Common, MD. I have reviewed  the above documentation for accuracy and completeness, and I agree with the above. - Coralie Common, MD

## 2022-05-14 NOTE — Discharge Instructions (Addendum)
Thank you for coming to [facility] Emergency Department. You were seen for nausea and abdominal pain. We did an exam, labs, and imaging, and these showed mildly elevated liver function tests as well as low potassium.  We will prescribe Zofran under the tongue to take as needed every 8 hours for nausea and vomiting as well as 2 days worth of potassium.  Please eat and drink well. Please follow up with your primary care provider within 1 week.  You will need to have your labs rechecked including your potassium.  Do not hesitate to return to the ED or call 911 if you experience: -Worsening symptoms -Nausea/vomiting so severe you cannot eat/drink anything -Lightheadedness, passing out -Fevers/chills -Anything else that concerns you

## 2022-05-15 ENCOUNTER — Encounter: Payer: Self-pay | Admitting: Family Medicine

## 2022-05-15 ENCOUNTER — Other Ambulatory Visit (HOSPITAL_COMMUNITY): Payer: Self-pay

## 2022-05-19 ENCOUNTER — Emergency Department (HOSPITAL_COMMUNITY): Payer: Commercial Managed Care - PPO

## 2022-05-19 ENCOUNTER — Other Ambulatory Visit: Payer: Self-pay

## 2022-05-19 ENCOUNTER — Encounter (HOSPITAL_COMMUNITY): Payer: Self-pay | Admitting: Emergency Medicine

## 2022-05-19 ENCOUNTER — Emergency Department (HOSPITAL_COMMUNITY)
Admission: EM | Admit: 2022-05-19 | Discharge: 2022-05-19 | Disposition: A | Discharge status: Home / Self Care | Payer: Commercial Managed Care - PPO | Attending: Emergency Medicine | Admitting: Emergency Medicine

## 2022-05-19 ENCOUNTER — Inpatient Hospital Stay: Payer: Commercial Managed Care - PPO | Admitting: Family Medicine

## 2022-05-19 DIAGNOSIS — R112 Nausea with vomiting, unspecified: Secondary | ICD-10-CM | POA: Diagnosis not present

## 2022-05-19 DIAGNOSIS — R1011 Right upper quadrant pain: Secondary | ICD-10-CM | POA: Diagnosis not present

## 2022-05-19 DIAGNOSIS — R1031 Right lower quadrant pain: Secondary | ICD-10-CM | POA: Diagnosis not present

## 2022-05-19 DIAGNOSIS — K760 Fatty (change of) liver, not elsewhere classified: Secondary | ICD-10-CM | POA: Diagnosis not present

## 2022-05-19 LAB — COMPREHENSIVE METABOLIC PANEL
ALT: 60 U/L — ABNORMAL HIGH (ref 0–44)
AST: 53 U/L — ABNORMAL HIGH (ref 15–41)
Albumin: 3.4 g/dL — ABNORMAL LOW (ref 3.5–5.0)
Alkaline Phosphatase: 53 U/L (ref 38–126)
Anion gap: 7 (ref 5–15)
BUN: 8 mg/dL (ref 6–20)
CO2: 24 mmol/L (ref 22–32)
Calcium: 8.3 mg/dL — ABNORMAL LOW (ref 8.9–10.3)
Chloride: 105 mmol/L (ref 98–111)
Creatinine, Ser: 0.64 mg/dL (ref 0.44–1.00)
GFR, Estimated: >60 mL/min (ref >60)
Glucose, Bld: 100 mg/dL — ABNORMAL HIGH (ref 70–99)
Potassium: 3.8 mmol/L (ref 3.5–5.1)
Sodium: 136 mmol/L (ref 135–145)
Total Bilirubin: 0.4 mg/dL (ref 0.3–1.2)
Total Protein: 6.8 g/dL (ref 6.5–8.1)

## 2022-05-19 LAB — URINALYSIS, ROUTINE W REFLEX MICROSCOPIC
Bilirubin Urine: NEGATIVE
Glucose, UA: NEGATIVE mg/dL
Ketones, ur: NEGATIVE mg/dL
Leukocytes,Ua: NEGATIVE
Nitrite: NEGATIVE
Protein, ur: NEGATIVE mg/dL
Specific Gravity, Urine: 1.009 (ref 1.005–1.030)
pH: 6 (ref 5.0–8.0)

## 2022-05-19 LAB — CBC WITH DIFFERENTIAL/PLATELET
Abs Immature Granulocytes: 0.03 10*3/uL (ref 0.00–0.07)
Basophils Absolute: 0.1 10*3/uL (ref 0.0–0.1)
Basophils Relative: 1 %
Eosinophils Absolute: 0.3 10*3/uL (ref 0.0–0.5)
Eosinophils Relative: 3 %
HCT: 38.2 % (ref 36.0–46.0)
Hemoglobin: 12.5 g/dL (ref 12.0–15.0)
Immature Granulocytes: 0 %
Lymphocytes Relative: 33 %
Lymphs Abs: 3.1 10*3/uL (ref 0.7–4.0)
MCH: 28.3 pg (ref 26.0–34.0)
MCHC: 32.7 g/dL (ref 30.0–36.0)
MCV: 86.6 fL (ref 80.0–100.0)
Monocytes Absolute: 0.5 10*3/uL (ref 0.1–1.0)
Monocytes Relative: 5 %
Neutro Abs: 5.6 10*3/uL (ref 1.7–7.7)
Neutrophils Relative %: 58 %
Platelets: 250 10*3/uL (ref 150–400)
RBC: 4.41 MIL/uL (ref 3.87–5.11)
RDW: 13.2 % (ref 11.5–15.5)
WBC: 9.6 10*3/uL (ref 4.0–10.5)
nRBC: 0 % (ref 0.0–0.2)

## 2022-05-19 LAB — LIPASE, BLOOD: Lipase: 38 U/L (ref 11–51)

## 2022-05-19 LAB — POC URINE PREG, ED: Preg Test, Ur: NEGATIVE

## 2022-05-19 MED ORDER — ONDANSETRON HCL 4 MG/2ML IJ SOLN
4.0000 mg | Freq: Once | INTRAMUSCULAR | Status: AC
  Administered 2022-05-19: 4 mg via INTRAVENOUS
  Filled 2022-05-19: qty 2

## 2022-05-19 MED ORDER — METOCLOPRAMIDE HCL 10 MG PO TABS: 10.0000 mg | ORAL_TABLET | Freq: Four times a day (QID) | ORAL | 0 refills | Status: DC

## 2022-05-19 MED ORDER — MORPHINE SULFATE (PF) 4 MG/ML IV SOLN
4.0000 mg | INTRAVENOUS | Status: AC
  Administered 2022-05-19: 4 mg via INTRAVENOUS
  Filled 2022-05-19: qty 1

## 2022-05-19 MED ORDER — METOCLOPRAMIDE HCL 10 MG PO TABS
10.0000 mg | ORAL_TABLET | Freq: Once | ORAL | Status: AC
  Administered 2022-05-19: 10 mg via ORAL
  Filled 2022-05-19: qty 1

## 2022-05-19 NOTE — ED Notes (Signed)
Pt given water and crackers for PO challenge

## 2022-05-19 NOTE — Discharge Instructions (Signed)
You were seen for your abdominal pain in the emergency department.   At home, please take the Reglan we have prescribed you for any nausea or vomiting.    Follow-up with your primary doctor in 2-3 days regarding your visit.  Follow-up with a gastroenterologist soon as possible.  Return immediately to the emergency department if you experience any of the following: Fevers, vomiting despite the nausea medication, or any other concerning symptoms.    Thank you for visiting our Emergency Department. It was a pleasure taking care of you today.

## 2022-05-19 NOTE — ED Notes (Signed)
Pt ambulatory to the restroom without difficulty. Gait steady and even.  

## 2022-05-19 NOTE — ED Notes (Signed)
Ultrasound at bedside at this time.

## 2022-05-19 NOTE — ED Provider Notes (Signed)
[facility] Provider Note   CSN: [id number] Arrival date & time: 05/19/22  0703     History {Add pertinent medical, surgical, social history, OB history to HPI:1} Chief Complaint  Patient presents with   Abdominal Pain    Debra Parker is a 37 y.o. female.  37 year old female with a history of C-section and abdominal lipoma removal who presents emergency department with nausea and vomiting.  Patient reports that she has had nausea vomiting and abdominal pain for the past 3 weeks.  Was seen in the emergency department on 05/14/2022 and had a CT scan that did not show any acute abnormality including appendicitis.  Did have mildly elevated LFTs of unclear etiology so they were considering a right upper quadrant ultrasound but did not have staff available to perform at that time.  Patient was discharged home and has had persistent nausea and vomiting despite taking ODT Zofran.  Says it is nonbloody and nonbilious.  Not every day.  Denies any diarrhea.  Says that her abdominal pain is mostly in the mid to right lower quadrant.  Denies any dysuria, frequency, vaginal bleeding or discharge.  Says that the pain is exacerbated by movement, eating, and having bowel movements.  Of note, was treated prior to being evaluated in the emergency department with p.o. antibiotics for suspected diverticulitis.       Home Medications Prior to Admission medications   Medication Sig Start Date End Date Taking? Authorizing Provider  acetaminophen (TYLENOL) 500 MG tablet Take 500 mg by mouth every 6 (six) hours as needed for mild pain.   Yes [provider]  albuterol (VENTOLIN HFA) 108 (90 Base) MCG/ACT inhaler Inhale 1-2 puffs into the lungs every 6 (six) hours as needed for wheezing or shortness of breath. Patient taking differently: Inhale 2 puffs into the lungs every 6 (six) hours as needed for wheezing or shortness of breath. 12/06/21  Yes Volney American, PA-C  amitriptyline (ELAVIL) 25 MG tablet Take 1 tablet (25 mg total) by mouth at bedtime. 04/01/22  Yes Farrel Conners, MD  cyanocobalamin (VITAMIN B12) 1000 MCG tablet Take 1 tablet (1,000 mcg total) by mouth daily. 04/08/22  Yes Farrel Conners, MD  Liraglutide -Weight Management (SAXENDA) 18 MG/3ML SOPN Inject 3 mg into the skin daily. 04/17/22  Yes Laqueta Linden, MD  Magnesium Hydroxide (DULCOLAX PO) Take 1 tablet by mouth daily.   Yes [provider]  ondansetron (ZOFRAN-ODT) 4 MG disintegrating tablet Dissolve 1 tablet (4 mg total) in mouth every 8 (eight) hours as needed for nausea or vomiting. 05/14/22  Yes Audley Hose, MD  prenatal vitamin w/FE, FA (PRENATAL 1 + 1) 27-1 MG TABS tablet Take 1 tablet by mouth daily.   Yes [provider]  promethazine (PHENERGAN) 12.5 MG tablet Take 1 tablet (12.5 mg total) by mouth every 8 (eight) hours as needed for nausea or vomiting. 05/09/22  Yes Farrel Conners, MD  topiramate (TOPAMAX) 50 MG tablet Take 3 tablets (150 mg total) by mouth 2 (two) times daily in the morning and at bedtime 12/25/21 12/25/22 Yes Farrel Conners, MD  Vitamin D, Ergocalciferol, (DRISDOL) 1.25 MG (50000 UNIT) CAPS capsule Take 1 capsule (50,000 Units total) by mouth every 7 (seven) days. 01/01/22  Yes Farrel Conners, MD  Insulin Pen Needle (BD PEN NEEDLE NANO 2ND GEN) 32G X 4 MM MISC Use as directed 2 (two) times daily. 04/17/22   Coralie Common  U, MD  levonorgestrel (MIRENA, 52 MG,) 20 MCG/24HR IUD 1 Intra Uterine Device (1 each total) by Intrauterine route once for 1 dose. 11/10/18 05/14/22  Caren Macadam, MD  potassium chloride SA (KLOR-CON M) 20 MEQ tablet Take 1 tablet (20 mEq total) by mouth daily for 2 days. 05/14/22 05/17/22  Audley Hose, MD      Allergies    Eduard Roux and Sulfa antibiotics    Review of Systems   Review of Systems  Physical Exam Updated Vital Signs BP 120/74 (BP Location: Left Arm)    Pulse 79   Temp 98.2 F (36.8 C) (Oral)   Resp 19   Ht 5' 5"$  (1.651 m)   Wt 123.4 kg   SpO2 97%   BMI 45.26 kg/m  Physical Exam Vitals and nursing note reviewed.  Constitutional:      General: She is not in acute distress.    Appearance: She is well-developed.  HENT:     Head: Normocephalic and atraumatic.     Right Ear: External ear normal.     Left Ear: External ear normal.     Nose: Nose normal.  Eyes:     Extraocular Movements: Extraocular movements intact.     Conjunctiva/sclera: Conjunctivae normal.     Pupils: Pupils are equal, round, and reactive to light.  Cardiovascular:     Rate and Rhythm: Normal rate and regular rhythm.     Heart sounds: No murmur heard. Pulmonary:     Effort: Pulmonary effort is normal. No respiratory distress.     Breath sounds: Normal breath sounds.  Abdominal:     General: Abdomen is flat. There is no distension.     Palpations: Abdomen is soft. There is no mass.     Tenderness: There is abdominal tenderness (Suprapubic, right lower quadrant). There is no guarding.  Musculoskeletal:     Cervical back: Normal range of motion and neck supple.     Right lower leg: No edema.     Left lower leg: No edema.  Skin:    General: Skin is warm and dry.  Neurological:     Mental Status: She is alert and oriented to person, place, and time. Mental status is at baseline.  Psychiatric:        Mood and Affect: Mood normal.     ED Results / Procedures / Treatments   Labs (all labs ordered are listed, but only abnormal results are displayed) Labs Reviewed  COMPREHENSIVE METABOLIC PANEL - Abnormal; Notable for the following components:      Result Value   Glucose, Bld 100 (*)    Calcium 8.3 (*)    Albumin 3.4 (*)    AST 53 (*)    ALT 60 (*)    All other components within normal limits  LIPASE, BLOOD  CBC WITH DIFFERENTIAL/PLATELET  URINALYSIS, ROUTINE W REFLEX MICROSCOPIC  POC URINE PREG, ED    EKG None  Radiology US Abdomen Limited  RUQ (LIVER/GB)  Result Date: 05/19/2022 CLINICAL DATA:  Right upper quadrant abdominal pain x3 weeks EXAM: ULTRASOUND ABDOMEN LIMITED RIGHT UPPER QUADRANT COMPARISON:  CT abdomen/pelvis dated 05/14/2022 FINDINGS: Gallbladder: No gallstones or wall thickening visualized. No sonographic Murphy sign noted by sonographer. Common bile duct: Diameter: 3 mm Liver: Hyperechoic hepatic parenchyma, suggesting hepatic steatosis. No focal hepatic lesion is seen. Portal vein is patent on color Doppler imaging with normal direction of blood flow towards the liver. Other: None. IMPRESSION: Hepatic steatosis. Otherwise negative right upper quadrant ultrasound.  Electronically Signed   By: Julian Hy M.D.   On: 05/19/2022 08:34    Procedures Procedures  {Document cardiac monitor, telemetry assessment procedure when appropriate:1}  Medications Ordered in ED Medications  ondansetron (ZOFRAN) injection 4 mg (4 mg Intravenous Given 05/19/22 0750)  morphine (PF) 4 MG/ML injection 4 mg (4 mg Intravenous Given 05/19/22 0818)    ED Course/ Medical Decision Making/ A&P   {   Click here for ABCD2, HEART and other calculatorsREFRESH Note before signing :1}                          Medical Decision Making Amount and/or Complexity of Data Reviewed Labs: ordered. Radiology: ordered.  Risk Prescription drug management.   ***  {Document critical care time when appropriate:1} {Document review of labs and clinical decision tools ie heart score, Chads2Vasc2 etc:1}  {Document your independent review of radiology images, and any outside records:1} {Document your discussion with family members, caretakers, and with consultants:1} {Document social determinants of health affecting pt's care:1} {Document your decision making why or why not admission, treatments were needed:1} Final Clinical Impression(s) / ED Diagnoses Final diagnoses:  None    Rx / DC Orders ED Discharge Orders     None

## 2022-05-19 NOTE — ED Triage Notes (Signed)
Pt c/o R lower abdominal pain since last Wednesday, states she was here weds for abd pain, N/V, which has not gotten better. States it is hard for her to eat without feeling sick afterwards

## 2022-05-19 NOTE — ED Notes (Signed)
Pt able to tolerate PO but states she still feels nauseated.

## 2022-05-26 ENCOUNTER — Other Ambulatory Visit (HOSPITAL_COMMUNITY): Payer: Self-pay

## 2022-05-26 ENCOUNTER — Encounter: Payer: Self-pay | Admitting: Family Medicine

## 2022-05-26 ENCOUNTER — Inpatient Hospital Stay: Payer: Commercial Managed Care - PPO | Admitting: Family Medicine

## 2022-05-26 ENCOUNTER — Encounter: Payer: Self-pay | Admitting: Physician Assistant

## 2022-05-26 ENCOUNTER — Ambulatory Visit: Payer: Commercial Managed Care - PPO | Admitting: Family Medicine

## 2022-05-26 VITALS — BP 98/70 | HR 95 | Temp 98.4°F | Ht 65.0 in | Wt 274.7 lb

## 2022-05-26 DIAGNOSIS — R112 Nausea with vomiting, unspecified: Secondary | ICD-10-CM

## 2022-05-26 MED ORDER — METOCLOPRAMIDE HCL 10 MG PO TABS
10.0000 mg | ORAL_TABLET | Freq: Four times a day (QID) | ORAL | 0 refills | Status: DC
  Filled 2022-05-26 (×2): qty 30, 8d supply, fill #0

## 2022-05-26 MED ORDER — FAMOTIDINE 20 MG PO TABS
20.0000 mg | ORAL_TABLET | Freq: Two times a day (BID) | ORAL | 0 refills | Status: DC
  Filled 2022-05-26: qty 60, 30d supply, fill #0

## 2022-05-26 MED ORDER — ONDANSETRON 4 MG PO TBDP
4.0000 mg | ORAL_TABLET | Freq: Three times a day (TID) | ORAL | 2 refills | Status: DC | PRN
  Filled 2022-05-26: qty 30, 10d supply, fill #0
  Filled 2022-09-13: qty 30, 10d supply, fill #1
  Filled 2023-02-03: qty 30, 10d supply, fill #2

## 2022-05-26 NOTE — Progress Notes (Signed)
Established Patient Office Visit  Subjective   Patient ID: Debra Parker, female    DOB: [birth year]  Age: 37 y.o. MRN: [id number]  Chief Complaint  Patient presents with   Follow-up    Patient presents for ER follow up and complains of recurrent abdominal pain, states she tried eating fried foods last night, recurrent vomiting and diarrhea, used Zofran and Reglan with no relief    Pt is here for follow up of her abdominal pain, nausea and diarrhea. I saw her 05/09/22 and treated her for possible acute diverticulitis. She did not improve and went to the ER twice for recurrent n/v. Pt reports she feels like it has gotten worse over the last couple of weeks, has not bee able to tolerate anything by mouth, she tried to eat last night and she couldn't keep it down, was throwing up overnight. Is able to drink. CT abd/pelvis was done on 05/14/22 and was unremarkable. She reports her stools are loose and green. Is able to tolerate fluids at this time.    Current Outpatient Medications  Medication Instructions   acetaminophen (TYLENOL) 500 mg, Oral, Every 6 hours PRN   albuterol (VENTOLIN HFA) 108 (90 Base) MCG/ACT inhaler 1-2 puffs, Inhalation, Every 6 hours PRN   amitriptyline (ELAVIL) 25 mg, Oral, Daily at bedtime   cyanocobalamin (VITAMIN B12) 1,000 mcg, Oral, Daily   famotidine (PEPCID) 20 mg, Oral, 2 times daily   Insulin Pen Needle (BD PEN NEEDLE NANO 2ND GEN) 32G X 4 MM MISC Use as directed 2 (two) times daily.   levonorgestrel (MIRENA, 52 MG,) 20 MCG/24HR IUD 1 each, Intrauterine,  Once   Magnesium Hydroxide (DULCOLAX PO) 1 tablet, Oral, Daily   metoCLOPramide (REGLAN) 10 mg, Oral, Every 6 hours   ondansetron (ZOFRAN-ODT) 4 MG disintegrating tablet Dissolve 1 tablet (4 mg total) in mouth every 8 (eight) hours as needed for nausea or vomiting.   potassium chloride SA (KLOR-CON M) 20 MEQ tablet 20 mEq, Oral, Daily   prenatal vitamin w/FE, FA (PRENATAL 1 + 1) 27-1 MG TABS tablet 1  tablet, Oral, Daily   promethazine (PHENERGAN) 12.5 mg, Oral, Every 8 hours PRN   Saxenda 3 mg, Subcutaneous, Daily   topiramate (TOPAMAX) 50 MG tablet Take 3 tablets (150 mg total) by mouth 2 (two) times daily in the morning and at bedtime   Vitamin D (Ergocalciferol) (DRISDOL) 50,000 Units, Oral, Every 7 days        Review of Systems  All other systems reviewed and are negative.     Objective:     BP 98/70 (BP Location: Left Arm, Patient Position: Sitting, Cuff Size: Large)   Pulse 95   Temp 98.4 F (36.9 C) (Oral)   Ht 5' 5"$  (1.651 m)   Wt 274 lb 11.2 oz (124.6 kg)   SpO2 98%   BMI 45.71 kg/m     Physical Exam Vitals reviewed.  Constitutional:      Appearance: She is well-developed. She is obese.  Pulmonary:     Effort: Pulmonary effort is normal.  Abdominal:     General: Bowel sounds are normal.     Palpations: Abdomen is soft.     Tenderness: There is abdominal tenderness in the right lower quadrant and left lower quadrant. There is no guarding or rebound.  Neurological:     Mental Status: She is alert.      No results found for any visits on 05/26/22.     The ASCVD Risk score (  Arnett DK, et al., 2019) failed to calculate for the following reasons:   The 2019 ASCVD risk score is only valid for ages 35 to 74    Assessment & Plan:   Problem List Items Addressed This Visit   None Visit Diagnoses     Nausea and vomiting, unspecified vomiting type    -  Primary   Relevant Medications   Unclear etiology, reviewed the ER note and CT abd/pelvis with the patient, will send to GI and continue the 10 mg reglan q 6 prn, also will refill her ondansetron 4 mg every 6-8 hours prn for the nausea. Ordering HIDA scan today as well to rule out biliary dyskinesia.   metoCLOPramide (REGLAN) 10 MG tablet   famotidine (PEPCID) 20 MG tablet   ondansetron (ZOFRAN-ODT) 4 MG disintegrating tablet   Other Relevant Orders   Ambulatory referral to Gastroenterology   NM  Hepato W/Eject Fract       No follow-ups on file.    Farrel Conners, MD

## 2022-05-28 ENCOUNTER — Encounter: Payer: Self-pay | Admitting: Family Medicine

## 2022-05-28 ENCOUNTER — Ambulatory Visit (HOSPITAL_COMMUNITY)
Admission: RE | Admit: 2022-05-28 | Discharge: 2022-05-28 | Disposition: A | Discharge status: Home / Self Care | Payer: Commercial Managed Care - PPO | Source: Ambulatory Visit | Attending: Family Medicine | Admitting: Family Medicine

## 2022-05-28 DIAGNOSIS — R112 Nausea with vomiting, unspecified: Secondary | ICD-10-CM | POA: Diagnosis not present

## 2022-05-28 DIAGNOSIS — R109 Unspecified abdominal pain: Secondary | ICD-10-CM | POA: Diagnosis not present

## 2022-05-28 MED ORDER — TECHNETIUM TC 99M MEBROFENIN IV KIT
4.9000 | PACK | Freq: Once | INTRAVENOUS | Status: AC | PRN
  Administered 2022-05-28: 4.9 via INTRAVENOUS

## 2022-05-30 ENCOUNTER — Other Ambulatory Visit (HOSPITAL_COMMUNITY): Payer: Self-pay

## 2022-05-30 ENCOUNTER — Other Ambulatory Visit: Payer: Commercial Managed Care - PPO

## 2022-05-30 ENCOUNTER — Encounter: Payer: Self-pay | Admitting: Gastroenterology

## 2022-05-30 ENCOUNTER — Ambulatory Visit: Payer: Commercial Managed Care - PPO | Admitting: Gastroenterology

## 2022-05-30 VITALS — BP 110/70 | HR 80 | Ht 64.25 in | Wt 274.4 lb

## 2022-05-30 DIAGNOSIS — Z8719 Personal history of other diseases of the digestive system: Secondary | ICD-10-CM | POA: Diagnosis not present

## 2022-05-30 DIAGNOSIS — R197 Diarrhea, unspecified: Secondary | ICD-10-CM | POA: Diagnosis not present

## 2022-05-30 DIAGNOSIS — R1011 Right upper quadrant pain: Secondary | ICD-10-CM | POA: Diagnosis not present

## 2022-05-30 DIAGNOSIS — R112 Nausea with vomiting, unspecified: Secondary | ICD-10-CM | POA: Diagnosis not present

## 2022-05-30 MED ORDER — PANTOPRAZOLE SODIUM 40 MG PO TBEC
40.0000 mg | DELAYED_RELEASE_TABLET | Freq: Every day | ORAL | 3 refills | Status: DC
  Filled 2022-05-30: qty 90, 90d supply, fill #0
  Filled 2022-09-13: qty 90, 90d supply, fill #1
  Filled 2023-02-03: qty 90, 90d supply, fill #2
  Filled 2023-04-21: qty 90, 90d supply, fill #3

## 2022-05-30 NOTE — Progress Notes (Signed)
Chief Complaint: For GI workup  Referring Provider:  Farrel Conners, MD      ASSESSMENT AND PLAN;   #1. RUQ abdo pain with N/V. Neg [place], CT and HIDA with EF for etiology as below.  Likely d/t Saxenda. R/O gastric etiology.  #2. Diarrhea.  Likely due to Saxenda.  R/O infectious etiology.  #3. H/O diverticulitis 2019.  Radiology recommended colonoscopy.  #4. Abn LFTs likely d/t fatty liver.   Plan: -EGD/colon with miralax -Stop saxenda -protonix '40mg'$  po QD #30 -pepcid '20mg'$  po QHS -Stool studies for GI Pathogen (includes C. Diff), Calprotectin -Encouraged weight loss -If still with problems, solid-phase GES to rule out gastroparesis. -FU in 12 weeks. -At FU, recheck LFTs.  If still elevated, further liver WU.    I discussed EGD/Colonoscopy- the indications, risks, alternatives and potential complications including, but not limited to bleeding, infection, reaction to meds, damage to internal organs, cardiac and/or pulmonary problems, and perforation requiring surgery. The possibility that significant findings could be missed was explained. All ? were answered. Pt consents to proceed.  HPI:    Debra Parker is a 37 y.o. 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  She was perfectly fine before With N/V/diarrhea Started Saxenda a month ago.  Symptoms pretty much started thereafter.  She has stopped [place] but symptoms continue.  Had extensive workup including ultrasound, CT, HIDA with EF as below-negative for etiology.  Sent to GI clinic for further eval.  Describes pain as mild to moderate, not always worse after eating.  Associated with heartburn, loss of appetite, intermittent nausea/vomiting.  She has tried multiple medications including Pepcid, Reglan, Zofran and Phenergan with some but not complete  relief.  Diarrhea occurs 2-3 times per day, mostly after eating.  No melena or hematochezia no nocturnal symptoms.  She has history of diverticulitis and underwent CT in 2019-radiology advised to get colonoscopy performed.  Has been trying to lose weight  Wt Readings from Last 3 Encounters:  05/30/22 274 lb 6 oz (124.5 kg)  05/26/22 274 lb 11.2 oz (124.6 kg)  05/19/22 272 lb (123.4 kg)   No jaundice dark urine or pale stools.   Past GI workup: CT Abdo/pelvis with contrast 05/14/2022 -2 cm adrenal adenoma -No acute findings in the abdomen or pelvis.  Sigmoid diverticulosis without diverticulitis  US abdomen Limited -Hepatic steatosis -Otherwise normal  HIDA with EF 05/28/2022: Nl . EF 69%  CT Abdo/pelvis with contrast 10/18/2017 1. Significant improvement in the appearance of sigmoid colon inflammatory changes. There is persistent focal wall thickening in the proximal sigmoid. Given the persistence of the thickening, malignancy should be excluded but is less likely than resolving diverticulitis. 2. Recommend follow-up CT or colonoscopy. 3. Stable appearance of small RIGHT adrenal nodule which is favored to be benign but has not been fully characterize. 4. Normal location of intrauterine device  Past Medical History:  Diagnosis Date   Bronchitis    Diverticulitis 2019   Hypertension    when pregnant   Kidney stone    Lichen planopilaris    Migraines    Obesity    OSA (obstructive sleep apnea)    Prediabetes    Sleep apnea    Vitamin D deficiency     Past Surgical History:  Procedure Laterality Date   CESAREAN SECTION  2007   fatty pocket removed  1999   WISDOM TOOTH EXTRACTION  2000    Family History  Problem Relation Age of Onset   Hyperlipidemia Mother    Diabetes Mother    Liver disease Mother    Obesity Mother    Obesity Father    Cancer Father    Hypertension Father    Diabetes Father    Hyperlipidemia Father    Early death Sister         stillborn   Heart disease Brother    Heart attack Brother 28   Arthritis Maternal Grandmother    Diabetes Maternal Grandmother    Heart attack Maternal Grandmother    Throat cancer Maternal Grandfather    Diabetes Paternal Grandfather    Stroke Paternal Grandfather    Kidney disease Paternal Grandfather    Stomach cancer Paternal Uncle    Kidney disease Paternal Uncle     Social History   Tobacco Use   Smoking status: Never   Smokeless tobacco: Never  Vaping Use   Vaping Use: Never used  Substance Use Topics   Alcohol use: No   Drug use: No    Current Outpatient Medications  Medication Sig Dispense Refill   acetaminophen (TYLENOL) 500 MG tablet Take 500 mg by mouth every 6 (six) hours as needed for mild pain.     albuterol (VENTOLIN HFA) 108 (90 Base) MCG/ACT inhaler Inhale 1-2 puffs into the lungs every 6 (six) hours as needed for wheezing or shortness of breath. (Patient taking differently: Inhale 2 puffs into the lungs every 6 (six) hours as needed for wheezing or shortness of breath.) 18 g 0   amitriptyline (ELAVIL) 25 MG tablet Take 1 tablet (25 mg total) by mouth at bedtime. 30 tablet 2   cyanocobalamin (VITAMIN B12) 1000 MCG tablet Take 1 tablet (1,000 mcg total) by mouth daily. 90 tablet 0   famotidine (PEPCID) 20 MG tablet Take 1 tablet (20 mg total) by mouth 2 (two) times daily. 60 tablet 0   Insulin Pen Needle (BD PEN NEEDLE NANO 2ND GEN) 32G X 4 MM MISC Use as directed 2 (two) times daily. 100 each 0   levonorgestrel (MIRENA, 52 MG,) 20 MCG/24HR IUD 1 Intra Uterine Device (1 each total) by Intrauterine route once for 1 dose. 1 each 0   Magnesium Hydroxide (DULCOLAX PO) Take 1 tablet by mouth daily.     metoCLOPramide (REGLAN) 10 MG tablet Take 1 tablet (10 mg total) by mouth every 6 (six) hours. 30 tablet 0   ondansetron (ZOFRAN-ODT) 4 MG disintegrating tablet Dissolve 1 tablet (4 mg total) in mouth every 8 (eight) hours as needed for nausea or vomiting. 30 tablet 2    prenatal vitamin w/FE, FA (PRENATAL 1 + 1) 27-1 MG TABS tablet Take 1 tablet by mouth daily.     promethazine (PHENERGAN) 12.5 MG tablet Take 1 tablet (12.5 mg total) by mouth every 8 (eight) hours as needed for nausea or vomiting. 20 tablet 0   topiramate (TOPAMAX) 50 MG tablet Take 3 tablets (150 mg total) by mouth 2 (two) times daily in the morning and at bedtime 540 tablet 1   Vitamin D, Ergocalciferol, (DRISDOL) 1.25 MG (50000 UNIT) CAPS capsule Take 1 capsule (50,000 Units total) by mouth every 7 (seven) days. 12 capsule 3   Liraglutide -Weight Management (SAXENDA) 18 MG/3ML SOPN Inject 3 mg into the skin daily. (Patient not taking: Reported on 05/30/2022) 15 mL 0   No current facility-administered medications for this visit.  Allergies  Allergen Reactions   Aimovig [Erenumab-Aooe] Hives and Rash   Sulfa Antibiotics Rash    Not sure.    Review of Systems:  Constitutional: Denies fever, chills, diaphoresis, appetite change and fatigue.  HEENT: Denies photophobia, eye pain, redness, hearing loss, ear pain, congestion, sore throat, rhinorrhea, sneezing, mouth sores, neck pain, neck stiffness and tinnitus.   Respiratory: Denies SOB, DOE, cough, chest tightness,  and wheezing.   Cardiovascular: Denies chest pain, palpitations and leg swelling.  Genitourinary: Denies dysuria, urgency, frequency, hematuria, flank pain and difficulty urinating.  Musculoskeletal: Denies myalgias, back pain, joint swelling, arthralgias and gait problem.  Skin: No rash.  Neurological: Denies dizziness, seizures, syncope, weakness, light-headedness, numbness and headaches.  Hematological: Denies adenopathy. Easy bruising, personal or family bleeding history  Psychiatric/Behavioral: Has anxiety or depression     Physical Exam:    BP 110/70 (BP Location: Left Arm, Patient Position: Sitting, Cuff Size: Large)   Pulse 80   Ht 5' 4.25" (1.632 m) Comment: height measured without shoes  Wt 274 lb 6 oz  (124.5 kg)   BMI 46.73 kg/m  Wt Readings from Last 3 Encounters:  05/30/22 274 lb 6 oz (124.5 kg)  05/26/22 274 lb 11.2 oz (124.6 kg)  05/19/22 272 lb (123.4 kg)   Constitutional:  Well-developed, in no acute distress. Psychiatric: Normal mood and affect. Behavior is normal. HEENT: Pupils normal.  Conjunctivae are normal. No scleral icterus..  Cardiovascular: Normal rate, regular rhythm. No edema Pulmonary/chest: Effort normal and breath sounds normal. No wheezing, rales or rhonchi. Abdominal: Soft, nondistended. Nontender. Bowel sounds active throughout. There are no masses palpable. No hepatomegaly. Rectal: Deferred Neurological: Alert and oriented to person place and time. Skin: Skin is warm and dry. No rashes noted.  Data Reviewed: I have personally reviewed following labs and imaging studies  CBC:    Latest Ref Rng & Units 05/19/2022    7:50 AM 05/14/2022    6:44 PM 01/15/2022    8:58 AM  CBC  WBC 4.0 - 10.5 K/uL 9.6  11.3  7.6   Hemoglobin 12.0 - 15.0 g/dL 12.5  12.5  13.0   Hematocrit 36.0 - 46.0 % 38.2  38.4  41.6   Platelets 150 - 400 K/uL 250  266  299     CMP:    Latest Ref Rng & Units 05/19/2022    7:50 AM 05/14/2022    6:44 PM 04/01/2022    8:33 AM  CMP  Glucose 70 - 99 mg/dL 100  84  101   BUN 6 - 20 mg/dL '8  10  12   '$ Creatinine 0.44 - 1.00 mg/dL 0.64  0.79  0.73   Sodium 135 - 145 mmol/L 136  137  139   Potassium 3.5 - 5.1 mmol/L 3.8  2.9  3.8   Chloride 98 - 111 mmol/L 105  102  107   CO2 22 - 32 mmol/L '24  25  23   '$ Calcium 8.9 - 10.3 mg/dL 8.3  8.5  8.8   Total Protein 6.5 - 8.1 g/dL 6.8  7.6  7.4   Total Bilirubin 0.3 - 1.2 mg/dL 0.4  0.4  0.4   Alkaline Phos 38 - 126 U/L 53  70  65   AST 15 - 41 U/L 53  63  20   ALT 0 - 44 U/L 60  64  19      Radiology Studies: NM Hepato W/Eject Fract  Result Date: 05/29/2022 EXAM: HEPATOBILIARY NUCLEARSCINTIGRAPHY CLINICAL  DATA:  Abdominal pain TECHNIQUE: Images were obtained following administration of 4.37mi  Tc99M Choletec IV. There is prompt uptake of radiopharmaceutical by the liver. There is normal visualization of activity in bile ducts, small bowel. The gallbladder was visualized within 25 minutes. Following ingestion of 8 ounce Ensure, the calculated gallbladder ejection fraction was 69% which is normal. IMPRESSION: 1. No CBD or cystic duct obstruction. 2. Gallbladder ejection fraction was normal. Electronically Signed   By: JSammie BenchM.D.   On: 05/29/2022 00:48   [place]LVIC COMPLETE W TRANSVAGINAL AND TORSION R/O  Result Date: 05/19/2022 CLINICAL DATA:  Right lower quadrant abdominal pain EXAM: TRANSABDOMINAL AND TRANSVAGINAL ULTRASOUND OF PELVIS DOPPLER ULTRASOUND OF OVARIES TECHNIQUE: Both transabdominal and transvaginal ultrasound examinations of the pelvis were performed. Transabdominal technique was performed for global imaging of the pelvis including uterus, ovaries, adnexal regions, and pelvic cul-de-sac. It was necessary to proceed with endovaginal exam following the transabdominal exam to visualize the adnexa and ovaries. Color and duplex Doppler ultrasound was utilized to evaluate blood flow to the ovaries. COMPARISON:  CT 05/14/2022 and older FINDINGS: Uterus Measurements: 8.6 x 3.4 x 5.5 = volume: 86.1 mL. Heterogeneous myometrium. No discrete mass Endometrium Thickness: 7. IUD in place as expected. There is some fluid along the endometrial canal and endocervical canal. Portions of the endometrium obscured by the IUD Right ovary Measurements: 2.9 x 1.8 x 2.0 = volume: 5.2 mL. Normal appearance/no adnexal mass. Left ovary Measurements: 2.9 x 2.1 x 2.3 = volume: 7.5 mL. Small hypoechoic area along the left ovary could be a small cystic area under 20 mm. No followup imaging recommended. Note: This recommendation does not apply to premenarchal patients or to those with increased risk (genetic, family history, elevated tumor markers or other high-risk factors) of ovarian cancer. Reference:  Radiology 2019 Nov; 293(2):359-371. Pulsed Doppler evaluation of both ovaries demonstrates normal low-resistance arterial and venous waveforms. Other findings No abnormal free fluid. Transabdominal images are very limited due to overlapping bowel gas, soft tissue and a contracted urinary bladder with a poor sonographic window. Prominent uterine vessels. IMPRESSION: 1. IUD in place as expected. There is some fluid along the endometrial canal and endocervical canal. 2. Heterogeneous myometrium without discrete mass. 3. No 4. free fluid in the pelvis. 5. Transabdominal images are very limited due to overlapping bowel gas, soft tissue and a contracted urinary bladder with a poor sonographic window. Electronically Signed   By: AJill SideM.D.   On: 05/19/2022 13:26   [place]Abdomen Limited RUQ (LIVER/GB)  Result Date: 05/19/2022 CLINICAL DATA:  Right upper quadrant abdominal pain x3 weeks EXAM: ULTRASOUND ABDOMEN LIMITED RIGHT UPPER QUADRANT COMPARISON:  CT abdomen/pelvis dated 05/14/2022 FINDINGS: Gallbladder: No gallstones or wall thickening visualized. No sonographic Murphy sign noted by sonographer. Common bile duct: Diameter: 3 mm Liver: Hyperechoic hepatic parenchyma, suggesting hepatic steatosis. No focal hepatic lesion is seen. Portal vein is patent on color Doppler imaging with normal direction of blood flow towards the liver. Other: None. IMPRESSION: Hepatic steatosis. Otherwise negative right upper quadrant ultrasound. Electronically Signed   By: SJulian HyM.D.   On: 05/19/2022 08:34   CT ABDOMEN PELVIS W CONTRAST  Result Date: 05/14/2022 CLINICAL DATA:  Epigastric and right upper quadrant pain EXAM: CT ABDOMEN AND PELVIS WITH CONTRAST TECHNIQUE: Multidetector CT imaging of the abdomen and pelvis was performed using the standard protocol following bolus administration of intravenous contrast. RADIATION DOSE REDUCTION: This exam was performed according to the departmental dose-optimization program  which  includes automated exposure control, adjustment of the mA and/or kV according to patient size and/or use of iterative reconstruction technique. CONTRAST:  169m OMNIPAQUE IOHEXOL 300 MG/ML  SOLN COMPARISON:  10/18/2017 FINDINGS: Lower chest: No acute abnormality Hepatobiliary: No focal hepatic abnormality. Gallbladder unremarkable. Pancreas: No focal abnormality or ductal dilatation. Spleen: No focal abnormality.  Normal size. Adrenals/Urinary Tract: 2 cm right adrenal nodule with central fat density compatible with adenoma or myelolipoma left adrenal gland unremarkable. Punctate nonobstructing stone in the upper pole of the left kidney. No ureteral stones or hydronephrosis. Urinary bladder unremarkable. Stomach/Bowel: Scattered colonic diverticulosis. No active diverticulitis. Stomach and small bowel decompressed, unremarkable. Index normal. Vascular/Lymphatic: No evidence of aneurysm or adenopathy. Reproductive: Uterus and adnexa unremarkable. No mass. IUD in the uterus. Other: No free fluid or free air. Musculoskeletal: No acute bony abnormality. IMPRESSION: No acute findings in the abdomen or pelvis. Punctate left upper pole nephrolithiasis. Electronically Signed   By: KRolm BaptiseM.D.   On: 05/14/2022 22:58    CT reviewed independently and with the patient  RCarmell Austria MD 05/30/2022, 8:46 AM  Cc: MFarrel Conners MD

## 2022-05-30 NOTE — Patient Instructions (Addendum)
_______________________________________________________  If your blood pressure at your visit was 140/90 or greater, please contact your primary care physician to follow up on this.  _______________________________________________________  If you are age 39 or older, your body mass index should be between 23-30. Your Body mass index is 46.73 kg/m. If this is out of the aforementioned range listed, please consider follow up with your Primary Care Provider.  If you are age 68 or younger, your body mass index should be between 19-25. Your Body mass index is 46.73 kg/m. If this is out of the aformentioned range listed, please consider follow up with your Primary Care Provider.   ________________________________________________________  The [facility] [facility] providers would like to encourage you to use [facility] to communicate with providers for non-urgent requests or questions.  Due to long hold times on the telephone, sending your provider a message by [facility] may be a faster and more efficient way to get a response.  Please allow 48 business hours for a response.  Please remember that this is for non-urgent requests.  _______________________________________________________  Your provider has requested that you go to the basement level for lab work before leaving today. Press "B" on the elevator. The lab is located at the first door on the left as you exit the elevator.  You have been scheduled for an endoscopy and colonoscopy. Please follow the written instructions given to you at your visit today. Please pick up your prep supplies at the pharmacy within the next 1-3 days. If you use inhalers (even only as needed), please bring them with you on the day of your procedure.  We have sent the following medications to your pharmacy for you to pick up at your convenience: Protonix  Use Pepcid '20mg'$  at night daily   Stop Saxenda  Please call with any questions or concerns.  Thank you,  Dr. Jackquline Denmark

## 2022-06-01 DIAGNOSIS — G4733 Obstructive sleep apnea (adult) (pediatric): Secondary | ICD-10-CM | POA: Diagnosis not present

## 2022-06-03 ENCOUNTER — Encounter (INDEPENDENT_AMBULATORY_CARE_PROVIDER_SITE_OTHER): Payer: Self-pay | Admitting: Family Medicine

## 2022-06-03 ENCOUNTER — Ambulatory Visit (INDEPENDENT_AMBULATORY_CARE_PROVIDER_SITE_OTHER): Payer: Commercial Managed Care - PPO | Admitting: Family Medicine

## 2022-06-03 VITALS — BP 118/77 | HR 79 | Temp 98.0°F | Ht 65.0 in | Wt 274.0 lb

## 2022-06-03 DIAGNOSIS — R1011 Right upper quadrant pain: Secondary | ICD-10-CM | POA: Diagnosis not present

## 2022-06-03 DIAGNOSIS — E669 Obesity, unspecified: Secondary | ICD-10-CM

## 2022-06-03 DIAGNOSIS — Z6841 Body Mass Index (BMI) 40.0 and over, adult: Secondary | ICD-10-CM | POA: Diagnosis not present

## 2022-06-03 NOTE — Progress Notes (Deleted)
Patient has had to go to the ED and get evaluated for significant upper right quadrant pain in her abdomen.  Has had a very thorough workup and patient reports that the conclusion is likely her gallbladder malfunctioning.  Has upcoming endoscopy and colonoscopy in April.  Patient has noticed worsened pain with eating out.  Could not journal due to inability to get food in.  Patient is no longer on Saxenda due to the GI symptoms she is experiencing.  She has had 2 emergency room evaluations since last appt. She is missing quite a few meals due to abdominal pain and nausea.

## 2022-06-03 NOTE — Progress Notes (Signed)
duplicate

## 2022-06-17 NOTE — Progress Notes (Signed)
Chief Complaint:   OBESITY Debra Parker is here to discuss her progress with her obesity treatment plan along with follow-up of her obesity related diagnoses. Debra Parker is on keeping a food journal and adhering to recommended goals of 1450-1600 calories and 40 protein and states she is following her eating plan approximately 40% of the time. Debra Parker states she is not exercising.  Today's visit was #: 7 Starting weight: 43 LBS Starting date: 01/15/2022 Today's weight: 274 LBS Today's date: 06/03/2022 Total lbs lost to date: 7 LBS Total lbs lost since last in-office visit: 0  Interim History:  Patient has had to go to the ED and get evaluated for significant upper right quadrant pain in her abdomen.  Has had a very thorough workup and patient reports that the conclusion is likely her gallbladder malfunctioning.  Has upcoming endoscopy and colonoscopy in April.  Patient has noticed worsened pain with eating out.  Could not journal due to inability to get food in.  Patient is no longer on Saxenda due to the GI symptoms she is experiencing.  She has had 2 emergency room evaluations since last appt. She is missing quite a few meals due to abdominal pain and nausea.     Subjective:   1. RUQ pain Patient has been experiencing significant pain that is consistent and unchanged since stopping Saxenda.  She reports she has been seen 2 times in the emergency department and unfortunately most of her workup has been negative.  Assessment/Plan:   1. RUQ pain Follow-up with Dr. Lyndel Safe for further management.  Await results from [place] and colonoscopy.  2. BMI 45.0-49.9, adult ([place])  3. Obesity with starting BMI of 46.8 Debra Parker is currently in the action stage of change. As such, her goal is to continue with weight loss efforts. She has agreed to practicing portion control and making smarter food choices, such as increasing vegetables and decreasing simple carbohydrates.   Exercise goals: All adults should  avoid inactivity. Some physical activity is better than none, and adults who participate in any amount of physical activity gain some health benefits.  Behavioral modification strategies: increasing lean protein intake, meal planning and cooking strategies, keeping healthy foods in the home, and planning for success.  Debra Parker has agreed to follow-up with our clinic in 6 weeks. She was informed of the importance of frequent follow-up visits to maximize her success with intensive lifestyle modifications for her multiple health conditions.   Objective:   Blood pressure 118/77, pulse 79, temperature 98 F (36.7 C), height '5\' 5"'$  (1.651 m), weight 274 lb (124.3 kg), SpO2 96 %. Body mass index is 45.6 kg/m.  General: Cooperative, alert, well developed, in no acute distress. HEENT: Conjunctivae and lids unremarkable. Cardiovascular: Regular rhythm.  Lungs: Normal work of breathing. Neurologic: No focal deficits.   Lab Results  Component Value Date   CREATININE 0.64 05/19/2022   BUN 8 05/19/2022   NA 136 05/19/2022   K 3.8 05/19/2022   CL 105 05/19/2022   CO2 24 05/19/2022   Lab Results  Component Value Date   ALT 60 (H) 05/19/2022   AST 53 (H) 05/19/2022   ALKPHOS 53 05/19/2022   BILITOT 0.4 05/19/2022   Lab Results  Component Value Date   HGBA1C 5.8 (A) 04/01/2022   HGBA1C 6.0 (A) 12/25/2021   HGBA1C 5.8 (A) 06/27/2020   HGBA1C 5.9 (H) 02/20/2020   Lab Results  Component Value Date   INSULIN 27.8 (H) 01/15/2022   Lab Results  Component Value Date   TSH 2.37 02/20/2020   Lab Results  Component Value Date   CHOL 154 04/01/2022   HDL 41.10 04/01/2022   LDLCALC 93 04/01/2022   TRIG 96.0 04/01/2022   CHOLHDL 4 04/01/2022   Lab Results  Component Value Date   VD25OH 18.25 (L) 04/01/2022   VD25OH 14.82 (L) 12/31/2021   VD25OH 13 (L) 02/20/2020   Lab Results  Component Value Date   WBC 9.6 05/19/2022   HGB 12.5 05/19/2022   HCT 38.2 05/19/2022   MCV 86.6  05/19/2022   PLT 250 05/19/2022   Lab Results  Component Value Date   FERRITIN 41.4 11/30/2018   Attestation Statements:   Reviewed by clinician on day of visit: allergies, medications, problem list, medical history, surgical history, family history, social history, and previous encounter notes.  Time spent on visit including pre-visit chart review and post-visit care and charting was 25 minutes.   I, Davy Pique, RMA, am acting as transcriptionist for Coralie Common, MD.  I have reviewed the above documentation for accuracy and completeness, and I agree with the above. - Coralie Common, MD

## 2022-06-20 ENCOUNTER — Other Ambulatory Visit (HOSPITAL_COMMUNITY): Payer: Self-pay

## 2022-06-20 ENCOUNTER — Other Ambulatory Visit: Payer: Self-pay | Admitting: Family Medicine

## 2022-06-20 ENCOUNTER — Other Ambulatory Visit: Payer: Self-pay

## 2022-06-20 DIAGNOSIS — R112 Nausea with vomiting, unspecified: Secondary | ICD-10-CM

## 2022-06-20 MED ORDER — METOCLOPRAMIDE HCL 10 MG PO TABS
10.0000 mg | ORAL_TABLET | Freq: Four times a day (QID) | ORAL | 0 refills | Status: DC
  Filled 2022-06-20 (×2): qty 30, 8d supply, fill #0

## 2022-06-20 MED ORDER — FAMOTIDINE 20 MG PO TABS
20.0000 mg | ORAL_TABLET | Freq: Two times a day (BID) | ORAL | 0 refills | Status: DC
  Filled 2022-06-20: qty 60, 30d supply, fill #0

## 2022-06-27 ENCOUNTER — Ambulatory Visit: Payer: Commercial Managed Care - PPO | Admitting: Physician Assistant

## 2022-06-30 DIAGNOSIS — G4733 Obstructive sleep apnea (adult) (pediatric): Secondary | ICD-10-CM | POA: Diagnosis not present

## 2022-07-16 ENCOUNTER — Other Ambulatory Visit: Payer: Commercial Managed Care - PPO

## 2022-07-16 DIAGNOSIS — Z8719 Personal history of other diseases of the digestive system: Secondary | ICD-10-CM

## 2022-07-16 DIAGNOSIS — R197 Diarrhea, unspecified: Secondary | ICD-10-CM | POA: Diagnosis not present

## 2022-07-17 ENCOUNTER — Ambulatory Visit (INDEPENDENT_AMBULATORY_CARE_PROVIDER_SITE_OTHER): Payer: Commercial Managed Care - PPO | Admitting: Family Medicine

## 2022-07-17 ENCOUNTER — Encounter (INDEPENDENT_AMBULATORY_CARE_PROVIDER_SITE_OTHER): Payer: Self-pay | Admitting: Family Medicine

## 2022-07-17 VITALS — BP 108/71 | HR 79 | Temp 98.8°F | Ht 65.0 in | Wt 275.0 lb

## 2022-07-17 DIAGNOSIS — E559 Vitamin D deficiency, unspecified: Secondary | ICD-10-CM | POA: Diagnosis not present

## 2022-07-17 DIAGNOSIS — R7303 Prediabetes: Secondary | ICD-10-CM | POA: Diagnosis not present

## 2022-07-17 DIAGNOSIS — Z6841 Body Mass Index (BMI) 40.0 and over, adult: Secondary | ICD-10-CM | POA: Diagnosis not present

## 2022-07-17 DIAGNOSIS — E669 Obesity, unspecified: Secondary | ICD-10-CM

## 2022-07-17 LAB — CLOSTRIDIUM DIFFICILE TOXIN B, QUALITATIVE, REAL-TIME PCR: Toxigenic C. Difficile by PCR: NOT DETECTED

## 2022-07-17 NOTE — Progress Notes (Signed)
Chief Complaint:   OBESITY Debra Parker is here to discuss her progress with her obesity treatment plan along with follow-up of her obesity related diagnoses. Debra Parker is on practicing portion control and making smarter food choices, such as increasing vegetables and decreasing simple carbohydrates and states she is following her eating plan approximately 90% of the time. Debra Parker states she is gym 30 minutes 3 times per week.  Today's visit was #: 8 Starting weight: 281 lbs Starting date: 01/15/2022 Today's weight: 275 lbs Today's date: 07/16/2021 Total lbs lost to date: 6 lbs Total lbs lost since last in-office visit: +1 lb  Interim History: Patient is going for endoscopy and colonoscopy on April 18th.  She is still having RUQ pain and is still taking protonix daily, promethazine, reglan and zofran as needed. She has been trying to get some nutrition in; sometimes she can get all 3 meals in depending on nausea.  She has been eating more salads at lunch.  Has been trying to get more consistent protein shakes in as a reliable source of protein.   Subjective:   1. Vitamin D deficiency Patient is on prescription Vitamin D.  Patient denies nausea, vomiting, muscle weakness, but is positive for fatigue.  2. Prediabetes Patient last A1c 5.8.  Insulin 27.8.  Patient was previously on Saxenda, but this was stopped due to GI side effects.   Assessment/Plan:   1. Vitamin D deficiency Continue Vitamin D.  Check labs at next appointment.   2. Prediabetes Check labs in July.   3. BMI 45.0-49.9, adult  4. Obesity with starting BMI of 46.8 Debra Parker is currently in the action stage of change. As such, her goal is to continue with weight loss efforts. She has agreed to practicing portion control and making smarter food choices, such as increasing vegetables and decreasing simple carbohydrates.   Exercise goals: All adults should avoid inactivity. Some physical activity is better than none, and  adults who participate in any amount of physical activity gain some health benefits.  Behavioral modification strategies: increasing lean protein intake, meal planning and cooking strategies, keeping healthy foods in the home, and planning for success.  Debra Parker has agreed to follow-up with our clinic in 4-5 weeks. She was informed of the importance of frequent follow-up visits to maximize her success with intensive lifestyle modifications for her multiple health conditions.   Objective:   Blood pressure 108/71, pulse 79, temperature 98.8 F (37.1 C), height 5\' 5"  (1.651 m), weight 275 lb (124.7 kg), SpO2 99 %. Body mass index is 45.76 kg/m.  General: Cooperative, alert, well developed, in no acute distress. HEENT: Conjunctivae and lids unremarkable. Cardiovascular: Regular rhythm.  Lungs: Normal work of breathing. Neurologic: No focal deficits.   Lab Results  Component Value Date   CREATININE 0.64 05/19/2022   BUN 8 05/19/2022   NA 136 05/19/2022   K 3.8 05/19/2022   CL 105 05/19/2022   CO2 24 05/19/2022   Lab Results  Component Value Date   ALT 60 (H) 05/19/2022   AST 53 (H) 05/19/2022   ALKPHOS 53 05/19/2022   BILITOT 0.4 05/19/2022   Lab Results  Component Value Date   HGBA1C 5.8 (A) 04/01/2022   HGBA1C 6.0 (A) 12/25/2021   HGBA1C 5.8 (A) 06/27/2020   HGBA1C 5.9 (H) 02/20/2020   Lab Results  Component Value Date   INSULIN 27.8 (H) 01/15/2022   Lab Results  Component Value Date   TSH 2.37 02/20/2020   Lab Results  Component Value Date   CHOL 154 04/01/2022   HDL 41.10 04/01/2022   LDLCALC 93 04/01/2022   TRIG 96.0 04/01/2022   CHOLHDL 4 04/01/2022   Lab Results  Component Value Date   VD25OH 18.25 (L) 04/01/2022   VD25OH 14.82 (L) 12/31/2021   VD25OH 13 (L) 02/20/2020   Lab Results  Component Value Date   WBC 9.6 05/19/2022   HGB 12.5 05/19/2022   HCT 38.2 05/19/2022   MCV 86.6 05/19/2022   PLT 250 05/19/2022   Lab Results  Component Value  Date   FERRITIN 41.4 11/30/2018   Attestation Statements:   Reviewed by clinician on day of visit: allergies, medications, problem list, medical history, surgical history, family history, social history, and previous encounter notes.  I, Malcolm Metro, RMA, am acting as transcriptionist for Reuben Likes, MD.  I have reviewed the above documentation for accuracy and completeness, and I agree with the above. - Reuben Likes, MD

## 2022-07-18 ENCOUNTER — Encounter: Payer: Self-pay | Admitting: Gastroenterology

## 2022-07-18 LAB — GI PROFILE, STOOL, PCR

## 2022-07-20 ENCOUNTER — Other Ambulatory Visit: Payer: Self-pay | Admitting: Gastroenterology

## 2022-07-20 DIAGNOSIS — A0472 Enterocolitis due to Clostridium difficile, not specified as recurrent: Secondary | ICD-10-CM

## 2022-07-20 MED ORDER — VANCOMYCIN HCL 125 MG PO CAPS
125.0000 mg | ORAL_CAPSULE | Freq: Four times a day (QID) | ORAL | 0 refills | Status: AC
  Filled 2022-07-20: qty 40, 10d supply, fill #0

## 2022-07-20 NOTE — Progress Notes (Signed)
Stool positive for C. difficile toxin Negative PCR  Patient having symptoms  Would treated with vancomycin 125 4 times daily for 10 days She can stop taking Protonix for now Take probiotic 1/day  Discussed in detail over the phone  Hold off on EGD/colonoscopy for now

## 2022-07-21 ENCOUNTER — Other Ambulatory Visit (HOSPITAL_COMMUNITY): Payer: Self-pay

## 2022-07-21 ENCOUNTER — Other Ambulatory Visit (HOSPITAL_BASED_OUTPATIENT_CLINIC_OR_DEPARTMENT_OTHER): Payer: Self-pay

## 2022-07-21 LAB — CALPROTECTIN, FECAL: Calprotectin, Fecal: 27 ug/g (ref 0–120)

## 2022-07-21 NOTE — Telephone Encounter (Signed)
Crystal with [facility] called regarding the C diff toxin A/B. Looks like it has already been taken care of. Medication has been placed for patient to pick up yesterday

## 2022-07-24 ENCOUNTER — Encounter: Payer: Commercial Managed Care - PPO | Admitting: Gastroenterology

## 2022-07-24 ENCOUNTER — Other Ambulatory Visit (HOSPITAL_COMMUNITY): Payer: Self-pay

## 2022-07-24 MED ORDER — HYDROCORTISONE (PERIANAL) 2.5 % EX CREA
1.0000 | TOPICAL_CREAM | Freq: Two times a day (BID) | CUTANEOUS | 2 refills | Status: DC
  Filled 2022-07-24: qty 30, 10d supply, fill #0
  Filled 2022-09-13: qty 30, 10d supply, fill #1

## 2022-07-24 NOTE — Telephone Encounter (Signed)
She can stop taking probiotics Anusol HC cream 1 twice daily after the bowel movement for 10 days Would like her to finish course of vancomycin.  Just  few days left. RG

## 2022-07-24 NOTE — Telephone Encounter (Signed)
Patient said she got constipated and swelling rectally before her treatment of antibiotics and now that she is on her antibiotics and the probiotic now she is having rectal pain with her 4-5 bowel movements (like diarrhea) a day now. Everytime she uses the bathroom she is in tears and have had rectal bleeding that is BRB. Please advise

## 2022-08-14 ENCOUNTER — Other Ambulatory Visit (HOSPITAL_COMMUNITY): Payer: Self-pay

## 2022-08-14 ENCOUNTER — Encounter: Payer: Self-pay | Admitting: Gastroenterology

## 2022-08-14 ENCOUNTER — Other Ambulatory Visit: Payer: Self-pay | Admitting: Internal Medicine

## 2022-08-14 MED ORDER — VANCOMYCIN HCL 125 MG PO CAPS
125.0000 mg | ORAL_CAPSULE | Freq: Four times a day (QID) | ORAL | 0 refills | Status: AC
  Filled 2022-08-14: qty 40, 10d supply, fill #0

## 2022-08-20 ENCOUNTER — Encounter (INDEPENDENT_AMBULATORY_CARE_PROVIDER_SITE_OTHER): Payer: Self-pay | Admitting: Family Medicine

## 2022-08-20 NOTE — Telephone Encounter (Signed)
Please reschedule appt, Thanks

## 2022-08-21 ENCOUNTER — Ambulatory Visit (INDEPENDENT_AMBULATORY_CARE_PROVIDER_SITE_OTHER): Payer: Commercial Managed Care - PPO | Admitting: Family Medicine

## 2022-08-22 ENCOUNTER — Encounter: Payer: Self-pay | Admitting: Nurse Practitioner

## 2022-08-22 ENCOUNTER — Telehealth: Payer: Commercial Managed Care - PPO | Admitting: Nurse Practitioner

## 2022-08-22 DIAGNOSIS — J40 Bronchitis, not specified as acute or chronic: Secondary | ICD-10-CM

## 2022-08-22 MED ORDER — ALBUTEROL SULFATE HFA 108 (90 BASE) MCG/ACT IN AERS: 2.0000 | INHALATION_SPRAY | Freq: Four times a day (QID) | RESPIRATORY_TRACT | 0 refills | Status: DC | PRN

## 2022-08-22 MED ORDER — PREDNISONE 10 MG (21) PO TBPK: ORAL_TABLET | ORAL | 0 refills | Status: DC

## 2022-08-22 MED ORDER — AZITHROMYCIN 250 MG PO TABS: ORAL_TABLET | ORAL | 0 refills | Status: AC

## 2022-08-22 MED ORDER — BENZONATATE 100 MG PO CAPS: 100.0000 mg | ORAL_CAPSULE | Freq: Three times a day (TID) | ORAL | 0 refills | Status: DC | PRN

## 2022-08-22 NOTE — Progress Notes (Signed)
Virtual Visit Consent   Debra Parker, you are scheduled for a virtual visit with a [facility] provider today. Just as with appointments in the office, your consent must be obtained to participate. Your consent will be active for this visit and any virtual visit you may have with one of our providers in the next 365 days. If you have a MyChart account, a copy of this consent can be sent to you electronically.  As this is a virtual visit, video technology does not allow for your provider to perform a traditional examination. This may limit your provider's ability to fully assess your condition. If your provider identifies any concerns that need to be evaluated in person or the need to arrange testing (such as labs, EKG, etc.), we will make arrangements to do so. Although advances in technology are sophisticated, we cannot ensure that it will always work on either your end or our end. If the connection with a video visit is poor, the visit may have to be switched to a telephone visit. With either a video or telephone visit, we are not always able to ensure that we have a secure connection.  By engaging in this virtual visit, you consent to the provision of healthcare and authorize for your insurance to be billed (if applicable) for the services provided during this visit. Depending on your insurance coverage, you may receive a charge related to this service.  I need to obtain your verbal consent now. Are you willing to proceed with your visit today? Debra Parker has provided verbal consent on 08/22/2022 for a virtual visit (video or telephone). [name], FNP  Date: 08/22/2022 5:32 PM  Virtual Visit via Video Note   I, [name], connected with  Debra Parker  ([id number], 12/16/1985) on 08/22/22 at  5:45 PM EDT by a video-enabled telemedicine application and verified that I am speaking with the correct person using two identifiers.  Location: Patient: Virtual Visit  Location Patient: Home Provider: Virtual Visit Location Provider: Home Office   I discussed the limitations of evaluation and management by telemedicine and the availability of in person appointments. The patient expressed understanding and agreed to proceed.    History of Present Illness: Debra Parker is a 37 y.o. who identifies as a female who was assigned female at birth, and is being seen today for a cough.  She has had a cough for the past 1.5 weeks  Her cough is deep and "barking" in quality   Her cough is productive  Her mucous has become darker / green   Denies nasal congestion  She gets bronchitis about 2-3 times  a year  She denies a history of asthma but has been treated for chronic bronchitis  She does have an Albuterol and has been using her inhaler daily since she has been coughing   Denies seasonal allergies   Denies fevers with illness   She has required prednisone in the past for bronchitis episodes   Problems:  Patient Active Problem List   Diagnosis Date Noted   Vitamin D deficiency 12/31/2021   Prediabetes 12/25/2021   OSA (obstructive sleep apnea) 12/25/2021   Lichen planopilaris 02/19/2020   Migraine 02/17/2020   Morbid obesity (HCC) 02/01/2018    Allergies:  Allergies  Allergen Reactions   Aimovig [Erenumab-Aooe] Hives and Rash   Sulfa Antibiotics Rash    Not sure.   Medications:  Current Outpatient Medications:    acetaminophen (TYLENOL) 500 MG tablet, Take 500  mg by mouth every 6 (six) hours as needed for mild pain., Disp: , Rfl:    albuterol (VENTOLIN HFA) 108 (90 Base) MCG/ACT inhaler, Inhale 1-2 puffs into the lungs every 6 (six) hours as needed for wheezing or shortness of breath. (Patient taking differently: Inhale 2 puffs into the lungs every 6 (six) hours as needed for wheezing or shortness of breath.), Disp: 18 g, Rfl: 0   amitriptyline (ELAVIL) 25 MG tablet, Take 1 tablet (25 mg total) by mouth at bedtime., Disp: 30 tablet, Rfl:  2   cyanocobalamin (VITAMIN B12) 1000 MCG tablet, Take 1 tablet (1,000 mcg total) by mouth daily., Disp: 90 tablet, Rfl: 0   famotidine (PEPCID) 20 MG tablet, Take 1 tablet (20 mg total) by mouth 2 (two) times daily., Disp: 60 tablet, Rfl: 0   hydrocortisone (ANUSOL-HC) 2.5 % rectal cream, Place 1 Application rectally 2 (two) times daily. Apply 2 times a day after BM for 10 days, Disp: 30 g, Rfl: 2   Insulin Pen Needle (BD PEN NEEDLE NANO 2ND GEN) 32G X 4 MM MISC, Use as directed 2 (two) times daily., Disp: 100 each, Rfl: 0   levonorgestrel (MIRENA, 52 MG,) 20 MCG/24HR IUD, 1 Intra Uterine Device (1 each total) by Intrauterine route once for 1 dose., Disp: 1 each, Rfl: 0   Liraglutide -Weight Management (SAXENDA) 18 MG/3ML SOPN, Inject 3 mg into the skin daily. (Patient not taking: Reported on 07/17/2022), Disp: 15 mL, Rfl: 0   Magnesium Hydroxide (DULCOLAX PO), Take 1 tablet by mouth daily., Disp: , Rfl:    metoCLOPramide (REGLAN) 10 MG tablet, Take 1 tablet (10 mg total) by mouth every 6 (six) hours., Disp: 30 tablet, Rfl: 0   ondansetron (ZOFRAN-ODT) 4 MG disintegrating tablet, Dissolve 1 tablet (4 mg total) in mouth every 8 (eight) hours as needed for nausea or vomiting., Disp: 30 tablet, Rfl: 2   pantoprazole (PROTONIX) 40 MG tablet, Take 1 tablet (40 mg total) by mouth daily., Disp: 90 tablet, Rfl: 3   prenatal vitamin w/FE, FA (PRENATAL 1 + 1) 27-1 MG TABS tablet, Take 1 tablet by mouth daily., Disp: , Rfl:    promethazine (PHENERGAN) 12.5 MG tablet, Take 1 tablet (12.5 mg total) by mouth every 8 (eight) hours as needed for nausea or vomiting., Disp: 20 tablet, Rfl: 0   topiramate (TOPAMAX) 50 MG tablet, Take 3 tablets (150 mg total) by mouth 2 (two) times daily in the morning and at bedtime, Disp: 540 tablet, Rfl: 1   vancomycin (VANCOCIN) 125 MG capsule, Take 1 capsule (125 mg total) by mouth 4 (four) times daily for 10 days., Disp: 40 capsule, Rfl: 0   Vitamin D, Ergocalciferol, (DRISDOL)  1.25 MG (50000 UNIT) CAPS capsule, Take 1 capsule (50,000 Units total) by mouth every 7 (seven) days., Disp: 12 capsule, Rfl: 3  Observations/Objective: Patient is well-developed, well-nourished in no acute distress.  Resting comfortably  at home.  Head is normocephalic, atraumatic.  No labored breathing.  Speech is clear and coherent with logical content.  Patient is alert and oriented at baseline.    Assessment and Plan: 1. Bronchitis  - azithromycin (ZITHROMAX) 250 MG tablet; Take 2 tablets on day 1, then 1 tablet daily on days 2 through 5  Dispense: 6 tablet; Refill: 0 - predniSONE (STERAPRED UNI-PAK 21 TAB) 10 MG (21) TBPK tablet; Take 6 tablets on day one, 5 on day two, 4 on day three, 3 on day four, 2 on day five, and 1 on  day six. Take with food.  Dispense: 21 tablet; Refill: 0 - benzonatate (TESSALON) 100 MG capsule; Take 1 capsule (100 mg total) by mouth 3 (three) times daily as needed.  Dispense: 30 capsule; Refill: 0 - albuterol (VENTOLIN HFA) 108 (90 Base) MCG/ACT inhaler; Inhale 2 puffs into the lungs every 6 (six) hours as needed for wheezing or shortness of breath.  Dispense: 8 g; Refill: 0     Follow Up Instructions: I discussed the assessment and treatment plan with the patient. The patient was provided an opportunity to ask questions and all were answered. The patient agreed with the plan and demonstrated an understanding of the instructions.  A copy of instructions were sent to the patient via MyChart unless otherwise noted below.    The patient was advised to call back or seek an in-person evaluation if the symptoms worsen or if the condition fails to improve as anticipated.  Time:  I spent 15 minutes with the patient via telehealth technology discussing the above problems/concerns.    [name], FNP

## 2022-09-13 ENCOUNTER — Other Ambulatory Visit: Payer: Self-pay | Admitting: Family Medicine

## 2022-09-13 DIAGNOSIS — G43809 Other migraine, not intractable, without status migrainosus: Secondary | ICD-10-CM

## 2022-09-13 DIAGNOSIS — R112 Nausea with vomiting, unspecified: Secondary | ICD-10-CM

## 2022-09-15 ENCOUNTER — Other Ambulatory Visit (HOSPITAL_COMMUNITY): Payer: Self-pay

## 2022-09-15 ENCOUNTER — Other Ambulatory Visit: Payer: Self-pay

## 2022-09-15 MED ORDER — FAMOTIDINE 20 MG PO TABS
20.0000 mg | ORAL_TABLET | Freq: Two times a day (BID) | ORAL | 0 refills | Status: DC
  Filled 2022-09-15: qty 60, 30d supply, fill #0

## 2022-09-15 MED ORDER — METOCLOPRAMIDE HCL 10 MG PO TABS
10.0000 mg | ORAL_TABLET | Freq: Four times a day (QID) | ORAL | 0 refills | Status: DC
  Filled 2022-09-15: qty 30, 8d supply, fill #0

## 2022-09-15 MED ORDER — AMITRIPTYLINE HCL 25 MG PO TABS
25.0000 mg | ORAL_TABLET | Freq: Every day | ORAL | 2 refills | Status: DC
  Filled 2022-09-15: qty 30, 30d supply, fill #0
  Filled 2023-02-03: qty 30, 30d supply, fill #1
  Filled 2023-04-21: qty 30, 30d supply, fill #2

## 2022-09-15 NOTE — Telephone Encounter (Signed)
Rx done. 

## 2022-09-15 NOTE — Telephone Encounter (Signed)
Ok to refill 

## 2022-09-17 ENCOUNTER — Encounter: Payer: Self-pay | Admitting: Gastroenterology

## 2022-09-17 ENCOUNTER — Other Ambulatory Visit (INDEPENDENT_AMBULATORY_CARE_PROVIDER_SITE_OTHER): Payer: Commercial Managed Care - PPO

## 2022-09-17 ENCOUNTER — Ambulatory Visit: Payer: Commercial Managed Care - PPO | Admitting: Gastroenterology

## 2022-09-17 ENCOUNTER — Other Ambulatory Visit (HOSPITAL_COMMUNITY): Payer: Self-pay

## 2022-09-17 VITALS — BP 120/78 | HR 68 | Ht 65.0 in | Wt 285.4 lb

## 2022-09-17 DIAGNOSIS — R112 Nausea with vomiting, unspecified: Secondary | ICD-10-CM | POA: Diagnosis not present

## 2022-09-17 DIAGNOSIS — R7989 Other specified abnormal findings of blood chemistry: Secondary | ICD-10-CM

## 2022-09-17 DIAGNOSIS — A0472 Enterocolitis due to Clostridium difficile, not specified as recurrent: Secondary | ICD-10-CM

## 2022-09-17 DIAGNOSIS — E538 Deficiency of other specified B group vitamins: Secondary | ICD-10-CM

## 2022-09-17 DIAGNOSIS — R197 Diarrhea, unspecified: Secondary | ICD-10-CM | POA: Diagnosis not present

## 2022-09-17 DIAGNOSIS — R1011 Right upper quadrant pain: Secondary | ICD-10-CM | POA: Diagnosis not present

## 2022-09-17 DIAGNOSIS — Z8719 Personal history of other diseases of the digestive system: Secondary | ICD-10-CM | POA: Diagnosis not present

## 2022-09-17 LAB — COMPREHENSIVE METABOLIC PANEL
ALT: 35 U/L (ref 0–35)
AST: 26 U/L (ref 0–37)
Albumin: 4.1 g/dL (ref 3.5–5.2)
Alkaline Phosphatase: 59 U/L (ref 39–117)
BUN: 11 mg/dL (ref 6–23)
CO2: 28 mEq/L (ref 19–32)
Calcium: 9.2 mg/dL (ref 8.4–10.5)
Chloride: 102 mEq/L (ref 96–112)
Creatinine, Ser: 0.67 mg/dL (ref 0.40–1.20)
GFR: 110.2 mL/min (ref >60.00)
Glucose, Bld: 97 mg/dL (ref 70–99)
Potassium: 4.2 mEq/L (ref 3.5–5.1)
Sodium: 138 mEq/L (ref 135–145)
Total Bilirubin: 0.4 mg/dL (ref 0.2–1.2)
Total Protein: 7.8 g/dL (ref 6.0–8.3)

## 2022-09-17 LAB — CBC WITH DIFFERENTIAL/PLATELET
Basophils Absolute: 0.1 10*3/uL (ref 0.0–0.1)
Basophils Relative: 1.2 % (ref 0.0–3.0)
Eosinophils Absolute: 0.2 10*3/uL (ref 0.0–0.7)
Eosinophils Relative: 2.4 % (ref 0.0–5.0)
HCT: 40.2 % (ref 36.0–46.0)
Hemoglobin: 13.1 g/dL (ref 12.0–15.0)
Lymphocytes Relative: 41.4 % (ref 12.0–46.0)
Lymphs Abs: 3.3 10*3/uL (ref 0.7–4.0)
MCHC: 32.6 g/dL (ref 30.0–36.0)
MCV: 86.3 fl (ref 78.0–100.0)
Monocytes Absolute: 0.5 10*3/uL (ref 0.1–1.0)
Monocytes Relative: 6.3 % (ref 3.0–12.0)
Neutro Abs: 3.9 10*3/uL (ref 1.4–7.7)
Neutrophils Relative %: 48.7 % (ref 43.0–77.0)
Platelets: 283 10*3/uL (ref 150.0–400.0)
RBC: 4.66 Mil/uL (ref 3.87–5.11)
RDW: 13.4 % (ref 11.5–15.5)
WBC: 8 10*3/uL (ref 4.0–10.5)

## 2022-09-17 LAB — VITAMIN B12: Vitamin B-12: 150 pg/mL — ABNORMAL LOW (ref 211–911)

## 2022-09-17 LAB — TSH: TSH: 2.24 u[IU]/mL (ref 0.35–5.50)

## 2022-09-17 NOTE — Patient Instructions (Addendum)
_______________________________________________________  If your blood pressure at your visit was 140/90 or greater, please contact your primary care physician to follow up on this.  _______________________________________________________  If you are age 39 or older, your body mass index should be between 23-30. Your Body mass index is 47.49 kg/m. If this is out of the aforementioned range listed, please consider follow up with your Primary Care Provider.  If you are age 33 or younger, your body mass index should be between 19-25. Your Body mass index is 47.49 kg/m. If this is out of the aformentioned range listed, please consider follow up with your Primary Care Provider.   ________________________________________________________  The [facility] providers would like to encourage you to use [facility] to communicate with providers for non-urgent requests or questions.  Due to long hold times on the telephone, sending your provider a message by [facility] may be a faster and more efficient way to get a response.  Please allow 48 business hours for a response.  Please remember that this is for non-urgent requests.  _______________________________________________________  Your provider has requested that you go to the basement level for lab work before leaving today. Press "B" on the elevator. The lab is located at the first door on the left as you exit the elevator.  Continue Protonix daily as needed  You have been scheduled for an endoscopy and colonoscopy. Please follow the written instructions given to you at your visit today. Please pick up your prep supplies at the pharmacy within the next 1-3 days. If you use inhalers (even only as needed), please bring them with you on the day of your procedure.  _  _   ORAL DIABETIC MEDICATION INSTRUCTIONS  The day before your procedure:  Take your diabetic pill as you do normally  The day of your procedure:  Do not take your diabetic pill   We  will check your blood sugar levels during the admission process and again in Recovery before discharging you home  ______________________________________________________________________  _  _   INSULIN (LONG ACTING) MEDICATION INSTRUCTIONS (Lantus, NPH, 70/30, Humulin, Novolin-N, Levemir, Toujeo, Johann Capers )   The day before your procedure:  Take  your regular evening dose    The day of your procedure:  Do not take your morning dose   _  _   INSULIN (SHORT ACTING) MEDICATION INSTRUCTIONS (Regular, Humulog, Novolog, Apidra, Novolin, Humulin)   The day before your procedure:  Do not take your evening dose   The day of your procedure:  Do not take your morning dose  ______________________________________________________________________                _ _ OTHER NON-INSULIN GLPI AGONIST INJECTABLE DAILY MEDICATIONS         (Tanzeum, Saxenda Byetta, Victoza, SymlinPen, Rebelsus)  Hold the day of the procedure ______________________________________________________________________________________________________________________________________________________     __  __ GLP 1 AGONIST Weekly injectables                   (Bydureon Bcise, [place], [place], [place], [place], [place])    Hold for 7 days prior to the procedure    _  _   INSULIN PUMP MEDICATION INSTRUCTIONS We will contact the physician managing your diabetic care for written dosage instructions for the day before your procedure and the day of your procedure.  Once we have received the instructions, we will contact you.   Please call with any questions or concerns.  Thank you,  Dr. Lynann Bologna

## 2022-09-17 NOTE — Progress Notes (Signed)
Chief Complaint: For GI workup  Referring Provider:  Karie Georges, MD      ASSESSMENT AND PLAN;   #1. RUQ abdo pain with N/V. Neg [place], CT and HIDA with EF for etiology as below.  R/O gastric etiology.  #2. C diff Diarrhea- Resolved (April/may 2024) treated with vancomycin x 2  #3. H/O diverticulitis 2019.  Radiology recommended colonoscopy.  #4. Abn LFTs likely d/t fatty liver.   #5. B12 def on oral supplements  Plan: -EGD/colon with miralax -protonix 40mg  po QD PRN -CBC, CMP, B12, TSH. If LFTs elevated, then liver WU -Continue wt loss clinic.    I discussed EGD/Colonoscopy- the indications, risks, alternatives and potential complications including, but not limited to bleeding, infection, reaction to meds, damage to internal organs, cardiac and/or pulmonary problems, and perforation requiring surgery. The possibility that significant findings could be missed was explained. All ? were answered. Pt consents to proceed.  HPI:    Debra Parker is a 37 y.o. 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 difficile colitis-treated with 2 courses of vancomycin 4/14, 5/9 No further diarrhea  Feels better.  Willing to undergo endoscopic procedures. Some nausea Still R sided pain No hearburn  C/O R flank pain with radiation to RLQ/RUQ x 4 months.  She was perfectly fine before Had extensive workup including ultrasound, CT, HIDA with EF as below-negative for etiology. She has tried multiple medications including Pepcid, Reglan, Zofran and Phenergan with some but not complete relief.  No melena or hematochezia no nocturnal symptoms.  She has history of diverticulitis and underwent CT in 2019-radiology advised to get colonoscopy performed.  Has been trying to lose weight  Wt Readings from Last 3 Encounters:  09/17/22 285 lb 6 oz (129.4 kg)  07/17/22 275  lb (124.7 kg)  06/03/22 274 lb (124.3 kg)   No jaundice dark urine or pale stools.   Past GI workup: CT Abdo/pelvis with contrast 05/14/2022 -2 cm adrenal adenoma -No acute findings in the abdomen or pelvis.  Sigmoid diverticulosis without diverticulitis  US abdomen Limited -Hepatic steatosis -Otherwise normal  HIDA with EF 05/28/2022: Nl . EF 69%  CT Abdo/pelvis with contrast 10/18/2017 1. Significant improvement in the appearance of sigmoid colon inflammatory changes. There is persistent focal wall thickening in the proximal sigmoid. Given the persistence of the thickening, malignancy should be excluded but is less likely than resolving diverticulitis. 2. Recommend follow-up CT or colonoscopy. 3. Stable appearance of small RIGHT adrenal nodule which is favored to be benign but has not been fully characterize. 4. Normal location of intrauterine device  Past Medical History:  Diagnosis Date   Bronchitis    Diverticulitis 2019   Hypertension    when pregnant   Kidney stone    Lichen planopilaris    Migraines    Obesity    OSA (obstructive sleep apnea)    Prediabetes    Sleep apnea    Vitamin D deficiency     Past Surgical History:  Procedure Laterality Date   CESAREAN SECTION  2007   fatty pocket removed  1999   WISDOM TOOTH EXTRACTION  2000    Family History  Problem Relation Age of Onset   Hyperlipidemia Mother    Diabetes Mother    Liver disease Mother    Obesity Mother    Obesity Father    Cancer Father    Hypertension Father  Diabetes Father    Hyperlipidemia Father    Early death Sister        stillborn   Heart disease Brother    Heart attack Brother 12   Arthritis Maternal Grandmother    Diabetes Maternal Grandmother    Heart attack Maternal Grandmother    Throat cancer Maternal Grandfather    Diabetes Paternal Grandfather    Stroke Paternal Grandfather    Kidney disease Paternal Grandfather    Stomach cancer Paternal Uncle    Kidney  disease Paternal Uncle    Colon cancer Neg Hx    Rectal cancer Neg Hx    Esophageal cancer Neg Hx     Social History   Tobacco Use   Smoking status: Never   Smokeless tobacco: Never  Vaping Use   Vaping Use: Never used  Substance Use Topics   Alcohol use: No   Drug use: No    Current Outpatient Medications  Medication Sig Dispense Refill   acetaminophen (TYLENOL) 500 MG tablet Take 500 mg by mouth every 6 (six) hours as needed for mild pain.     albuterol (VENTOLIN HFA) 108 (90 Base) MCG/ACT inhaler Inhale 1-2 puffs into the lungs every 6 (six) hours as needed for wheezing or shortness of breath. (Patient taking differently: Inhale 2 puffs into the lungs every 6 (six) hours as needed for wheezing or shortness of breath.) 18 g 0   albuterol (VENTOLIN HFA) 108 (90 Base) MCG/ACT inhaler Inhale 2 puffs into the lungs every 6 (six) hours as needed for wheezing or shortness of breath. 8 g 0   amitriptyline (ELAVIL) 25 MG tablet Take 1 tablet (25 mg total) by mouth at bedtime. 30 tablet 2   benzonatate (TESSALON) 100 MG capsule Take 1 capsule (100 mg total) by mouth 3 (three) times daily as needed. 30 capsule 0   cyanocobalamin (VITAMIN B12) 1000 MCG tablet Take 1 tablet (1,000 mcg total) by mouth daily. 90 tablet 0   famotidine (PEPCID) 20 MG tablet Take 1 tablet (20 mg total) by mouth 2 (two) times daily. 60 tablet 0   hydrocortisone (ANUSOL-HC) 2.5 % rectal cream Place 1 Application rectally 2 (two) times daily. Apply 2 times a day after BM for 10 days 30 g 2   Insulin Pen Needle (BD PEN NEEDLE NANO 2ND GEN) 32G X 4 MM MISC Use as directed 2 (two) times daily. 100 each 0   Magnesium Hydroxide (DULCOLAX PO) Take 1 tablet by mouth daily.     metoCLOPramide (REGLAN) 10 MG tablet Take 1 tablet (10 mg total) by mouth every 6 (six) hours. 30 tablet 0   ondansetron (ZOFRAN-ODT) 4 MG disintegrating tablet Dissolve 1 tablet (4 mg total) in mouth every 8 (eight) hours as needed for nausea or  vomiting. 30 tablet 2   pantoprazole (PROTONIX) 40 MG tablet Take 1 tablet (40 mg total) by mouth daily. 90 tablet 3   prenatal vitamin w/FE, FA (PRENATAL 1 + 1) 27-1 MG TABS tablet Take 1 tablet by mouth daily.     promethazine (PHENERGAN) 12.5 MG tablet Take 1 tablet (12.5 mg total) by mouth every 8 (eight) hours as needed for nausea or vomiting. 20 tablet 0   topiramate (TOPAMAX) 50 MG tablet Take 3 tablets (150 mg total) by mouth 2 (two) times daily in the morning and at bedtime 540 tablet 1   Vitamin D, Ergocalciferol, (DRISDOL) 1.25 MG (50000 UNIT) CAPS capsule Take 1 capsule (50,000 Units total) by mouth every 7 (seven)  days. 12 capsule 3   levonorgestrel (MIRENA, 52 MG,) 20 MCG/24HR IUD 1 Intra Uterine Device (1 each total) by Intrauterine route once for 1 dose. 1 each 0   Liraglutide -Weight Management (SAXENDA) 18 MG/3ML SOPN Inject 3 mg into the skin daily. (Patient not taking: Reported on 07/17/2022) 15 mL 0   No current facility-administered medications for this visit.    Allergies  Allergen Reactions   Aimovig [Erenumab-Aooe] Hives and Rash   Sulfa Antibiotics Rash    Not sure.    Review of Systems:  Psychiatric/Behavioral: Has anxiety or depression     Physical Exam:    BP 120/78   Pulse 68   Ht 5\' 5"  (1.651 m)   Wt 285 lb 6 oz (129.4 kg)   SpO2 97%   BMI 47.49 kg/m  Wt Readings from Last 3 Encounters:  09/17/22 285 lb 6 oz (129.4 kg)  07/17/22 275 lb (124.7 kg)  06/03/22 274 lb (124.3 kg)   Constitutional:  Well-developed, in no acute distress. Psychiatric: Normal mood and affect. Behavior is normal. HEENT: Pupils normal.  Conjunctivae are normal. No scleral icterus..  Cardiovascular: Normal rate, regular rhythm. No edema Pulmonary/chest: Effort normal and breath sounds normal. No wheezing, rales or rhonchi. Abdominal: Soft, nondistended. Nontender. Bowel sounds active throughout. There are no masses palpable. No hepatomegaly. Rectal: Deferred Neurological:  Alert and oriented to person place and time. Skin: Skin is warm and dry. No rashes noted.  Data Reviewed: I have personally reviewed following labs and imaging studies  CBC:    Latest Ref Rng & Units 05/19/2022    7:50 AM 05/14/2022    6:44 PM 01/15/2022    8:58 AM  CBC  WBC 4.0 - 10.5 K/uL 9.6  11.3  7.6   Hemoglobin 12.0 - 15.0 g/dL 40.9  81.1  91.4   Hematocrit 36.0 - 46.0 % 38.2  38.4  41.6   Platelets 150 - 400 K/uL 250  266  299     CMP:    Latest Ref Rng & Units 05/19/2022    7:50 AM 05/14/2022    6:44 PM 04/01/2022    8:33 AM  CMP  Glucose 70 - 99 mg/dL 782  84  956   BUN 6 - 20 mg/dL 8  10  12    Creatinine 0.44 - 1.00 mg/dL 2.13  0.86  5.78   Sodium 135 - 145 mmol/L 136  137  139   Potassium 3.5 - 5.1 mmol/L 3.8  2.9  3.8   Chloride 98 - 111 mmol/L 105  102  107   CO2 22 - 32 mmol/L 24  25  23    Calcium 8.9 - 10.3 mg/dL 8.3  8.5  8.8   Total Protein 6.5 - 8.1 g/dL 6.8  7.6  7.4   Total Bilirubin 0.3 - 1.2 mg/dL 0.4  0.4  0.4   Alkaline Phos 38 - 126 U/L 53  70  65   AST 15 - 41 U/L 53  63  20   ALT 0 - 44 U/L 60  64  19      Edman Circle, MD 09/17/2022, 8:45 AM  Cc: Karie Georges, MD

## 2022-09-18 ENCOUNTER — Other Ambulatory Visit (HOSPITAL_COMMUNITY): Payer: Self-pay

## 2022-09-18 ENCOUNTER — Other Ambulatory Visit: Payer: Self-pay | Admitting: *Deleted

## 2022-09-18 ENCOUNTER — Telehealth: Payer: Self-pay | Admitting: Gastroenterology

## 2022-09-18 MED ORDER — CYANOCOBALAMIN 1000 MCG/ML IJ SOLN
INTRAMUSCULAR | 1 refills | Status: AC
  Filled 2022-09-18: qty 3, 44d supply, fill #0

## 2022-09-18 NOTE — Telephone Encounter (Signed)
Advised patient that she should be taking 1 ml IM weekly x 2 weeks, then once monthly thereafter as presviously recommended. Patient states that she thought we told her to take it once a day for 2 weeks. States "when we've done it at work before, its been different." I advised that different providers have different preferences as to how they titrate b12 injections but Dr Chales Abrahams wants the above rx. She verbalizes understanding and just wanted to make sure she was doing the right thing.

## 2022-09-18 NOTE — Telephone Encounter (Signed)
PT is calling about B12 injections. When she picked up RX it was different than what she was told. Please advise.

## 2022-09-22 ENCOUNTER — Encounter (INDEPENDENT_AMBULATORY_CARE_PROVIDER_SITE_OTHER): Payer: Self-pay | Admitting: Family Medicine

## 2022-09-22 ENCOUNTER — Ambulatory Visit (INDEPENDENT_AMBULATORY_CARE_PROVIDER_SITE_OTHER): Payer: Commercial Managed Care - PPO | Admitting: Family Medicine

## 2022-09-22 ENCOUNTER — Other Ambulatory Visit (HOSPITAL_COMMUNITY): Payer: Self-pay

## 2022-09-22 VITALS — BP 116/79 | HR 88 | Temp 98.4°F | Ht 65.0 in | Wt 282.0 lb

## 2022-09-22 DIAGNOSIS — E538 Deficiency of other specified B group vitamins: Secondary | ICD-10-CM

## 2022-09-22 DIAGNOSIS — F5089 Other specified eating disorder: Secondary | ICD-10-CM

## 2022-09-22 DIAGNOSIS — E669 Obesity, unspecified: Secondary | ICD-10-CM

## 2022-09-22 DIAGNOSIS — Z6841 Body Mass Index (BMI) 40.0 and over, adult: Secondary | ICD-10-CM

## 2022-09-22 MED ORDER — LOMAIRA 8 MG PO TABS
ORAL_TABLET | ORAL | 0 refills | Status: DC
  Filled 2022-09-22: qty 23, 28d supply, fill #0

## 2022-09-22 NOTE — Progress Notes (Unsigned)
Chief Complaint:   OBESITY Debra Parker is here to discuss her progress with her obesity treatment plan along with follow-up of her obesity related diagnoses. Debra Parker is on practicing portion control and making smarter food choices, such as increasing vegetables and decreasing simple carbohydrates and states she is following her eating plan approximately 95% of the time. Debra Parker states she is exercising with a trainer for 45 minutes 2 times per week.  Today's visit was #: 9 Starting weight: 281 lbs Starting date: 01/15/2022 Today's weight: 282 lbs Today's date: 09/22/2022 Total lbs lost to date: 0 Total lbs lost since last in-office visit: 0  Interim History: Patient has colonoscopy and endoscopy scheduled for July, which was postponed due to finding out she has C. Diff and that requiring extensive antibiotics.  She feels better some days and other days when she feels nauseous and sick to her stomach.  She has been logging calories and protein on My Fitness Pal.  She has started working with a Systems analyst and he has been very encouraging to her. Average intake was 1519 and average protein per day was 64g.   Subjective:   1. Vitamin B12 deficiency Patient is to start injections due to chronically low B12.  She is on oral B12 with no improvement.  2. Other disorder of eating Patient is on topiramate already.  GLP-1 no longer covered by her insurance.  PDMP was checked with no concerns.  Controlled substance contract was signed by the patient.  Her starting weight is 282 pounds.  Assessment/Plan:   1. Vitamin B12 deficiency We will repeat labs after 3 months of supplementation.  2. Other disorder of eating Patient agreed to start Lomaira 4 mg daily for 14 days then increase to 8 mg daily for 14 days, with no refills.  - Phentermine HCl (LOMAIRA) 8 MG TABS; Take 1/2 tablet (4mg ) by mouth daily for 14 days, THEN 1 tablet (8 mg) daily for 14 days.  Dispense: 23 tablet; Refill: 0  3.  BMI 45.0-49.9, adult (HCC)  4. Obesity with starting BMI of 46.8 Debra Parker is currently in the action stage of change. As such, her goal is to continue with weight loss efforts. She has agreed to keeping a food journal and adhering to recommended goals of 1450-1600 calories and 90+ grams of protein daily.   Exercise goals: All adults should avoid inactivity. Some physical activity is better than none, and adults who participate in any amount of physical activity gain some health benefits.  Behavioral modification strategies: increasing lean protein intake, no skipping meals, meal planning and cooking strategies, keeping healthy foods in the home, and planning for success.  Debra Parker has agreed to follow-up with our clinic in 3 to 4 weeks. She was informed of the importance of frequent follow-up visits to maximize her success with intensive lifestyle modifications for her multiple health conditions.   Objective:   Blood pressure 116/79, pulse 88, temperature 98.4 F (36.9 C), height 5\' 5"  (1.651 m), weight 282 lb (127.9 kg), SpO2 97 %. Body mass index is 46.93 kg/m.  General: Cooperative, alert, well developed, in no acute distress. HEENT: Conjunctivae and lids unremarkable. Cardiovascular: Regular rhythm.  Lungs: Normal work of breathing. Neurologic: No focal deficits.   Lab Results  Component Value Date   CREATININE 0.67 09/17/2022   BUN 11 09/17/2022   NA 138 09/17/2022   K 4.2 09/17/2022   CL 102 09/17/2022   CO2 28 09/17/2022   Lab Results  Component Value  Date   ALT 35 09/17/2022   AST 26 09/17/2022   ALKPHOS 59 09/17/2022   BILITOT 0.4 09/17/2022   Lab Results  Component Value Date   HGBA1C 5.8 (A) 04/01/2022   HGBA1C 6.0 (A) 12/25/2021   HGBA1C 5.8 (A) 06/27/2020   HGBA1C 5.9 (H) 02/20/2020   Lab Results  Component Value Date   INSULIN 27.8 (H) 01/15/2022   Lab Results  Component Value Date   TSH 2.24 09/17/2022   Lab Results  Component Value Date   CHOL  154 04/01/2022   HDL 41.10 04/01/2022   LDLCALC 93 04/01/2022   TRIG 96.0 04/01/2022   CHOLHDL 4 04/01/2022   Lab Results  Component Value Date   VD25OH 18.25 (L) 04/01/2022   VD25OH 14.82 (L) 12/31/2021   VD25OH 13 (L) 02/20/2020   Lab Results  Component Value Date   WBC 8.0 09/17/2022   HGB 13.1 09/17/2022   HCT 40.2 09/17/2022   MCV 86.3 09/17/2022   PLT 283.0 09/17/2022   Lab Results  Component Value Date   FERRITIN 41.4 11/30/2018   Attestation Statements:   Reviewed by clinician on day of visit: allergies, medications, problem list, medical history, surgical history, family history, social history, and previous encounter notes.   I, Burt Knack, am acting as transcriptionist for Reuben Likes, MD.  I have reviewed the above documentation for accuracy and completeness, and I agree with the above. - Reuben Likes, MD

## 2022-10-01 ENCOUNTER — Ambulatory Visit: Payer: Commercial Managed Care - PPO | Admitting: Family Medicine

## 2022-10-01 ENCOUNTER — Encounter: Payer: Self-pay | Admitting: Family Medicine

## 2022-10-01 ENCOUNTER — Other Ambulatory Visit (HOSPITAL_COMMUNITY): Payer: Self-pay

## 2022-10-01 VITALS — BP 108/78 | HR 72 | Temp 98.3°F | Ht 65.0 in | Wt 278.5 lb

## 2022-10-01 DIAGNOSIS — R600 Localized edema: Secondary | ICD-10-CM

## 2022-10-01 DIAGNOSIS — R7303 Prediabetes: Secondary | ICD-10-CM

## 2022-10-01 DIAGNOSIS — J42 Unspecified chronic bronchitis: Secondary | ICD-10-CM | POA: Insufficient documentation

## 2022-10-01 DIAGNOSIS — J410 Simple chronic bronchitis: Secondary | ICD-10-CM

## 2022-10-01 LAB — POCT GLYCOSYLATED HEMOGLOBIN (HGB A1C): Hemoglobin A1C: 5.8 % — AB (ref 4.0–5.6)

## 2022-10-01 MED ORDER — FUROSEMIDE 20 MG PO TABS
10.0000 mg | ORAL_TABLET | Freq: Every day | ORAL | 0 refills | Status: AC | PRN
  Filled 2022-10-01: qty 30, 60d supply, fill #0

## 2022-10-01 MED ORDER — METHYLPREDNISOLONE 4 MG PO TBPK
ORAL_TABLET | ORAL | 0 refills | Status: DC
  Filled 2022-10-01: qty 21, 6d supply, fill #0

## 2022-10-01 NOTE — Assessment & Plan Note (Addendum)
On phentermine and topiramate, she has already lost weight, tolerating these medications well, is following with the weight loss center. Ordered salivary cortisol x 2 for patient to rule out adrenal problems.

## 2022-10-01 NOTE — Assessment & Plan Note (Signed)
A1C is 5.8, she continues to be followed by the weight loss center, reviewed labs done last week. Continue low carb diet.

## 2022-10-01 NOTE — Progress Notes (Signed)
Established Patient Office Visit  Subjective   Patient ID: Debra Parker, female    DOB: 02/06/1986  Age: 37 y.o. MRN: [id number]  Chief Complaint  Patient presents with   Medical Management of Chronic Issues    Patient is here for follow up today, states she was diagnosed with C diff and took 20 days of vancomycin, states that her symptoms are gradually improved, much better but still having pain in the RUQ, getting a GB evaluation, continues to see the GI specialist. I reviewed her notes from the GI physician  Weight loss-- pt continues to see the weight loss physician, she was started on topiramate and phentermine, states that she is still titrating up her dose. No side effects to the medication so far. Patient states that she was told she needed an adrenal work up-- states that they saw a 2cm adrenal nodule on the right, it was read as consistent with adenoma or myelolipoma. States that it was recommended by her specialist. I reviewed the labs from the weight loss center done last week  Patinet is concerned about fluid in her hands and feet/ankles. Noticed this about 2 weeks ago, no chest pain or SOB, she reports she does not eat any salt. Discussed reasons for the swelling. Recommended compression stockings while at work.     Current Outpatient Medications  Medication Instructions   acetaminophen (TYLENOL) 500 mg, Oral, Every 6 hours PRN   albuterol (VENTOLIN HFA) 108 (90 Base) MCG/ACT inhaler 2 puffs, Inhalation, Every 6 hours PRN   amitriptyline (ELAVIL) 25 mg, Oral, Daily at bedtime   benzonatate (TESSALON) 100 mg, Oral, 3 times daily PRN   cyanocobalamin (VITAMIN B12) 1000 MCG/ML injection Inject 1 mL (1,000 mcg total) into the muscle once a week for 14 days, THEN 1 mL (1,000 mcg total) every 30 (thirty) days.   cyanocobalamin (VITAMIN B12) 1,000 mcg, Intramuscular,  Once   famotidine (PEPCID) 20 mg, Oral, 2 times daily   furosemide (LASIX) 10 mg, Oral, Daily PRN    hydrocortisone (ANUSOL-HC) 2.5 % rectal cream 1 Application, Rectal, 2 times daily, Apply 2 times a day after BM for 10 days   Insulin Pen Needle (BD PEN NEEDLE NANO 2ND GEN) 32G X 4 MM MISC Use as directed 2 (two) times daily.   levonorgestrel (MIRENA, 52 MG,) 20 MCG/24HR IUD 1 each, Intrauterine,  Once   methylPREDNISolone (MEDROL DOSEPAK) 4 MG TBPK tablet Take package as directed. Instructions on package   metoCLOPramide (REGLAN) 10 mg, Oral, Every 6 hours   ondansetron (ZOFRAN-ODT) 4 MG disintegrating tablet Dissolve 1 tablet (4 mg total) in mouth every 8 (eight) hours as needed for nausea or vomiting.   pantoprazole (PROTONIX) 40 mg, Oral, Daily   Phentermine HCl (LOMAIRA) 8 MG TABS Take 1/2 tablet (4mg ) by mouth daily for 14 days, THEN 1 tablet (8 mg) daily for 14 days.   prenatal vitamin w/FE, FA (PRENATAL 1 + 1) 27-1 MG TABS tablet 1 tablet, Oral, Daily   promethazine (PHENERGAN) 12.5 mg, Oral, Every 8 hours PRN   topiramate (TOPAMAX) 50 MG tablet Take 3 tablets (150 mg total) by mouth 2 (two) times daily in the morning and at bedtime   Vitamin D (Ergocalciferol) (DRISDOL) 50,000 Units, Oral, Every 7 days    Patient Active Problem List   Diagnosis Date Noted   Chronic bronchitis (HCC) 10/01/2022   Vitamin D deficiency 12/31/2021   Prediabetes 12/25/2021   OSA (obstructive sleep apnea) 12/25/2021   Lichen planopilaris  02/19/2020   Migraine 02/17/2020   Morbid obesity (HCC) 02/01/2018      Review of Systems  All other systems reviewed and are negative.     Objective:     BP 108/78 (BP Location: Left Arm, Patient Position: Sitting, Cuff Size: Large)   Pulse 72   Temp 98.3 F (36.8 C) (Oral)   Ht 5\' 5"  (1.651 m)   Wt 278 lb 8 oz (126.3 kg)   SpO2 98%   BMI 46.34 kg/m    Physical Exam Vitals reviewed.  Constitutional:      Appearance: Normal appearance. She is well-groomed. She is morbidly obese.  Eyes:     Conjunctiva/sclera: Conjunctivae normal.  Neck:      Thyroid: No thyromegaly.  Cardiovascular:     Rate and Rhythm: Normal rate and regular rhythm.     Pulses: Normal pulses.     Heart sounds: S1 normal and S2 normal.  Pulmonary:     Effort: Pulmonary effort is normal.     Breath sounds: Normal breath sounds and air entry.  Abdominal:     General: Bowel sounds are normal.  Musculoskeletal:     Right lower leg: Edema (mild pitting edema BL ankles) present.     Left lower leg: Edema present.  Neurological:     Mental Status: She is alert and oriented to person, place, and time. Mental status is at baseline.     Gait: Gait is intact.  Psychiatric:        Mood and Affect: Mood and affect normal.        Speech: Speech normal.        Behavior: Behavior normal.        Judgment: Judgment normal.      Results for orders placed or performed in visit on 10/01/22  POC HgB A1c  Result Value Ref Range   Hemoglobin A1C 5.8 (A) 4.0 - 5.6 %   HbA1c POC (<> result, manual entry)     HbA1c, POC (prediabetic range)     HbA1c, POC (controlled diabetic range)      Last CBC Lab Results  Component Value Date   WBC 8.0 09/17/2022   HGB 13.1 09/17/2022   HCT 40.2 09/17/2022   MCV 86.3 09/17/2022   MCH 28.3 05/19/2022   RDW 13.4 09/17/2022   PLT 283.0 09/17/2022   Last metabolic panel Lab Results  Component Value Date   GLUCOSE 97 09/17/2022   NA 138 09/17/2022   K 4.2 09/17/2022   CL 102 09/17/2022   CO2 28 09/17/2022   BUN 11 09/17/2022   CREATININE 0.67 09/17/2022   GFRNONAA >60 05/19/2022   CALCIUM 9.2 09/17/2022   PROT 7.8 09/17/2022   ALBUMIN 4.1 09/17/2022   BILITOT 0.4 09/17/2022   ALKPHOS 59 09/17/2022   AST 26 09/17/2022   ALT 35 09/17/2022   ANIONGAP 7 05/19/2022      The ASCVD Risk score (Arnett DK, et al., 2019) failed to calculate for the following reasons:   The 2019 ASCVD risk score is only valid for ages 33 to 1    Assessment & Plan:  Prediabetes Assessment & Plan: A1C is 5.8, she continues to be  followed by the weight loss center, reviewed labs done last week. Continue low carb diet.  Orders: -     POCT glycosylated hemoglobin (Hb A1C)  Morbid obesity (HCC) Assessment & Plan: On phentermine and topiramate, she has already lost weight, tolerating these medications well, is following with the  weight loss center. Ordered salivary cortisol x 2 for patient to rule out adrenal problems.   Orders: -     Salivary Cortisol; Future -     Salivary Cortisol; Future  Peripheral edema -     Furosemide; Take 0.5 tablets (10 mg total) by mouth daily as needed.  Dispense: 30 tablet; Refill: 0 Likely dependent edema, secondary to hot weather, I worry about giving her diuretics because of the risk of dehydration. This was discussed at length with the patient, I advised using the furosemide sparingly because of the risk of dehydration.   Simple chronic bronchitis (HCC) -  Pt requesting rx for medrol dose pak to have at home, states that it is difficult for her to be seen every time she is ill. Will give her a dose pak to have in case of flare up of her bronchitis.      methylPREDNISolone; Take package as directed. Instructions on package  Dispense: 21 each; Refill: 0     Return for annual physical exam-- before the deadline-- ok to do 15 minute slot.    Karie Georges, MD

## 2022-10-02 ENCOUNTER — Other Ambulatory Visit: Payer: Self-pay | Admitting: Family Medicine

## 2022-10-03 ENCOUNTER — Telehealth: Payer: Self-pay | Admitting: Family Medicine

## 2022-10-03 ENCOUNTER — Other Ambulatory Visit: Payer: Self-pay

## 2022-10-03 NOTE — Telephone Encounter (Signed)
Lab calling to check on progress of second order. Leaving at 4pm

## 2022-10-03 NOTE — Telephone Encounter (Signed)
Thayer Ohm with [facility] is calling and pt dropped out 2 saliva kits and chris has order for just 1 please put another order in

## 2022-10-06 ENCOUNTER — Other Ambulatory Visit: Payer: Self-pay

## 2022-10-06 NOTE — Telephone Encounter (Signed)
Spoke with Blima Singer, lab tech as PCP entered 2 orders initially. Blima Singer stated she released the order and I spoke with Natalia Leatherwood with [facility] and she stated the message will be forwarded to [facility].

## 2022-10-10 LAB — CORTISOL, SALIVARY
Cortisol, Saliva: 0.29 ug/dL
Cortisol, Saliva: 0.28 ug/dL

## 2022-10-21 ENCOUNTER — Encounter (INDEPENDENT_AMBULATORY_CARE_PROVIDER_SITE_OTHER): Payer: Self-pay | Admitting: Family Medicine

## 2022-10-21 ENCOUNTER — Other Ambulatory Visit (HOSPITAL_COMMUNITY): Payer: Self-pay

## 2022-10-21 ENCOUNTER — Ambulatory Visit (INDEPENDENT_AMBULATORY_CARE_PROVIDER_SITE_OTHER): Payer: Commercial Managed Care - PPO | Admitting: Family Medicine

## 2022-10-21 VITALS — BP 118/77 | HR 73 | Temp 98.3°F | Ht 65.0 in | Wt 274.0 lb

## 2022-10-21 DIAGNOSIS — F5089 Other specified eating disorder: Secondary | ICD-10-CM | POA: Diagnosis not present

## 2022-10-21 DIAGNOSIS — Z6841 Body Mass Index (BMI) 40.0 and over, adult: Secondary | ICD-10-CM | POA: Diagnosis not present

## 2022-10-21 DIAGNOSIS — G43809 Other migraine, not intractable, without status migrainosus: Secondary | ICD-10-CM | POA: Diagnosis not present

## 2022-10-21 DIAGNOSIS — E669 Obesity, unspecified: Secondary | ICD-10-CM

## 2022-10-21 MED ORDER — LOMAIRA 8 MG PO TABS
8.0000 mg | ORAL_TABLET | Freq: Every day | ORAL | 0 refills | Status: DC
  Filled 2022-10-21: qty 30, 30d supply, fill #0

## 2022-10-21 MED ORDER — TOPIRAMATE 50 MG PO TABS
150.0000 mg | ORAL_TABLET | Freq: Two times a day (BID) | ORAL | 0 refills | Status: DC
  Filled 2022-10-21: qty 540, 90d supply, fill #0

## 2022-10-21 NOTE — Progress Notes (Signed)
Chief Complaint:   OBESITY Debra Parker is here to discuss her progress with her obesity treatment plan along with follow-up of her obesity related diagnoses. Debra Parker is on keeping a food journal and adhering to recommended goals of 1450-1600 calories and 90+ grams of protein and states she is following her eating plan approximately 90% of the time. Debra Parker states she is at the gym and walking at work for 60 minutes 3 times per week.  Today's visit was #: 10 Starting weight: 281 lbs Starting date: 01/15/2022 Today's weight: 274 lbs Today's date: 10/21/2022 Total lbs lost to date: 7 Total lbs lost since last in-office visit: 8  Interim History: Patient is logging and hitting her protein and calorie goals 90% of the time.  Her weakness is making sure she actually logs. She has a vacation to [facility] in a few days and will shop, see some shows and relax.  She is excited for this trip.  She has increased her water intake and that has decreased her sweet tea consumption and soda. She is hopefully to continue this while away. Her biggest obstacle will be staying away from her sweet tea. She will be mostly eating out while away and is planning to stay conscious of choices she is making. She is working with trainer 2 times a week and doing individual work 1x a week.  She is definitely feeling soreness day after her workout.  Subjective:   1. Other disorder of eating Patient is on Lomaira 8 mg with increased dose of Topamax for migraines.  Patient is down 8 pounds (need to get down 6 more pounds for 5% weight loss).  PDMP was checked with no concerns.  2. Other migraine without status migrainosus, not intractable Patient is on Topamax by neurology.  No side effects of mental clouding or slowing.  Assessment/Plan:   1. Other disorder of eating Patient will continue Lomaira 8 mg daily, and we will refill for 1 month.  - Phentermine HCl (LOMAIRA) 8 MG TABS; Take 1 tablet (8 mg total) by mouth  daily.  Dispense: 30 tablet; Refill: 0  2. Other migraine without status migrainosus, not intractable Patient will continue Topamax 150 mg twice daily, and we will refill for 90 days.  - topiramate (TOPAMAX) 50 MG tablet; Take 3 tablets (150 mg total) by mouth 2 (two) times daily in the morning and at bedtime  Dispense: 540 tablet; Refill: 0  3. BMI 45.0-49.9, adult (HCC)  4. Obesity  with starting BMI of 46.8 Debra Parker is currently in the action stage of change. As such, her goal is to continue with weight loss efforts. She has agreed to keeping a food journal and adhering to recommended goals of 1450-1600 calories and 90+ grams of protein daily.   Exercise goals: As is.  Continue working out with a Psychologist, educational and alone.  Behavioral modification strategies: increasing lean protein intake, meal planning and cooking strategies, keeping healthy foods in the home, and planning for success.  Debra Parker has agreed to follow-up with our clinic in 4 weeks. She was informed of the importance of frequent follow-up visits to maximize her success with intensive lifestyle modifications for her multiple health conditions.   Objective:   Blood pressure 118/77, pulse 73, temperature 98.3 F (36.8 C), height 5\' 5"  (1.651 m), weight 274 lb (124.3 kg), SpO2 98%. Body mass index is 45.6 kg/m.  General: Cooperative, alert, well developed, in no acute distress. HEENT: Conjunctivae and lids unremarkable. Cardiovascular: Regular rhythm.  Lungs: Normal work of breathing. Neurologic: No focal deficits.   Lab Results  Component Value Date   CREATININE 0.67 09/17/2022   BUN 11 09/17/2022   NA 138 09/17/2022   K 4.2 09/17/2022   CL 102 09/17/2022   CO2 28 09/17/2022   Lab Results  Component Value Date   ALT 35 09/17/2022   AST 26 09/17/2022   ALKPHOS 59 09/17/2022   BILITOT 0.4 09/17/2022   Lab Results  Component Value Date   HGBA1C 5.8 (A) 10/01/2022   HGBA1C 5.8 (A) 04/01/2022   HGBA1C 6.0 (A)  12/25/2021   HGBA1C 5.8 (A) 06/27/2020   HGBA1C 5.9 (H) 02/20/2020   Lab Results  Component Value Date   INSULIN 27.8 (H) 01/15/2022   Lab Results  Component Value Date   TSH 2.24 09/17/2022   Lab Results  Component Value Date   CHOL 154 04/01/2022   HDL 41.10 04/01/2022   LDLCALC 93 04/01/2022   TRIG 96.0 04/01/2022   CHOLHDL 4 04/01/2022   Lab Results  Component Value Date   VD25OH 18.25 (L) 04/01/2022   VD25OH 14.82 (L) 12/31/2021   VD25OH 13 (L) 02/20/2020   Lab Results  Component Value Date   WBC 8.0 09/17/2022   HGB 13.1 09/17/2022   HCT 40.2 09/17/2022   MCV 86.3 09/17/2022   PLT 283.0 09/17/2022   Lab Results  Component Value Date   FERRITIN 41.4 11/30/2018   Attestation Statements:   Reviewed by clinician on day of visit: allergies, medications, problem list, medical history, surgical history, family history, social history, and previous encounter notes.   I, Burt Knack, am acting as transcriptionist for Reuben Likes, MD.  I have reviewed the above documentation for accuracy and completeness, and I agree with the above. - Reuben Likes, MD

## 2022-10-22 ENCOUNTER — Other Ambulatory Visit: Payer: Self-pay | Admitting: Family Medicine

## 2022-10-22 ENCOUNTER — Other Ambulatory Visit: Payer: Self-pay

## 2022-10-22 DIAGNOSIS — R112 Nausea with vomiting, unspecified: Secondary | ICD-10-CM

## 2022-10-22 DIAGNOSIS — E538 Deficiency of other specified B group vitamins: Secondary | ICD-10-CM

## 2022-10-23 ENCOUNTER — Other Ambulatory Visit (HOSPITAL_COMMUNITY): Payer: Self-pay

## 2022-10-23 MED ORDER — METOCLOPRAMIDE HCL 10 MG PO TABS
10.0000 mg | ORAL_TABLET | Freq: Four times a day (QID) | ORAL | 0 refills | Status: DC
  Filled 2022-10-23 – 2022-11-20 (×2): qty 30, 8d supply, fill #0

## 2022-10-23 MED ORDER — CYANOCOBALAMIN 1000 MCG/ML IJ SOLN
1000.0000 ug | INTRAMUSCULAR | 5 refills | Status: DC
  Filled 2022-10-23 – 2022-11-20 (×2): qty 1, 30d supply, fill #0
  Filled 2023-02-03: qty 1, 30d supply, fill #1
  Filled 2023-04-21: qty 1, 30d supply, fill #2
  Filled 2023-06-19 – 2023-07-14 (×2): qty 1, 30d supply, fill #3
  Filled 2023-08-18: qty 1, 30d supply, fill #4

## 2022-10-24 ENCOUNTER — Ambulatory Visit (AMBULATORY_SURGERY_CENTER): Payer: Commercial Managed Care - PPO | Admitting: Gastroenterology

## 2022-10-24 ENCOUNTER — Encounter: Payer: Self-pay | Admitting: Gastroenterology

## 2022-10-24 VITALS — BP 127/68 | HR 82 | Temp 98.9°F | Resp 14 | Ht 65.0 in | Wt 285.0 lb

## 2022-10-24 DIAGNOSIS — R112 Nausea with vomiting, unspecified: Secondary | ICD-10-CM | POA: Diagnosis not present

## 2022-10-24 DIAGNOSIS — G4733 Obstructive sleep apnea (adult) (pediatric): Secondary | ICD-10-CM | POA: Diagnosis not present

## 2022-10-24 DIAGNOSIS — R197 Diarrhea, unspecified: Secondary | ICD-10-CM | POA: Diagnosis not present

## 2022-10-24 DIAGNOSIS — K3189 Other diseases of stomach and duodenum: Secondary | ICD-10-CM | POA: Diagnosis not present

## 2022-10-24 DIAGNOSIS — E669 Obesity, unspecified: Secondary | ICD-10-CM | POA: Diagnosis not present

## 2022-10-24 MED ORDER — SODIUM CHLORIDE 0.9 % IV SOLN: 500.0000 mL | INTRAVENOUS | Status: DC

## 2022-10-24 NOTE — Patient Instructions (Signed)
Thank you for coming in to see [place] today. Resume your diet and medications today.  Continue using HC 2.5% cream twice per day x 10 days per rectum Continue Protonix 40 mg daily. Biopsy results will be available in 1-2 weeks, you will be notified . Return to regular daily activities tomorrow.   YOU HAD AN ENDOSCOPIC PROCEDURE TODAY AT THE [facility]:   Refer to the procedure report that was given to you for any specific questions about what was found during the examination.  If the procedure report does not answer your questions, please call your gastroenterologist to clarify.  If you requested that your care partner not be given the details of your procedure findings, then the procedure report has been included in a sealed envelope for you to review at your convenience later.  YOU SHOULD EXPECT: Some feelings of bloating in the abdomen. Passage of more gas than usual.  Walking can help get rid of the air that was put into your GI tract during the procedure and reduce the bloating. If you had a lower endoscopy (such as a colonoscopy or flexible sigmoidoscopy) you may notice spotting of blood in your stool or on the toilet paper. If you underwent a bowel prep for your procedure, you may not have a normal bowel movement for a few days.  Please Note:  You might notice some irritation and congestion in your nose or some drainage.  This is from the oxygen used during your procedure.  There is no need for concern and it should clear up in a day or so.  SYMPTOMS TO REPORT IMMEDIATELY:  Following lower endoscopy (colonoscopy or flexible sigmoidoscopy):  Excessive amounts of blood in the stool  Significant tenderness or worsening of abdominal pains  Swelling of the abdomen that is new, acute  Fever of 100F or higher  Following upper endoscopy (EGD)  Vomiting of blood or coffee ground material  New chest pain or pain under the shoulder blades  Painful or persistently difficult  swallowing  New shortness of breath  Fever of 100F or higher  Black, tarry-looking stools  For urgent or emergent issues, a gastroenterologist can be reached at any hour by calling [phone]. Do not use MyChart messaging for urgent concerns.    DIET:  We do recommend a small meal at first, but then you may proceed to your regular diet.  Drink plenty of fluids but you should avoid alcoholic beverages for 24 hours.  ACTIVITY:  You should plan to take it easy for the rest of today and you should NOT DRIVE or use heavy machinery until tomorrow (because of the sedation medicines used during the test).    FOLLOW UP: Our staff will call the number listed on your records the next business day following your procedure.  We will call around 7:15- 8:00 am to check on you and address any questions or concerns that you may have regarding the information given to you following your procedure. If we do not reach you, we will leave a message.     If any biopsies were taken you will be contacted by phone or by letter within the next 1-3 weeks.  Please call us at [phone] if you have not heard about the biopsies in 3 weeks.    SIGNATURES/CONFIDENTIALITY: You and/or your care partner have signed paperwork which will be entered into your electronic medical record.  These signatures attest to the fact that that the information above on your After  Visit Summary has been reviewed and is understood.  Full responsibility of the confidentiality of this discharge information lies with you and/or your care-partner.

## 2022-10-24 NOTE — Progress Notes (Signed)
Called to room to assist during endoscopic procedure.  Patient ID and intended procedure confirmed with present staff. Received instructions for my participation in the procedure from the performing physician.  

## 2022-10-24 NOTE — Progress Notes (Signed)
Sedate, gd SR, tolerated procedure well, VSS, report to RN 

## 2022-10-24 NOTE — Progress Notes (Signed)
Patient states there have been no changes to medical or surgical history since time of pre-visit. 

## 2022-10-27 ENCOUNTER — Telehealth: Payer: Self-pay

## 2022-10-27 NOTE — Telephone Encounter (Signed)
  Follow up Call-     10/24/2022    1:30 PM  Call back number  Post procedure Call Back phone  # [phone]  Permission to leave phone message Yes     Patient questions:  Do you have a fever, pain , or abdominal swelling? No. Pain Score  0 *  Have you tolerated food without any problems? yes  Have you been able to return to your normal activities? Yes.    Do you have any questions about your discharge instructions: Diet   No. Medications  No. Follow up visit  No.  Do you have questions or concerns about your Care? No.  Actions: * If pain score is 4 or above: No action needed, pain <4.

## 2022-10-30 ENCOUNTER — Encounter: Payer: Self-pay | Admitting: Gastroenterology

## 2022-11-04 ENCOUNTER — Other Ambulatory Visit (HOSPITAL_COMMUNITY): Payer: Self-pay

## 2022-11-05 ENCOUNTER — Encounter (INDEPENDENT_AMBULATORY_CARE_PROVIDER_SITE_OTHER): Payer: Self-pay

## 2022-11-18 ENCOUNTER — Ambulatory Visit (INDEPENDENT_AMBULATORY_CARE_PROVIDER_SITE_OTHER): Payer: Commercial Managed Care - PPO | Admitting: Family Medicine

## 2022-11-18 ENCOUNTER — Encounter (INDEPENDENT_AMBULATORY_CARE_PROVIDER_SITE_OTHER): Payer: Self-pay | Admitting: Family Medicine

## 2022-11-18 ENCOUNTER — Other Ambulatory Visit (HOSPITAL_COMMUNITY): Payer: Self-pay

## 2022-11-18 VITALS — BP 110/72 | HR 69 | Temp 98.5°F | Ht 65.0 in | Wt 269.0 lb

## 2022-11-18 DIAGNOSIS — Z6841 Body Mass Index (BMI) 40.0 and over, adult: Secondary | ICD-10-CM

## 2022-11-18 DIAGNOSIS — E669 Obesity, unspecified: Secondary | ICD-10-CM | POA: Diagnosis not present

## 2022-11-18 DIAGNOSIS — E559 Vitamin D deficiency, unspecified: Secondary | ICD-10-CM | POA: Diagnosis not present

## 2022-11-18 DIAGNOSIS — F5089 Other specified eating disorder: Secondary | ICD-10-CM | POA: Diagnosis not present

## 2022-11-18 MED ORDER — LOMAIRA 8 MG PO TABS
8.0000 mg | ORAL_TABLET | Freq: Every day | ORAL | 0 refills | Status: DC
Start: 2022-11-18 — End: 2022-12-18
  Filled 2022-11-18: qty 30, 30d supply, fill #0

## 2022-11-18 NOTE — Progress Notes (Signed)
Chief Complaint:   OBESITY Debra Parker is here to discuss her progress with her obesity treatment plan along with follow-up of her obesity related diagnoses. [name] is on keeping a food journal and adhering to recommended goals of 1450-1600 calories and 90+ grams of protein and states she is following her eating plan approximately 90-95% of the time. [name] states she is exercising with a trainer at the gym for 60 minutes 1-2 times per week.  Today's visit was #: 11 Starting weight: 281 lbs Starting date: 01/15/2022 Today's weight: 269 lbs Today's date: 11/18/2022 Total lbs lost to date: 12 Total lbs lost since last in-office visit: 5  Interim History: Patient vacationed in [place] and [place] over the last few weeks.  She was logging but really felt like she struggled with any weight loss.  She is still seeing her trainer 2x a week. She wasn't logging as consistently but is planning to get back on the wagon.  She started tracking again this week. No upcoming plans for the next few weeks; even over Labor Day. She thinks her biggest obstacle will be continuing to track.   Subjective:   1. Vitamin D deficiency Patient is on prescription vitamin D.  Her last vitamin D level was from December 2023.  2. Other disorder of eating Patient started makeshift Lomaira on 6/18 at 282 pounds; down 13 pounds on 8 mg (1 pound to 5%).  PDMP was checked with no concerns.  Assessment/Plan:   1. Vitamin D deficiency Patient is to hold prescription vitamin D until repeat labs.  2. Other disorder of eating We will refill Lomaira 8 mg once daily for 1 month.  No increase in dose as patient is still losing weight.  - Phentermine HCl (LOMAIRA) 8 MG TABS; Take 1 tablet (8 mg total) by mouth daily.  Dispense: 30 tablet; Refill: 0  3. BMI 40.0-44.9, adult (HCC)  4. Obesity  with starting BMI of 46.8 [name] is currently in the action stage of change. As such, her goal is to continue with weight  loss efforts. She has agreed to keeping a food journal and adhering to recommended goals of 1450-1600 calories and 90+ grams of protein daily.   Exercise goals: All adults should avoid inactivity. Some physical activity is better than none, and adults who participate in any amount of physical activity gain some health benefits.  Behavioral modification strategies: increasing lean protein intake, meal planning and cooking strategies, keeping healthy foods in the home, and planning for success.  Debra Parker has agreed to follow-up with our clinic in 3 to 4 weeks. She was informed of the importance of frequent follow-up visits to maximize her success with intensive lifestyle modifications for her multiple health conditions.   Objective:   Blood pressure 110/72, pulse 69, temperature 98.5 F (36.9 C), height 5\' 5"  (1.651 m), weight 269 lb (122 kg), SpO2 98%. Body mass index is 44.76 kg/m.  General: Cooperative, alert, well developed, in no acute distress. HEENT: Conjunctivae and lids unremarkable. Cardiovascular: Regular rhythm.  Lungs: Normal work of breathing. Neurologic: No focal deficits.   Lab Results  Component Value Date   CREATININE 0.67 09/17/2022   BUN 11 09/17/2022   NA 138 09/17/2022   K 4.2 09/17/2022   CL 102 09/17/2022   CO2 28 09/17/2022   Lab Results  Component Value Date   ALT 35 09/17/2022   AST 26 09/17/2022   ALKPHOS 59 09/17/2022   BILITOT 0.4 09/17/2022   Lab Results  Component Value Date   HGBA1C 5.8 (A) 10/01/2022   HGBA1C 5.8 (A) 04/01/2022   HGBA1C 6.0 (A) 12/25/2021   HGBA1C 5.8 (A) 06/27/2020   HGBA1C 5.9 (H) 02/20/2020   Lab Results  Component Value Date   INSULIN 27.8 (H) 01/15/2022   Lab Results  Component Value Date   TSH 2.24 09/17/2022   Lab Results  Component Value Date   CHOL 154 04/01/2022   HDL 41.10 04/01/2022   LDLCALC 93 04/01/2022   TRIG 96.0 04/01/2022   CHOLHDL 4 04/01/2022   Lab Results  Component Value Date   VD25OH  18.25 (L) 04/01/2022   VD25OH 14.82 (L) 12/31/2021   VD25OH 13 (L) 02/20/2020   Lab Results  Component Value Date   WBC 8.0 09/17/2022   HGB 13.1 09/17/2022   HCT 40.2 09/17/2022   MCV 86.3 09/17/2022   PLT 283.0 09/17/2022   Lab Results  Component Value Date   FERRITIN 41.4 11/30/2018   Attestation Statements:   Reviewed by clinician on day of visit: allergies, medications, problem list, medical history, surgical history, family history, social history, and previous encounter notes.   I, Burt Knack, am acting as transcriptionist for Reuben Likes, MD.  I have reviewed the above documentation for accuracy and completeness, and I agree with the above. - Reuben Likes, MD

## 2022-11-20 ENCOUNTER — Other Ambulatory Visit (HOSPITAL_COMMUNITY): Payer: Self-pay

## 2022-11-25 ENCOUNTER — Ambulatory Visit (INDEPENDENT_AMBULATORY_CARE_PROVIDER_SITE_OTHER): Payer: Commercial Managed Care - PPO | Admitting: Family Medicine

## 2022-11-25 ENCOUNTER — Encounter: Payer: Self-pay | Admitting: Family Medicine

## 2022-11-25 VITALS — BP 112/82 | HR 70 | Temp 98.5°F | Ht 64.0 in | Wt 268.5 lb

## 2022-11-25 DIAGNOSIS — Z13220 Encounter for screening for lipoid disorders: Secondary | ICD-10-CM

## 2022-11-25 DIAGNOSIS — Z0000 Encounter for general adult medical examination without abnormal findings: Secondary | ICD-10-CM

## 2022-11-25 LAB — LIPID PANEL
Cholesterol: 168 mg/dL (ref 0–200)
HDL: 35.1 mg/dL — ABNORMAL LOW (ref >39.00)
LDL Cholesterol: 116 mg/dL — ABNORMAL HIGH (ref 0–99)
NonHDL: 133.12
Total CHOL/HDL Ratio: 5
Triglycerides: 86 mg/dL (ref 0.0–149.0)
VLDL: 17.2 mg/dL (ref 0.0–40.0)

## 2022-11-25 NOTE — Patient Instructions (Signed)

## 2022-11-25 NOTE — Progress Notes (Signed)
Complete physical exam  Patient: Debra Parker   DOB: [birth year]   37 y.o. Female  MRN: [id number]  Subjective:    Chief Complaint  Patient presents with   Annual Exam    Debra Parker is a 37 y.o. female who presents today for a complete physical exam. She reports consuming a  low carb low calorie  diet. Gym/ health club routine includes works with a trainer 3 days a week. She generally feels well. She reports sleeping well. She does not have additional problems to discuss today.    Most recent fall risk assessment:     No data to display           Most recent depression screenings:    11/25/2022    8:01 AM 10/01/2022    8:10 AM  PHQ 2/9 Scores  PHQ - 2 Score 0 0  PHQ- 9 Score 0     Vision:Within last year and [facility], wears glasses and Dental: No current dental problems and Receives regular dental care  Patient Active Problem List   Diagnosis Date Noted   Chronic bronchitis (HCC) 10/01/2022   Vitamin D deficiency 12/31/2021   Prediabetes 12/25/2021   OSA (obstructive sleep apnea) 12/25/2021   Lichen planopilaris 02/19/2020   Migraine 02/17/2020   Morbid obesity (HCC) 02/01/2018      Patient Care Team: Karie Georges, MD as PCP - General (Family Medicine) Ob/Gyn, [facility] (Obstetrics and Gynecology) Dermatology, [facility]   Outpatient Medications Prior to Visit  Medication Sig   acetaminophen (TYLENOL) 500 MG tablet Take 500 mg by mouth every 6 (six) hours as needed for mild pain.   albuterol (VENTOLIN HFA) 108 (90 Base) MCG/ACT inhaler Inhale 2 puffs into the lungs every 6 (six) hours as needed for wheezing or shortness of breath.   amitriptyline (ELAVIL) 25 MG tablet Take 1 tablet (25 mg total) by mouth at bedtime.   cyanocobalamin (VITAMIN B12) 1000 MCG/ML injection Inject 1 mL (1,000 mcg total) into the muscle every 30 (thirty) days.   famotidine (PEPCID) 20 MG tablet Take 1 tablet (20 mg total) by mouth 2 (two)  times daily.   furosemide (LASIX) 20 MG tablet Take 0.5 tablets (10 mg total) by mouth daily as needed.   hydrocortisone (ANUSOL-HC) 2.5 % rectal cream Place 1 Application rectally 2 (two) times daily. Apply 2 times a day after BM for 10 days   Insulin Pen Needle (BD PEN NEEDLE NANO 2ND GEN) 32G X 4 MM MISC Use as directed 2 (two) times daily.   methylPREDNISolone (MEDROL DOSEPAK) 4 MG TBPK tablet Take package as directed. Instructions on package   metoCLOPramide (REGLAN) 10 MG tablet Take 1 tablet (10 mg total) by mouth every 6 (six) hours.   ondansetron (ZOFRAN-ODT) 4 MG disintegrating tablet Dissolve 1 tablet (4 mg total) in mouth every 8 (eight) hours as needed for nausea or vomiting.   pantoprazole (PROTONIX) 40 MG tablet Take 1 tablet (40 mg total) by mouth daily.   Phentermine HCl (LOMAIRA) 8 MG TABS Take 1 tablet (8 mg total) by mouth daily.   prenatal vitamin w/FE, FA (PRENATAL 1 + 1) 27-1 MG TABS tablet Take 1 tablet by mouth daily.   promethazine (PHENERGAN) 12.5 MG tablet Take 1 tablet (12.5 mg total) by mouth every 8 (eight) hours as needed for nausea or vomiting.   topiramate (TOPAMAX) 50 MG tablet Take 3 tablets (150 mg total) by mouth 2 (two) times daily in the morning  and at bedtime   Vitamin D, Ergocalciferol, (DRISDOL) 1.25 MG (50000 UNIT) CAPS capsule Take 1 capsule (50,000 Units total) by mouth every 7 (seven) days.   levonorgestrel (MIRENA, 52 MG,) 20 MCG/24HR IUD 1 Intra Uterine Device (1 each total) by Intrauterine route once for 1 dose.   No facility-administered medications prior to visit.    Review of Systems  HENT:  Negative for hearing loss.   Eyes:  Negative for blurred vision.  Respiratory:  Negative for shortness of breath.   Cardiovascular:  Negative for chest pain.  Gastrointestinal: Negative.   Genitourinary: Negative.   Musculoskeletal:  Negative for back pain.  Neurological:  Negative for headaches.  Psychiatric/Behavioral:  Negative for depression.    All other systems reviewed and are negative.      Objective:     BP 112/82 (BP Location: Left Arm, Patient Position: Sitting, Cuff Size: Large)   Pulse 70   Temp 98.5 F (36.9 C) (Oral)   Ht 5\' 4"  (1.626 m)   Wt 268 lb 8 oz (121.8 kg)   SpO2 98%   BMI 46.09 kg/m    Physical Exam Vitals reviewed.  Constitutional:      Appearance: Normal appearance. She is well-groomed. She is morbidly obese.  HENT:     Right Ear: Tympanic membrane and ear canal normal.     Left Ear: Tympanic membrane and ear canal normal.     Mouth/Throat:     Mouth: Mucous membranes are moist.     Pharynx: No posterior oropharyngeal erythema.  Eyes:     Conjunctiva/sclera: Conjunctivae normal.  Neck:     Thyroid: No thyromegaly.  Cardiovascular:     Rate and Rhythm: Normal rate and regular rhythm.     Pulses: Normal pulses.     Heart sounds: S1 normal and S2 normal.  Pulmonary:     Effort: Pulmonary effort is normal.     Breath sounds: Normal breath sounds and air entry.  Abdominal:     General: Abdomen is flat. Bowel sounds are normal.     Palpations: Abdomen is soft.  Musculoskeletal:     Right lower leg: No edema.     Left lower leg: No edema.  Lymphadenopathy:     Cervical: No cervical adenopathy.  Neurological:     Mental Status: She is alert and oriented to person, place, and time. Mental status is at baseline.     Gait: Gait is intact.  Psychiatric:        Mood and Affect: Mood and affect normal.        Speech: Speech normal.        Behavior: Behavior normal.        Judgment: Judgment normal.      No results found for any visits on 11/25/22. Last CBC Lab Results  Component Value Date   WBC 8.0 09/17/2022   HGB 13.1 09/17/2022   HCT 40.2 09/17/2022   MCV 86.3 09/17/2022   MCH 28.3 05/19/2022   RDW 13.4 09/17/2022   PLT 283.0 09/17/2022   Last metabolic panel Lab Results  Component Value Date   GLUCOSE 97 09/17/2022   NA 138 09/17/2022   K 4.2 09/17/2022   CL 102  09/17/2022   CO2 28 09/17/2022   BUN 11 09/17/2022   CREATININE 0.67 09/17/2022   GFR 110.20 09/17/2022   CALCIUM 9.2 09/17/2022   PROT 7.8 09/17/2022   ALBUMIN 4.1 09/17/2022   BILITOT 0.4 09/17/2022   ALKPHOS 59 09/17/2022  AST 26 09/17/2022   ALT 35 09/17/2022   ANIONGAP 7 05/19/2022   Last hemoglobin A1c Lab Results  Component Value Date   HGBA1C 5.8 (A) 10/01/2022        Assessment & Plan:    Routine Health Maintenance and Physical Exam  Immunization History  Administered Date(s) Administered   Influenza,inj,Quad PF,6+ Mos 01/25/2014   Influenza-Unspecified 01/06/2016, 12/30/2018, 01/17/2020, 01/10/2021   Moderna Sars-Covid-2 Vaccination 06/11/2019, 07/13/2019   Unspecified SARS-COV-2 Vaccination 07/07/2019, 08/06/2019    Health Maintenance  Topic Date Due   DTaP/Tdap/Td (1 - Tdap) Never done   INFLUENZA VACCINE  11/06/2022   COVID-19 Vaccine (5 - 2023-24 season) 12/11/2022 (Originally 12/06/2021)   PAP SMEAR-Modifier  01/01/2023 (Originally 04/07/2021)   Hepatitis C Screening  Completed   HIV Screening  Completed   HPV VACCINES  Aged Out    Discussed health benefits of physical activity, and encouraged her to engage in regular exercise appropriate for her age and condition.  Routine general medical examination at a health care facility  - Normal physical exam findings today-- pt already had bloodwork done in June which was normal, will check new lipid panel today, reviewed last lipid panel which was also normal. Counseled patient on continuing her diet and exercise regimen, handouts given. RTC 1 year for annual physical or sooner if needd.   Lipid screening -     Lipid panel    Return in 1 year (on 11/25/2023).     Karie Georges, MD

## 2022-12-18 ENCOUNTER — Encounter (INDEPENDENT_AMBULATORY_CARE_PROVIDER_SITE_OTHER): Payer: Self-pay | Admitting: Internal Medicine

## 2022-12-18 ENCOUNTER — Other Ambulatory Visit (HOSPITAL_COMMUNITY): Payer: Self-pay

## 2022-12-18 ENCOUNTER — Ambulatory Visit (INDEPENDENT_AMBULATORY_CARE_PROVIDER_SITE_OTHER): Payer: Commercial Managed Care - PPO | Admitting: Internal Medicine

## 2022-12-18 DIAGNOSIS — F5089 Other specified eating disorder: Secondary | ICD-10-CM | POA: Diagnosis not present

## 2022-12-18 DIAGNOSIS — G43809 Other migraine, not intractable, without status migrainosus: Secondary | ICD-10-CM | POA: Diagnosis not present

## 2022-12-18 DIAGNOSIS — Z6841 Body Mass Index (BMI) 40.0 and over, adult: Secondary | ICD-10-CM | POA: Diagnosis not present

## 2022-12-18 MED ORDER — LOMAIRA 8 MG PO TABS
8.0000 mg | ORAL_TABLET | Freq: Every day | ORAL | 0 refills | Status: DC
Start: 2022-12-18 — End: 2023-01-20
  Filled 2022-12-18: qty 30, 30d supply, fill #0

## 2022-12-18 MED ORDER — TOPIRAMATE 50 MG PO TABS
150.0000 mg | ORAL_TABLET | Freq: Two times a day (BID) | ORAL | 0 refills | Status: DC
Start: 2022-12-18 — End: 2023-07-08
  Filled 2022-12-18 – 2023-02-03 (×2): qty 180, 30d supply, fill #0

## 2022-12-18 NOTE — Assessment & Plan Note (Signed)
Stable.  Patient requesting refill on topiramate.  We provided refill for 30 days.  I recommend checking a CMP and magnesium prior to next refill, as this medication may cause to metabolic acidosis and hypomagnesemia.

## 2022-12-18 NOTE — Assessment & Plan Note (Signed)
Since starting phentermine patient has lost approximately 7% of total body weight which is an adequate response and is not experiencing side effects.  Blood pressure and heart rate are within normal limits.  She will continue on Lomaira 8 mg once a day and topiramate 150 mg twice a day.  See obesity treatment plan

## 2022-12-18 NOTE — Progress Notes (Signed)
Office: [phone]  /  Fax: [phone]  WEIGHT SUMMARY AND BIOMETRICS  Vitals Temp: 98.1 F (36.7 C) BP: 107/70 Pulse Rate: 66 SpO2: 99 %   Anthropometric Measurements Height: 5\' 4"  (1.626 m) Weight: 264 lb (119.7 kg) BMI (Calculated): 45.29 Weight at Last Visit: 269 lb Weight Lost Since Last Visit: 5 lb Weight Gained Since Last Visit: 0 lb Starting Weight: 281 lb Total Weight Loss (lbs): 17 lb (7.711 kg)   Body Composition  Body Fat %: 50.5 % Fat Mass (lbs): 133.6 lbs Muscle Mass (lbs): 124.4 lbs Total Body Water (lbs): 96.8 lbs Visceral Fat Rating : 15    No data recorded Today's Visit #: 13  Starting Date: 01/15/22   HPI  Chief Complaint: OBESITY  Debra Parker is here to discuss her progress with her obesity treatment plan. She is on the the Category 3 Plan and states she is following her eating plan approximately 90 % of the time. She states she is exercising 60 minutes 3 times per week.  Interval History:  This is my first encounter with Ms. Debra Parker.  She is a pleasant 37 year old female who is affected by obesity, chronic migraine headaches and obstructive sleep apnea.  She is following a reduced calorie nutrition plan of about 1500 cal.  She is tracking and journaling and is also exercising focusing on strengthening with a certified trainer.  Since last office visit she has lost 5 pounds.  She is also on phentermine and topiramate.  The latter for weight management and migraine prevention.  She denies any adverse effect to medications.  She has an IUD. She has been working on not skipping meals, increasing protein intake at every meal, drinking more water, making healthier choices, and continues to exercise  Orexigenic Control: Denies problems with appetite and hunger signals.  Denies problems with satiety and satiation.  Denies problems with eating patterns and portion control.  Denies abnormal cravings. Denies feeling deprived or restricted.    Barriers identified: presence of obesogenic drugs.   Pharmacotherapy for weight loss: She is currently taking Topiramate (off label use, single agent) with adequate clinical response  and without side effects. and Phentermine (longterm use, single agent)  with adequate clinical response  and without side effects..    ASSESSMENT AND PLAN  TREATMENT PLAN FOR OBESITY:  Recommended Dietary Goals  Revia is currently in the action stage of change. As such, her goal is to continue weight management plan. She has agreed to: continue current plan  Behavioral Intervention  We discussed the following Behavioral Modification Strategies today: increasing fiber rich foods, increasing water intake, continue to practice mindfulness when eating, and planning for success.  Additional resources provided today: None  Recommended Physical Activity Goals  Indyia has been advised to work up to 150 minutes of moderate intensity aerobic activity a week and strengthening exercises 2-3 times per week for cardiovascular health, weight loss maintenance and preservation of muscle mass.   She has agreed to :  Continue current level of physical activity   Pharmacotherapy We discussed various medication options to help Niyanna with her weight loss efforts and we both agreed to : continue current anti-obesity medication regimen and medication refill .  Patient counseled on risk associated with topiramate in pregnancy.  I also recommend checking a CMP and magnesium at the next office visit to monitor topiramate.  ASSOCIATED CONDITIONS ADDRESSED TODAY  Morbid obesity (HCC) Assessment & Plan: Since starting phentermine patient has lost approximately 7% of total body weight which  is an adequate response and is not experiencing side effects.  Blood pressure and heart rate are within normal limits.  She will continue on Lomaira 8 mg once a day and topiramate 150 mg twice a day.  See obesity treatment  plan   Other migraine without status migrainosus, not intractable Assessment & Plan: Stable.  Patient requesting refill on topiramate.  We provided refill for 30 days.  I recommend checking a CMP and magnesium prior to next refill, as this medication may cause to metabolic acidosis and hypomagnesemia.  Orders: -     Topiramate; Take 3 tablets (150 mg total) by mouth 2 (two) times daily in the morning and at bedtime  Dispense: 180 tablet; Refill: 0  Other disorder of eating -     Lomaira; Take 1 tablet (8 mg total) by mouth daily.  Dispense: 30 tablet; Refill: 0    PHYSICAL EXAM:  Blood pressure 107/70, pulse 66, temperature 98.1 F (36.7 C), height 5\' 4"  (1.626 m), weight 264 lb (119.7 kg), SpO2 99%. Body mass index is 45.32 kg/m.  General: She is overweight, cooperative, alert, well developed, and in no acute distress. PSYCH: Has normal mood, affect and thought process.   HEENT: EOMI, sclerae are anicteric. Lungs: Normal breathing effort, no conversational dyspnea. Extremities: No edema.  Neurologic: No gross sensory or motor deficits. No tremors or fasciculations noted.    DIAGNOSTIC DATA REVIEWED:  BMET    Component Value Date/Time   NA 138 09/17/2022 0913   K 4.2 09/17/2022 0913   CL 102 09/17/2022 0913   CO2 28 09/17/2022 0913   GLUCOSE 97 09/17/2022 0913   BUN 11 09/17/2022 0913   CREATININE 0.67 09/17/2022 0913   CREATININE 0.64 02/20/2020 1113   CALCIUM 9.2 09/17/2022 0913   GFRNONAA >60 05/19/2022 0750   GFRAA >60 10/18/2017 1416   Lab Results  Component Value Date   HGBA1C 5.8 (A) 10/01/2022   HGBA1C 5.9 (H) 02/20/2020   Lab Results  Component Value Date   INSULIN 27.8 (H) 01/15/2022   Lab Results  Component Value Date   TSH 2.24 09/17/2022   CBC    Component Value Date/Time   WBC 8.0 09/17/2022 0913   RBC 4.66 09/17/2022 0913   HGB 13.1 09/17/2022 0913   HGB 13.0 01/15/2022 0858   HCT 40.2 09/17/2022 0913   HCT 41.6 01/15/2022 0858   PLT  283.0 09/17/2022 0913   PLT 299 01/15/2022 0858   MCV 86.3 09/17/2022 0913   MCV 87 01/15/2022 0858   MCH 28.3 05/19/2022 0750   MCHC 32.6 09/17/2022 0913   RDW 13.4 09/17/2022 0913   RDW 13.2 01/15/2022 0858   Iron Studies    Component Value Date/Time   FERRITIN 41.4 11/30/2018 1545   Lipid Panel     Component Value Date/Time   CHOL 168 11/25/2022 0828   TRIG 86.0 11/25/2022 0828   HDL 35.10 (L) 11/25/2022 0828   CHOLHDL 5 11/25/2022 0828   VLDL 17.2 11/25/2022 0828   LDLCALC 116 (H) 11/25/2022 0828   LDLCALC 82 02/20/2020 1113   Hepatic Function Panel     Component Value Date/Time   PROT 7.8 09/17/2022 0913   ALBUMIN 4.1 09/17/2022 0913   AST 26 09/17/2022 0913   ALT 35 09/17/2022 0913   ALKPHOS 59 09/17/2022 0913   BILITOT 0.4 09/17/2022 0913      Component Value Date/Time   TSH 2.24 09/17/2022 0913   Nutritional Lab Results  Component Value Date  VD25OH 18.25 (L) 04/01/2022   VD25OH 14.82 (L) 12/31/2021   VD25OH 13 (L) 02/20/2020     Return for 3-4 weeks with Dr. Lawson Radar.. She was informed of the importance of frequent follow up visits to maximize her success with intensive lifestyle modifications for her multiple health conditions.   ATTESTASTION STATEMENTS:  Reviewed by clinician on day of visit: allergies, medications, problem list, medical history, surgical history, family history, social history, and previous encounter notes.     Worthy Rancher, MD

## 2023-01-20 ENCOUNTER — Encounter (INDEPENDENT_AMBULATORY_CARE_PROVIDER_SITE_OTHER): Payer: Self-pay | Admitting: Family Medicine

## 2023-01-20 ENCOUNTER — Other Ambulatory Visit (HOSPITAL_COMMUNITY): Payer: Self-pay

## 2023-01-20 ENCOUNTER — Telehealth (INDEPENDENT_AMBULATORY_CARE_PROVIDER_SITE_OTHER): Payer: Commercial Managed Care - PPO | Admitting: Family Medicine

## 2023-01-20 DIAGNOSIS — F5089 Other specified eating disorder: Secondary | ICD-10-CM

## 2023-01-20 DIAGNOSIS — E7849 Other hyperlipidemia: Secondary | ICD-10-CM | POA: Diagnosis not present

## 2023-01-20 DIAGNOSIS — E669 Obesity, unspecified: Secondary | ICD-10-CM

## 2023-01-20 DIAGNOSIS — Z6841 Body Mass Index (BMI) 40.0 and over, adult: Secondary | ICD-10-CM | POA: Diagnosis not present

## 2023-01-20 MED ORDER — LOMAIRA 8 MG PO TABS
12.0000 mg | ORAL_TABLET | Freq: Every day | ORAL | 0 refills | Status: DC
Start: 2023-01-20 — End: 2023-02-19
  Filled 2023-01-20: qty 45, 30d supply, fill #0

## 2023-01-20 NOTE — Progress Notes (Unsigned)
TeleHealth Visit:  Due to the COVID-19 pandemic, this visit was completed with telemedicine (audio/video) technology to reduce patient and provider exposure as well as to preserve personal protective equipment.   Dandrea has verbally consented to this TeleHealth visit. The patient is located at home, the provider is located at the [facility] office. The participants in this visit include the listed provider and patient. The visit was conducted today via MyChart video.   Chief Complaint: OBESITY Dejanay is here to discuss her progress with her obesity treatment plan along with follow-up of her obesity related diagnoses. Ladaysha is on the Category 3 Plan and states she is following her eating plan approximately 90% of the time. Markiyah states she is at the gym for 60 minutes 2 times per week.  Today's visit was #: 13 Starting weight: 281 lbs Starting date: 01/15/2022  Interim History: Patient has been dealing with more stress.  Her child is home ill currently.  She is also having other stress with her child. Her last appointment was with Dr. Rikki Spearing and she realizes that since that appointment she hasn't been as consistent with logging food.  She mentions that she wants to get back to charting her food to ensure she is staying within her calories and getting her protein.  She has been doing some of the higher protein breakfasts at work to stay mindful. She is working on getting some of the stressors in her life under control so she can spend more time and focus on plan. She does have some support with what is going on. No upcoming plans for events or activities or travel.  Subjective:   1. Other disorder of eating Patient is feeling increase in hunger and the desire to snack.  Her starting weight was of 282 pounds and last in office visit her weight was of 264 pounds.  2. Other hyperlipidemia Patient is not on medications.  Her last LDL was 116, HDL 35.1, and triglycerides  86.  Assessment/Plan:   1. Other disorder of eating Patient agreed to increase Lomaira to 12 mg daily, and we will refill for 1 month.  - Phentermine HCl (LOMAIRA) 8 MG TABS; Take 1&1/2 tablets (12 mg total) by mouth daily.  Dispense: 45 tablet; Refill: 0  2. Other hyperlipidemia We will repeat labs in January.  3. BMI 45.0-49.9, adult (HCC)  4. Obesity with current BMI of 46.5 Surena is currently in the action stage of change. As such, her goal is to continue with weight loss efforts. She has agreed to keeping a food journal and adhering to recommended goals of 1450-1600 calories and 90+ grams of protein daily.   Exercise goals: All adults should avoid inactivity. Some physical activity is better than none, and adults who participate in any amount of physical activity gain some health benefits.  Behavioral modification strategies: increasing lean protein intake, meal planning and cooking strategies, keeping healthy foods in the home, and planning for success.  Saleemah has agreed to follow-up with our clinic in 4 weeks. She was informed of the importance of frequent follow-up visits to maximize her success with intensive lifestyle modifications for her multiple health conditions.  Objective:   VITALS: Per patient if applicable, see vitals. GENERAL: Alert and in no acute distress. CARDIOPULMONARY: No increased WOB. Speaking in clear sentences.  PSYCH: Pleasant and cooperative. Speech normal rate and rhythm. Affect is appropriate. Insight and judgement are appropriate. Attention is focused, linear, and appropriate.  NEURO: Oriented as arrived  to appointment on time with no prompting.   Lab Results  Component Value Date   CREATININE 0.67 09/17/2022   BUN 11 09/17/2022   NA 138 09/17/2022   K 4.2 09/17/2022   CL 102 09/17/2022   CO2 28 09/17/2022   Lab Results  Component Value Date   ALT 35 09/17/2022   AST 26 09/17/2022   ALKPHOS 59 09/17/2022   BILITOT 0.4 09/17/2022    Lab Results  Component Value Date   HGBA1C 5.8 (A) 10/01/2022   HGBA1C 5.8 (A) 04/01/2022   HGBA1C 6.0 (A) 12/25/2021   HGBA1C 5.8 (A) 06/27/2020   HGBA1C 5.9 (H) 02/20/2020   Lab Results  Component Value Date   INSULIN 27.8 (H) 01/15/2022   Lab Results  Component Value Date   TSH 2.24 09/17/2022   Lab Results  Component Value Date   CHOL 168 11/25/2022   HDL 35.10 (L) 11/25/2022   LDLCALC 116 (H) 11/25/2022   TRIG 86.0 11/25/2022   CHOLHDL 5 11/25/2022   Lab Results  Component Value Date   VD25OH 18.25 (L) 04/01/2022   VD25OH 14.82 (L) 12/31/2021   VD25OH 13 (L) 02/20/2020   Lab Results  Component Value Date   WBC 8.0 09/17/2022   HGB 13.1 09/17/2022   HCT 40.2 09/17/2022   MCV 86.3 09/17/2022   PLT 283.0 09/17/2022   Lab Results  Component Value Date   FERRITIN 41.4 11/30/2018    Attestation Statements:   Reviewed by clinician on day of visit: allergies, medications, problem list, medical history, surgical history, family history, social history, and previous encounter notes.   I, Burt Knack, am acting as transcriptionist for Reuben Likes, MD.  I have reviewed the above documentation for accuracy and completeness, and I agree with the above. - Reuben Likes, MD

## 2023-02-02 ENCOUNTER — Other Ambulatory Visit (HOSPITAL_COMMUNITY): Payer: Self-pay

## 2023-02-02 DIAGNOSIS — M25512 Pain in left shoulder: Secondary | ICD-10-CM | POA: Diagnosis not present

## 2023-02-02 MED ORDER — PREDNISONE 5 MG (21) PO TBPK
ORAL_TABLET | ORAL | 0 refills | Status: DC
  Filled 2023-02-02: qty 21, 6d supply, fill #0

## 2023-02-03 ENCOUNTER — Other Ambulatory Visit: Payer: Self-pay

## 2023-02-03 ENCOUNTER — Other Ambulatory Visit (HOSPITAL_COMMUNITY): Payer: Self-pay

## 2023-02-18 ENCOUNTER — Ambulatory Visit (INDEPENDENT_AMBULATORY_CARE_PROVIDER_SITE_OTHER): Payer: Commercial Managed Care - PPO | Admitting: Family Medicine

## 2023-02-18 ENCOUNTER — Other Ambulatory Visit (HOSPITAL_COMMUNITY): Payer: Self-pay

## 2023-02-18 DIAGNOSIS — M25512 Pain in left shoulder: Secondary | ICD-10-CM | POA: Diagnosis not present

## 2023-02-18 MED ORDER — DICLOFENAC SODIUM 75 MG PO TBEC
75.0000 mg | DELAYED_RELEASE_TABLET | Freq: Two times a day (BID) | ORAL | 1 refills | Status: DC
  Filled 2023-02-18: qty 60, 30d supply, fill #0

## 2023-02-19 ENCOUNTER — Encounter (INDEPENDENT_AMBULATORY_CARE_PROVIDER_SITE_OTHER): Payer: Self-pay | Admitting: Family Medicine

## 2023-02-19 ENCOUNTER — Ambulatory Visit (INDEPENDENT_AMBULATORY_CARE_PROVIDER_SITE_OTHER): Payer: Commercial Managed Care - PPO | Admitting: Family Medicine

## 2023-02-19 ENCOUNTER — Other Ambulatory Visit (HOSPITAL_COMMUNITY): Payer: Self-pay

## 2023-02-19 VITALS — BP 107/72 | HR 69 | Temp 98.1°F | Ht 64.0 in | Wt 263.0 lb

## 2023-02-19 DIAGNOSIS — E669 Obesity, unspecified: Secondary | ICD-10-CM | POA: Diagnosis not present

## 2023-02-19 DIAGNOSIS — Z6841 Body Mass Index (BMI) 40.0 and over, adult: Secondary | ICD-10-CM | POA: Diagnosis not present

## 2023-02-19 DIAGNOSIS — E66813 Obesity, class 3: Secondary | ICD-10-CM

## 2023-02-19 DIAGNOSIS — F5089 Other specified eating disorder: Secondary | ICD-10-CM

## 2023-02-19 MED ORDER — LOMAIRA 8 MG PO TABS
12.0000 mg | ORAL_TABLET | Freq: Every day | ORAL | 0 refills | Status: DC
  Filled 2023-02-19: qty 45, 30d supply, fill #0

## 2023-02-19 MED ORDER — DICLOFENAC SODIUM 75 MG PO TBEC
75.0000 mg | DELAYED_RELEASE_TABLET | Freq: Two times a day (BID) | ORAL | 1 refills | Status: DC
  Filled 2023-02-19: qty 60, 30d supply, fill #0
  Filled 2023-04-21: qty 60, 30d supply, fill #1

## 2023-02-19 NOTE — Progress Notes (Signed)
Carlye Grippe, D.O.  ABFM, ABOM Specializing in Clinical Bariatric Medicine  Office located at: [address], [place]  [address]   Assessment and Plan:   FOR THE DISEASE OF OBESITY: BMI 45.0-49.9, adult (HCC) - current BMI 45.14 Obesity with starting BMI of 46.76  Since last office visit on 12/18/22 patient's muscle mass has increased by 3 lb. Fat mass has decreased by 4 lb. Total body water has decreased by 2.2 lb.  Counseling done on how various foods will affect these numbers and how to maximize success  Total lbs lost to date: 18 lbs  Total weight loss percentage to date: 6.41    Recommended Dietary Goals Debra Parker is currently in the action stage of change. As such, her goal is to continue weight management plan.  She has agreed to: continue current plan   Behavioral Intervention We discussed the following Behavioral Modification Strategies today: continue to work on tracking and journaling calories using tracking application  Additional resources provided today: Handout on holiday eating strategies and handout on food journaling log  Evidence-based interventions for health behavior change were utilized today including the discussion of self monitoring techniques, problem-solving barriers and SMART goal setting techniques.   Regarding patient's less desirable eating habits and patterns, we employed the technique of small changes.   Pt will specifically work on: n/a   Recommended Physical Activity Goals Debra Parker has been advised to work up to 150 minutes of moderate intensity aerobic activity a week and strengthening exercises 2-3 times per week for cardiovascular health, weight loss maintenance and preservation of muscle mass.   She has agreed to : Continue current level of physical activity    Pharmacotherapy We both agreed to : continue current anti-obesity medication regimen   FOR ASSOCIATED CONDITIONS ADDRESSED TODAY:  Other disorder of  eating Assessment & Plan: Current medication: Lomaira 12 mg daily. Pt tolerating medication well, denies any SE. No c/o hunger and cravings today. PDMP reviewed and no aberrancies noted.   Continue with Lomaira at current dose. Reminded pt that having adequate amounts of protein is important for controlling hunger/cravings and improving metabolism. Continue weight loss therapy via [facility].   Orders: Debra Parker; Take 1&1/2 tablets (12 mg total) by mouth daily.  Dispense: 45 tablet; Refill: 0   FOLLOW UP: Return 03/18/23. She was informed of the importance of frequent follow up visits to maximize her success with intensive lifestyle modifications for her multiple health conditions.  Subjective:   Chief complaint: Obesity Debra Parker is here to discuss her progress with her obesity treatment plan. She is keeping a food journal and adhering to recommended goals of 1450-1600 calories and 90+ protein and states she is following her eating plan approximately 80% of the time. She states she is going to the gym 60 minutes 2 days per week.  Interval History:  Debra Parker is here for a follow up office visit. First encounter with pt; she is pt of Dr.Ukleja. Since last OV, she is down 1 lb. Was on prednisone after hurting her shoulder, now recovering well. Has been more protein focused, but admits to not measuring protein intake.   Barriers identified: none  Pharmacotherapy for weight loss: She is currently taking  Lomaira 12 mg daily & Topiramate 150 mg bid.  Review of Systems:  Pertinent positives were addressed with patient today.  Reviewed by clinician on day of visit: allergies, medications, problem list, medical history, surgical history, family history, social  history, and previous encounter notes.  Weight Summary and Biometrics   Weight Lost Since Last Visit: 1lb  Weight Gained Since Last Visit: 0lb   Vitals Temp: 98.1 F (36.7 C) BP: 107/72 Pulse Rate: 69 SpO2: 98  %   Anthropometric Measurements Height: 5\' 4"  (1.626 m) Weight: 263 lb (119.3 kg) BMI (Calculated): 45.12 Weight at Last Visit: 264lb Weight Lost Since Last Visit: 1lb Weight Gained Since Last Visit: 0lb Starting Weight: 281lb Total Weight Loss (lbs): 18 lb (8.165 kg)   Body Composition  Body Fat %: 49.1 % Fat Mass (lbs): 129.6 lbs Muscle Mass (lbs): 127.4 lbs Total Body Water (lbs): 94.6 lbs Visceral Fat Rating : 15   Other Clinical Data Fasting: no Labs: no Today's Visit #: 13 Starting Date: 01/15/22   Objective:   PHYSICAL EXAM: Blood pressure 107/72, pulse 69, temperature 98.1 F (36.7 C), height 5\' 4"  (1.626 m), weight 263 lb (119.3 kg), SpO2 98%. Body mass index is 45.14 kg/m.  General: she is overweight, cooperative and in no acute distress. PSYCH: Has normal mood, affect and thought process.   HEENT: EOMI, sclerae are anicteric. Lungs: Normal breathing effort, no conversational dyspnea. Extremities: Moves * 4 Neurologic: A and O * 3, good insight  DIAGNOSTIC DATA REVIEWED: BMET    Component Value Date/Time   NA 138 09/17/2022 0913   K 4.2 09/17/2022 0913   CL 102 09/17/2022 0913   CO2 28 09/17/2022 0913   GLUCOSE 97 09/17/2022 0913   BUN 11 09/17/2022 0913   CREATININE 0.67 09/17/2022 0913   CREATININE 0.64 02/20/2020 1113   CALCIUM 9.2 09/17/2022 0913   GFRNONAA >60 05/19/2022 0750   GFRAA >60 10/18/2017 1416   Lab Results  Component Value Date   HGBA1C 5.8 (A) 10/01/2022   HGBA1C 5.9 (H) 02/20/2020   Lab Results  Component Value Date   INSULIN 27.8 (H) 01/15/2022   Lab Results  Component Value Date   TSH 2.24 09/17/2022   CBC    Component Value Date/Time   WBC 8.0 09/17/2022 0913   RBC 4.66 09/17/2022 0913   HGB 13.1 09/17/2022 0913   HGB 13.0 01/15/2022 0858   HCT 40.2 09/17/2022 0913   HCT 41.6 01/15/2022 0858   PLT 283.0 09/17/2022 0913   PLT 299 01/15/2022 0858   MCV 86.3 09/17/2022 0913   MCV 87 01/15/2022 0858   MCH  28.3 05/19/2022 0750   MCHC 32.6 09/17/2022 0913   RDW 13.4 09/17/2022 0913   RDW 13.2 01/15/2022 0858   Iron Studies    Component Value Date/Time   FERRITIN 41.4 11/30/2018 1545   Lipid Panel     Component Value Date/Time   CHOL 168 11/25/2022 0828   TRIG 86.0 11/25/2022 0828   HDL 35.10 (L) 11/25/2022 0828   CHOLHDL 5 11/25/2022 0828   VLDL 17.2 11/25/2022 0828   LDLCALC 116 (H) 11/25/2022 0828   LDLCALC 82 02/20/2020 1113   Hepatic Function Panel     Component Value Date/Time   PROT 7.8 09/17/2022 0913   ALBUMIN 4.1 09/17/2022 0913   AST 26 09/17/2022 0913   ALT 35 09/17/2022 0913   ALKPHOS 59 09/17/2022 0913   BILITOT 0.4 09/17/2022 0913      Component Value Date/Time   TSH 2.24 09/17/2022 0913   Nutritional Lab Results  Component Value Date   VD25OH 18.25 (L) 04/01/2022   VD25OH 14.82 (L) 12/31/2021   VD25OH 13 (L) 02/20/2020    Attestations:   I, Special  Randolm Idol, acting as a Stage manager for [employer], DO., have compiled all relevant documentation for today's office visit on behalf of Thomasene Lot, DO, while in the presence of [employer], DO.  I have reviewed the above documentation for accuracy and completeness, and I agree with the above. Carlye Grippe, D.O.  The 21st Century Cures Act was signed into law in 2016 which includes the topic of electronic health records.  This provides immediate access to information in MyChart.  This includes consultation notes, operative notes, office notes, lab results and pathology reports.  If you have any questions about what you read please let us know at your next visit so we can discuss your concerns and take corrective action if need be.  We are right here with you.

## 2023-02-20 ENCOUNTER — Other Ambulatory Visit (HOSPITAL_COMMUNITY): Payer: Self-pay

## 2023-02-20 ENCOUNTER — Other Ambulatory Visit: Payer: Self-pay

## 2023-02-20 MED ORDER — DICLOFENAC SODIUM 75 MG PO TBEC
75.0000 mg | DELAYED_RELEASE_TABLET | Freq: Two times a day (BID) | ORAL | 1 refills | Status: DC
  Filled 2023-02-20: qty 60, 30d supply, fill #0

## 2023-03-18 ENCOUNTER — Ambulatory Visit (INDEPENDENT_AMBULATORY_CARE_PROVIDER_SITE_OTHER): Payer: Commercial Managed Care - PPO | Admitting: Family Medicine

## 2023-03-18 ENCOUNTER — Other Ambulatory Visit (HOSPITAL_COMMUNITY): Payer: Self-pay

## 2023-03-18 ENCOUNTER — Encounter (INDEPENDENT_AMBULATORY_CARE_PROVIDER_SITE_OTHER): Payer: Self-pay | Admitting: Family Medicine

## 2023-03-18 VITALS — BP 109/73 | HR 71 | Temp 98.3°F | Ht 64.0 in | Wt 260.0 lb

## 2023-03-18 DIAGNOSIS — F5089 Other specified eating disorder: Secondary | ICD-10-CM | POA: Diagnosis not present

## 2023-03-18 DIAGNOSIS — E785 Hyperlipidemia, unspecified: Secondary | ICD-10-CM

## 2023-03-18 DIAGNOSIS — Z6841 Body Mass Index (BMI) 40.0 and over, adult: Secondary | ICD-10-CM

## 2023-03-18 DIAGNOSIS — E669 Obesity, unspecified: Secondary | ICD-10-CM | POA: Diagnosis not present

## 2023-03-18 MED ORDER — LOMAIRA 8 MG PO TABS
12.0000 mg | ORAL_TABLET | Freq: Every day | ORAL | 0 refills | Status: DC
  Filled 2023-03-18 – 2023-03-20 (×2): qty 45, 30d supply, fill #0

## 2023-03-18 NOTE — Progress Notes (Signed)
   SUBJECTIVE:  Chief Complaint: Obesity  Interim History: Patient is still working on being consistent with her journaling.  Life got somewhat hectic and she fell off her consistency with her journaling.  Thanksgiving was good and she hosted- her house was full.  For December holidays she is hosting.  She wants to get back to journaling and using the app to log food.   Debra Parker is here to discuss her progress with her obesity treatment plan. She is on the keeping a food journal and adhering to recommended goals of 1400-1600 calories and 90+ grams of protein and states she is following her eating plan approximately 90 % of the time. She states she is exercising 60 minutes 2 times per week.   OBJECTIVE: Visit Diagnoses: Problem List Items Addressed This Visit       Other   Other specified eating disorder - Primary   Patient on topiramate for migraines and we added additional phentermine for intake control and to assist in weight loss.  Patient has done well with combination medications. Starting weight of 282 in June of 2024.  She is at 260lbs currently.  Debra Parker send in refill of phentermine and patient Debra Parker continue current dose of topiramate.      Relevant Medications   Phentermine HCl (LOMAIRA) 8 MG TABS   Dyslipidemia (high LDL; low HDL)   Patient LDL slightly elevated at 116 and HDL low at 35.  She is not on medications.  She has done well on her meal plan and medications (not a statin) to make lifestyle changes.  Debra Parker repeat labs here or with PCP in the next 1-2 months.       Other Visit Diagnoses       Obesity with starting BMI of 46.76       Relevant Medications   Phentermine HCl (LOMAIRA) 8 MG TABS     BMI 40.0-44.9, adult (HCC)       Relevant Medications   Phentermine HCl (LOMAIRA) 8 MG TABS       No data recorded  No data recorded  No data recorded  No data recorded    ASSESSMENT AND PLAN:  Diet: Debra Parker is currently in the action stage of change. As such,  her goal is to continue with weight loss efforts. She has agreed to keeping a food journal and adhering to recommended goals of 1400-1600 calories and 90 or more grams of protein daily.  Exercise: Debra Parker has been instructed that some exercise is better than none and to continue exercising as is for weight loss and overall health benefits.   Behavior Modification:  We discussed the following Behavioral Modification Strategies today: increasing lean protein intake, increasing vegetables, meal planning and cooking strategies, holiday eating strategies, and keep a strict food journal. We discussed various medication options to help Debra Parker with her weight loss efforts and we both agreed to continue current meds of phentermine 12mg  and topiramate at same dose.  Return in about 4 weeks (around 04/15/2023) for fasting labs and IC.Marland Kitchen She was informed of the importance of frequent follow up visits to maximize her success with intensive lifestyle modifications for her multiple health conditions.  Attestation Statements:   Reviewed by clinician on day of visit: allergies, medications, problem list, medical history, surgical history, family history, social history, and previous encounter notes.    Reuben Likes, MD

## 2023-03-20 ENCOUNTER — Other Ambulatory Visit: Payer: Self-pay

## 2023-03-20 ENCOUNTER — Other Ambulatory Visit (HOSPITAL_COMMUNITY): Payer: Self-pay

## 2023-03-28 DIAGNOSIS — E785 Hyperlipidemia, unspecified: Secondary | ICD-10-CM | POA: Insufficient documentation

## 2023-03-28 NOTE — Assessment & Plan Note (Signed)
Patient on topiramate for migraines and we added additional phentermine for intake control and to assist in weight loss.  Patient has done well with combination medications. Starting weight of 282 in June of 2024.  She is at 260lbs currently.  Will send in refill of phentermine and patient will continue current dose of topiramate.

## 2023-03-28 NOTE — Assessment & Plan Note (Signed)
Patient LDL slightly elevated at 116 and HDL low at 35.  She is not on medications.  She has done well on her meal plan and medications (not a statin) to make lifestyle changes.  Will repeat labs here or with PCP in the next 1-2 months.

## 2023-04-03 DIAGNOSIS — G4733 Obstructive sleep apnea (adult) (pediatric): Secondary | ICD-10-CM | POA: Diagnosis not present

## 2023-04-21 ENCOUNTER — Other Ambulatory Visit (INDEPENDENT_AMBULATORY_CARE_PROVIDER_SITE_OTHER): Payer: Self-pay | Admitting: Internal Medicine

## 2023-04-21 ENCOUNTER — Other Ambulatory Visit: Payer: Self-pay

## 2023-04-21 ENCOUNTER — Other Ambulatory Visit (HOSPITAL_COMMUNITY): Payer: Self-pay

## 2023-04-21 ENCOUNTER — Other Ambulatory Visit: Payer: Self-pay | Admitting: Family Medicine

## 2023-04-21 DIAGNOSIS — G43809 Other migraine, not intractable, without status migrainosus: Secondary | ICD-10-CM

## 2023-04-21 DIAGNOSIS — R112 Nausea with vomiting, unspecified: Secondary | ICD-10-CM

## 2023-04-21 MED ORDER — METOCLOPRAMIDE HCL 10 MG PO TABS
10.0000 mg | ORAL_TABLET | Freq: Four times a day (QID) | ORAL | 0 refills | Status: DC
  Filled 2023-04-21: qty 30, 8d supply, fill #0

## 2023-04-29 ENCOUNTER — Ambulatory Visit (INDEPENDENT_AMBULATORY_CARE_PROVIDER_SITE_OTHER): Payer: Commercial Managed Care - PPO | Admitting: Family Medicine

## 2023-05-02 ENCOUNTER — Telehealth: Payer: Commercial Managed Care - PPO | Admitting: Nurse Practitioner

## 2023-05-02 DIAGNOSIS — J208 Acute bronchitis due to other specified organisms: Secondary | ICD-10-CM | POA: Diagnosis not present

## 2023-05-02 MED ORDER — PSEUDOEPH-BROMPHEN-DM 30-2-10 MG/5ML PO SYRP: 5.0000 mL | ORAL_SOLUTION | Freq: Four times a day (QID) | ORAL | 0 refills | Status: DC | PRN

## 2023-05-02 MED ORDER — FLUTICASONE PROPIONATE 50 MCG/ACT NA SUSP: 2.0000 | Freq: Every day | NASAL | 0 refills | Status: DC

## 2023-05-02 NOTE — Progress Notes (Signed)
E-Visit for Cough   We are sorry that you are not feeling well.  Here is how we plan to help!  Providers prescribe antibiotics to treat infections caused by bacteria. Antibiotics are very powerful in treating bacterial infections when they are used properly. To maintain their effectiveness, they should be used only when necessary. Overuse of antibiotics has resulted in the development of superbugs that are resistant to treatment!    After careful review of your answers, I would not recommend an antibiotic for your condition.  Antibiotics are not effective against viruses and therefore should not be used to treat them. Common examples of infections caused by viruses include colds and flu  Please feel free to reach back out in 4-5 days if you are not feeling any better  Based on your presentation I believe you most likely have A cough due to a virus.  This is called viral bronchitis and is best treated by rest, plenty of fluids and control of the cough.  You may use Ibuprofen or Tylenol as directed to help your symptoms.     In addition you may use a prescription cough syrup that has been sent to the pharmacy  I have also prescribed a nasal spray for nasal congestion.   From your responses in the eVisit questionnaire you describe inflammation in the upper respiratory tract which is causing a significant cough.  This is commonly called Bronchitis and has four common causes:   Allergies Viral Infections Acid Reflux Bacterial Infection Allergies, viruses and acid reflux are treated by controlling symptoms or eliminating the cause. An example might be a cough caused by taking certain blood pressure medications. You stop the cough by changing the medication. Another example might be a cough caused by acid reflux. Controlling the reflux helps control the cough.  USE OF BRONCHODILATOR ("RESCUE") INHALERS: There is a risk from using your bronchodilator too frequently.  The risk is that over-reliance on  a medication which only relaxes the muscles surrounding the breathing tubes can reduce the effectiveness of medications prescribed to reduce swelling and congestion of the tubes themselves.  Although you feel brief relief from the bronchodilator inhaler, your asthma may actually be worsening with the tubes becoming more swollen and filled with mucus.  This can delay other crucial treatments, such as oral steroid medications. If you need to use a bronchodilator inhaler daily, several times per day, you should discuss this with your provider.  There are probably better treatments that could be used to keep your asthma under control.     HOME CARE Only take medications as instructed by your medical team. Complete the entire course of an antibiotic. Drink plenty of fluids and get plenty of rest. Avoid close contacts especially the very young and the elderly Cover your mouth if you cough or cough into your sleeve. Always remember to wash your hands A steam or ultrasonic humidifier can help congestion.   GET HELP RIGHT AWAY IF: You develop worsening fever. You become short of breath You cough up blood. Your symptoms persist after you have completed your treatment plan MAKE SURE YOU  Understand these instructions. Will watch your condition. Will get help right away if you are not doing well or get worse.    Thank you for choosing an e-visit.  Your e-visit answers were reviewed by a board certified advanced clinical practitioner to complete your personal care plan. Depending upon the condition, your plan could have included both over the counter or prescription medications.  Please review your pharmacy choice. Make sure the pharmacy is open so you can pick up prescription now. If there is a problem, you may contact your provider through [employer] and have the prescription routed to another pharmacy.  Your safety is important to [place]. If you have drug allergies check your prescription  carefully.   For the next 24 hours you can use MyChart to ask questions about today's visit, request a non-urgent call back, or ask for a work or school excuse. You will get an email in the next two days asking about your experience. I hope that your e-visit has been valuable and will speed your recovery.

## 2023-05-02 NOTE — Progress Notes (Signed)
I have spent 5 minutes in review of e-visit questionnaire, review and updating patient chart, medical decision making and response to patient.   [name], NP

## 2023-05-03 DIAGNOSIS — J22 Unspecified acute lower respiratory infection: Secondary | ICD-10-CM | POA: Diagnosis not present

## 2023-06-01 ENCOUNTER — Ambulatory Visit (INDEPENDENT_AMBULATORY_CARE_PROVIDER_SITE_OTHER): Payer: Commercial Managed Care - PPO | Admitting: Family Medicine

## 2023-06-01 ENCOUNTER — Other Ambulatory Visit (HOSPITAL_COMMUNITY): Payer: Self-pay

## 2023-06-01 ENCOUNTER — Encounter (INDEPENDENT_AMBULATORY_CARE_PROVIDER_SITE_OTHER): Payer: Self-pay | Admitting: Family Medicine

## 2023-06-01 VITALS — BP 123/56 | HR 60 | Temp 98.4°F | Ht 64.0 in | Wt 262.0 lb

## 2023-06-01 DIAGNOSIS — R7303 Prediabetes: Secondary | ICD-10-CM | POA: Diagnosis not present

## 2023-06-01 DIAGNOSIS — Z6841 Body Mass Index (BMI) 40.0 and over, adult: Secondary | ICD-10-CM | POA: Diagnosis not present

## 2023-06-01 DIAGNOSIS — E669 Obesity, unspecified: Secondary | ICD-10-CM | POA: Diagnosis not present

## 2023-06-01 DIAGNOSIS — E7849 Other hyperlipidemia: Secondary | ICD-10-CM

## 2023-06-01 DIAGNOSIS — R0602 Shortness of breath: Secondary | ICD-10-CM | POA: Diagnosis not present

## 2023-06-01 DIAGNOSIS — F5089 Other specified eating disorder: Secondary | ICD-10-CM

## 2023-06-01 DIAGNOSIS — E559 Vitamin D deficiency, unspecified: Secondary | ICD-10-CM

## 2023-06-01 MED ORDER — LOMAIRA 8 MG PO TABS
ORAL_TABLET | ORAL | 0 refills | Status: DC
  Filled 2023-06-01: qty 53, 30d supply, fill #0

## 2023-06-01 NOTE — Addendum Note (Signed)
 Addended by: Langston Reusing on: 06/01/2023 03:29 PM   Modules accepted: Orders

## 2023-06-01 NOTE — Progress Notes (Signed)
   SUBJECTIVE:  Chief Complaint: Obesity  Interim History: Patient had a surprise birthday party 2 days ago- went to [place].  She has been sticking to the plan most of the time.  She ran out of her medication about a week ago.  She got a new phone and started back to tracking food intake but lost her prior diary entries.    Debra Parker is here to discuss her progress with her obesity treatment plan. She is on the keeping a food journal and adhering to recommended goals of 1400-1600 calories and 90 grams of protein and states she is following her eating plan approximately 85-90 % of the time. She states she is exercising 60 minutes 2 times per week.   OBJECTIVE: Visit Diagnoses: Problem List Items Addressed This Visit       Other   SOBOE (shortness of breath on exertion) - Primary   Initial RMR of 1958 on 01/15/22.  She has been able to be consistent with her food intake since coming back consistently to the program after some other medical issues.  RMR today of 1742 so will decrease total calorie intake to 1500 calories with goal of 95 or more grams of protein daily.        Vitals Temp: 98.4 F (36.9 C) BP: (!) 123/56 Pulse Rate: 60 SpO2: 100 %   Anthropometric Measurements Height: 5\' 4"  (1.626 m) Weight: 262 lb (118.8 kg) BMI (Calculated): 44.95 Weight at Last Visit: 260 lb Weight Lost Since Last Visit: 0 Weight Gained Since Last Visit: 2 Starting Weight: 281 lb Total Weight Loss (lbs): 19 lb (8.618 kg)   Body Composition  Body Fat %: 49.2 % Fat Mass (lbs): 129 lbs Muscle Mass (lbs): 126.2 lbs Total Body Water (lbs): 92 lbs Visceral Fat Rating : 15   Other Clinical Data Fasting: yes Labs: yes Today's Visit #: 15 Starting Date: 01/15/22     ASSESSMENT AND PLAN:  Diet: Debra Parker is currently in the action stage of change. As such, her goal is to continue with weight loss efforts. She has agreed to keeping a food journal and adhering to recommended goals  of 1400-1500 calories and 95 or more grams of protein daily.  Exercise: Debra Parker has been instructed to work up to a goal of 150 minutes of combined cardio and strengthening exercise per week for weight loss and overall health benefits.   Behavior Modification:  We discussed the following Behavioral Modification Strategies today: increasing lean protein intake, increasing vegetables, meal planning and cooking strategies, and keep a strict food journal. We discussed various medication options to help Debra Parker with her weight loss efforts and we both agreed to continue phentermine and topiramate at prior dosage and then increase to lomaira twice a day.  No follow-ups on file.Marland Kitchen She was informed of the importance of frequent follow up visits to maximize her success with intensive lifestyle modifications for her multiple health conditions.  Attestation Statements:   Reviewed by clinician on day of visit: allergies, medications, problem list, medical history, surgical history, family history, social history, and previous encounter notes.     Reuben Likes, MD

## 2023-06-01 NOTE — Addendum Note (Signed)
 Addended by: Kirke Corin A on: 06/01/2023 10:33 AM   Modules accepted: Orders

## 2023-06-01 NOTE — Assessment & Plan Note (Signed)
 Initial RMR of 1958 on 01/15/22.  She has been able to be consistent with her food intake since coming back consistently to the program after some other medical issues.  RMR today of 1742 so will decrease total calorie intake to 1500 calories with goal of 95 or more grams of protein daily.

## 2023-06-02 LAB — LIPID PANEL WITH LDL/HDL RATIO
Cholesterol, Total: 185 mg/dL (ref 100–199)
HDL: 45 mg/dL (ref >39)
LDL Chol Calc (NIH): 121 mg/dL — ABNORMAL HIGH (ref 0–99)
LDL/HDL Ratio: 2.7 {ratio} (ref 0.0–3.2)
Triglycerides: 104 mg/dL (ref 0–149)
VLDL Cholesterol Cal: 19 mg/dL (ref 5–40)

## 2023-06-02 LAB — COMPREHENSIVE METABOLIC PANEL
ALT: 20 [IU]/L (ref 0–32)
AST: 16 [IU]/L (ref 0–40)
Albumin: 4.2 g/dL (ref 3.9–4.9)
Alkaline Phosphatase: 84 [IU]/L (ref 44–121)
BUN/Creatinine Ratio: 18 (ref 9–23)
BUN: 15 mg/dL (ref 6–20)
Bilirubin Total: 0.2 mg/dL (ref 0.0–1.2)
CO2: 19 mmol/L — ABNORMAL LOW (ref 20–29)
Calcium: 8.8 mg/dL (ref 8.7–10.2)
Chloride: 104 mmol/L (ref 96–106)
Creatinine, Ser: 0.82 mg/dL (ref 0.57–1.00)
Globulin, Total: 3 g/dL (ref 1.5–4.5)
Glucose: 89 mg/dL (ref 70–99)
Potassium: 4.2 mmol/L (ref 3.5–5.2)
Sodium: 139 mmol/L (ref 134–144)
Total Protein: 7.2 g/dL (ref 6.0–8.5)
eGFR: 93 mL/min/{1.73_m2} (ref >59)

## 2023-06-02 LAB — VITAMIN D 25 HYDROXY (VIT D DEFICIENCY, FRACTURES): Vit D, 25-Hydroxy: 19.1 ng/mL — ABNORMAL LOW (ref 30.0–100.0)

## 2023-06-02 LAB — INSULIN, RANDOM: INSULIN: 20.9 u[IU]/mL (ref 2.6–24.9)

## 2023-06-02 LAB — HEMOGLOBIN A1C
Est. average glucose Bld gHb Est-mCnc: 131 mg/dL
Hgb A1c MFr Bld: 6.2 % — ABNORMAL HIGH (ref 4.8–5.6)

## 2023-06-18 DIAGNOSIS — M25512 Pain in left shoulder: Secondary | ICD-10-CM | POA: Diagnosis not present

## 2023-06-19 ENCOUNTER — Other Ambulatory Visit (HOSPITAL_COMMUNITY): Payer: Self-pay

## 2023-06-19 MED ORDER — MELOXICAM 7.5 MG PO TABS
7.5000 mg | ORAL_TABLET | Freq: Two times a day (BID) | ORAL | 0 refills | Status: DC
  Filled 2023-06-19: qty 28, 14d supply, fill #0

## 2023-07-01 ENCOUNTER — Other Ambulatory Visit (HOSPITAL_COMMUNITY): Payer: Self-pay

## 2023-07-01 ENCOUNTER — Ambulatory Visit
Admission: EM | Admit: 2023-07-01 | Discharge: 2023-07-01 | Disposition: A | Discharge status: Home / Self Care | Attending: Family Medicine | Admitting: Family Medicine

## 2023-07-01 ENCOUNTER — Encounter: Payer: Self-pay | Admitting: Emergency Medicine

## 2023-07-01 DIAGNOSIS — R051 Acute cough: Secondary | ICD-10-CM

## 2023-07-01 DIAGNOSIS — J3089 Other allergic rhinitis: Secondary | ICD-10-CM

## 2023-07-01 MED ORDER — CETIRIZINE HCL 10 MG PO TABS: 10.0000 mg | ORAL_TABLET | Freq: Every day | ORAL | 2 refills | Status: DC

## 2023-07-01 MED ORDER — GUAIFENESIN ER 600 MG PO TB12: 600.0000 mg | ORAL_TABLET | Freq: Two times a day (BID) | ORAL | 0 refills | Status: DC

## 2023-07-01 MED ORDER — PROMETHAZINE-DM 6.25-15 MG/5ML PO SYRP: 5.0000 mL | ORAL_SOLUTION | Freq: Four times a day (QID) | ORAL | 0 refills | Status: DC | PRN

## 2023-07-01 MED ORDER — FLUTICASONE PROPIONATE 50 MCG/ACT NA SUSP: 1.0000 | Freq: Two times a day (BID) | NASAL | 2 refills | Status: DC

## 2023-07-01 NOTE — ED Provider Notes (Signed)
 [facility]    CSN: [id number] Arrival date & time: 07/01/23  1716      History   Chief Complaint No chief complaint on file.   HPI [place] Alessa Mazur is a 38 y.o. female.   Presenting today with 4-day history of present cough, runny nose, postnasal drainage.  Denies fever, chills, chest pain, shortness of breath, abdominal pain, vomiting, diarrhea.  She denies any seasonal allergies when asked but per chart review does have a history of allergic rhinitis, states she has "chronic bronchitis" that seems to only happen during the season change.  She denies any history of smoking or diagnoses of COPD or asthma.  So far not tried anything over-the-counter for her symptoms.    Past Medical History:  Diagnosis Date   Allergy    Bronchitis    Diverticulitis 2019   GERD (gastroesophageal reflux disease)    Hypertension    when pregnant   Kidney stone    Lichen planopilaris    Migraines    Obesity    OSA (obstructive sleep apnea)    Prediabetes    Sleep apnea    Vitamin D deficiency     Patient Active Problem List   Diagnosis Date Noted   SOBOE (shortness of breath on exertion) 06/01/2023   Dyslipidemia (high LDL; low HDL) 03/28/2023   Other specified eating disorder 03/18/2023   Chronic bronchitis (HCC) 10/01/2022   Vitamin D deficiency 12/31/2021   Prediabetes 12/25/2021   OSA (obstructive sleep apnea) 12/25/2021   Lichen planopilaris 02/19/2020   Migraine 02/17/2020   Morbid obesity (HCC) 02/01/2018    Past Surgical History:  Procedure Laterality Date   CESAREAN SECTION  2007   fatty pocket removed  1999   WISDOM TOOTH EXTRACTION  2000    OB History     Gravida  1   Para      Term      Preterm      AB      Living  1      SAB      IAB      Ectopic      Multiple      Live Births               Home Medications    Prior to Admission medications   Medication Sig Start Date End Date Taking? Authorizing Provider   cetirizine (ZYRTEC ALLERGY) 10 MG tablet Take 1 tablet (10 mg total) by mouth daily. 07/01/23  Yes Particia Nearing, PA-C  fluticasone [facility]) 50 MCG/ACT nasal spray Place 1 spray into both nostrils 2 (two) times daily. 07/01/23  Yes Particia Nearing, PA-C  guaiFENesin (MUCINEX) 600 MG 12 hr tablet Take 1 tablet (600 mg total) by mouth 2 (two) times daily. 07/01/23  Yes Particia Nearing, PA-C  promethazine-dextromethorphan (PROMETHAZINE-DM) 6.25-15 MG/5ML syrup Take 5 mLs by mouth 4 (four) times daily as needed. 07/01/23  Yes Particia Nearing, PA-C  acetaminophen (TYLENOL) 500 MG tablet Take 500 mg by mouth every 6 (six) hours as needed for mild pain.    [provider]  cyanocobalamin (VITAMIN B12) 1000 MCG/ML injection Inject 1 mL (1,000 mcg total) into the muscle every 30 (thirty) days. 10/23/22   Karie Georges, MD  furosemide (LASIX) 20 MG tablet Take 0.5 tablets (10 mg total) by mouth daily as needed. 10/01/22   Karie Georges, MD  levonorgestrel (MIRENA, 52 MG,) 20 MCG/24HR IUD 1 Intra Uterine Device (1 each  total) by Intrauterine route once for 1 dose. 11/10/18 05/30/22  Wynn Banker, MD  meloxicam (MOBIC) 7.5 MG tablet Take 1 tablet (7.5 mg total) by mouth 2 (two) times daily for shoulder pain. 06/18/23     metoCLOPramide (REGLAN) 10 MG tablet Take 1 tablet (10 mg total) by mouth every 6 (six) hours. 04/21/23   Karie Georges, MD  ondansetron (ZOFRAN-ODT) 4 MG disintegrating tablet Dissolve 1 tablet (4 mg total) in mouth every 8 (eight) hours as needed for nausea or vomiting. 05/26/22   Karie Georges, MD  pantoprazole (PROTONIX) 40 MG tablet Take 1 tablet (40 mg total) by mouth daily. 05/30/22   Lynann Bologna, MD  Phentermine HCl (LOMAIRA) 8 MG TABS Take 1.5 tablets (12 mg total) by mouth daily for 14 days, THEN 2 tablets (16 mg total) daily for 16 days. 06/01/23 07/01/23  Langston Reusing, MD  topiramate (TOPAMAX) 50 MG tablet Take 3 tablets  (150 mg total) by mouth 2 (two) times daily in the morning and at bedtime 12/18/22 03/18/23  Worthy Rancher, MD  Vitamin D, Ergocalciferol, (DRISDOL) 1.25 MG (50000 UNIT) CAPS capsule Take 1 capsule (50,000 Units total) by mouth every 7 (seven) days. 01/01/22   Karie Georges, MD    Family History Family History  Problem Relation Age of Onset   Hyperlipidemia Mother    Diabetes Mother    Liver disease Mother    Obesity Mother    Obesity Father    Cancer Father    Hypertension Father    Diabetes Father    Hyperlipidemia Father    Early death Sister        stillborn   Heart disease Brother    Heart attack Brother 26   Arthritis Maternal Grandmother    Diabetes Maternal Grandmother    Heart attack Maternal Grandmother    Throat cancer Maternal Grandfather    Diabetes Paternal Grandfather    Stroke Paternal Grandfather    Kidney disease Paternal Grandfather    Stomach cancer Paternal Uncle    Kidney disease Paternal Uncle    Colon cancer Neg Hx    Rectal cancer Neg Hx    Esophageal cancer Neg Hx     Social History Social History   Tobacco Use   Smoking status: Never   Smokeless tobacco: Never  Vaping Use   Vaping status: Never Used  Substance Use Topics   Alcohol use: No   Drug use: No     Allergies   Aimovig [erenumab-aooe] and Sulfa antibiotics   Review of Systems Review of Systems Per HPI  Physical Exam Triage Vital Signs ED Triage Vitals  Encounter Vitals Group     BP 07/01/23 1814 122/73     Systolic BP Percentile --      Diastolic BP Percentile --      Pulse Rate 07/01/23 1814 77     Resp 07/01/23 1814 18     Temp 07/01/23 1814 97.8 F (36.6 C)     Temp Source 07/01/23 1814 Oral     SpO2 07/01/23 1814 98 %     Weight --      Height --      Head Circumference --      Peak Flow --      Pain Score 07/01/23 1815 0     Pain Loc --      Pain Education --      Exclude from Growth Chart --    No data found.  Updated Vital Signs BP  122/73 (BP Location: Right Arm)   Pulse 77   Temp 97.8 F (36.6 C) (Oral)   Resp 18   SpO2 98%   Visual Acuity Right Eye Distance:   Left Eye Distance:   Bilateral Distance:    Right Eye Near:   Left Eye Near:    Bilateral Near:     Physical Exam Vitals and nursing note reviewed.  Constitutional:      Appearance: Normal appearance.  HENT:     Head: Atraumatic.     Right Ear: Tympanic membrane and external ear normal.     Left Ear: Tympanic membrane and external ear normal.     Nose: Rhinorrhea present.     Mouth/Throat:     Mouth: Mucous membranes are moist.     Pharynx: Posterior oropharyngeal erythema present.  Eyes:     Extraocular Movements: Extraocular movements intact.     Conjunctiva/sclera: Conjunctivae normal.  Cardiovascular:     Rate and Rhythm: Normal rate and regular rhythm.     Heart sounds: Normal heart sounds.  Pulmonary:     Effort: Pulmonary effort is normal.     Breath sounds: Normal breath sounds. No wheezing or rales.  Musculoskeletal:        General: Normal range of motion.     Cervical back: Normal range of motion and neck supple.  Skin:    General: Skin is warm and dry.  Neurological:     Mental Status: She is alert and oriented to person, place, and time.  Psychiatric:        Mood and Affect: Mood normal.        Thought Content: Thought content normal.      UC Treatments / Results  Labs (all labs ordered are listed, but only abnormal results are displayed) Labs Reviewed - No data to display  EKG   Radiology No results found.  Procedures Procedures (including critical care time)  Medications Ordered in UC Medications - No data to display  Initial Impression / Assessment and Plan / UC Course  I have reviewed the triage vital signs and the nursing notes.  Pertinent labs & imaging results that were available during my care of the patient were reviewed by me and considered in my medical decision making (see chart for  details).     Suspect seasonal allergy exacerbation causing cough.  Will start allergy regimen with Zyrtec and Flonase, discussed Phenergan DM, Mucinex for cough and supportive home care.  Return precautions reviewed.  Final Clinical Impressions(s) / UC Diagnoses   Final diagnoses:  Seasonal allergic rhinitis due to other allergic trigger  Acute cough     Discharge Instructions      Your vital signs and family very reassuring today.  You may have a cold virus but this also may be seasonal allergy flareup symptoms.  Will start a consistent allergy regimen of Zyrtec and Flonase daily as you state you tend to have issues with the changes of weather and season.  Will treat cough with Mucinex, Phenergan DM.  Drink plenty fluids, get lots of rest and follow-up for significantly worsening symptoms.    ED Prescriptions     Medication Sig Dispense Auth. Provider   promethazine-dextromethorphan (PROMETHAZINE-DM) 6.25-15 MG/5ML syrup Take 5 mLs by mouth 4 (four) times daily as needed. 100 mL Particia Nearing, PA-C   cetirizine (ZYRTEC ALLERGY) 10 MG tablet Take 1 tablet (10 mg total) by mouth daily. 30 tablet Particia Nearing,  PA-C   fluticasone (FLONASE) 50 MCG/ACT nasal spray Place 1 spray into both nostrils 2 (two) times daily. 16 g Particia Nearing, PA-C   guaiFENesin (MUCINEX) 600 MG 12 hr tablet Take 1 tablet (600 mg total) by mouth 2 (two) times daily. 20 tablet Particia Nearing, [place]      PDMP not reviewed this encounter.   Particia Nearing, [place] 07/01/23 1926

## 2023-07-01 NOTE — ED Triage Notes (Signed)
 Productive cough with green sputum, runny nose since Sunday.

## 2023-07-01 NOTE — Discharge Instructions (Signed)
 Your vital signs and family very reassuring today.  You may have a cold virus but this also may be seasonal allergy flareup symptoms.  Will start a consistent allergy regimen of Zyrtec and Flonase daily as you state you tend to have issues with the changes of weather and season.  Will treat cough with Mucinex, Phenergan DM.  Drink plenty fluids, get lots of rest and follow-up for significantly worsening symptoms.

## 2023-07-02 ENCOUNTER — Ambulatory Visit (INDEPENDENT_AMBULATORY_CARE_PROVIDER_SITE_OTHER): Payer: Commercial Managed Care - PPO | Admitting: Family Medicine

## 2023-07-03 ENCOUNTER — Telehealth: Admitting: Physician Assistant

## 2023-07-03 DIAGNOSIS — J208 Acute bronchitis due to other specified organisms: Secondary | ICD-10-CM

## 2023-07-03 DIAGNOSIS — B9689 Other specified bacterial agents as the cause of diseases classified elsewhere: Secondary | ICD-10-CM | POA: Diagnosis not present

## 2023-07-03 MED ORDER — AZITHROMYCIN 250 MG PO TABS: ORAL_TABLET | ORAL | 0 refills | Status: DC

## 2023-07-03 MED ORDER — PREDNISONE 20 MG PO TABS: 40.0000 mg | ORAL_TABLET | Freq: Every day | ORAL | 0 refills | Status: DC

## 2023-07-03 MED ORDER — ALBUTEROL SULFATE HFA 108 (90 BASE) MCG/ACT IN AERS: 2.0000 | INHALATION_SPRAY | Freq: Four times a day (QID) | RESPIRATORY_TRACT | 0 refills | Status: AC | PRN

## 2023-07-03 NOTE — Addendum Note (Signed)
 Addended by: Waldon Merl on: 07/03/2023 07:44 PM   Modules accepted: Orders

## 2023-07-03 NOTE — Patient Instructions (Signed)
 Debra Parker, thank you for joining Piedad Climes, PA-C for today's virtual visit.  While this provider is not your primary care provider (PCP), if your PCP is located in our provider database this encounter information will be shared with them immediately following your visit.   A [facility] MyChart account gives you access to today's visit and all your visits, tests, and labs performed at [facility] " click here if you don't have a  MyChart account or go to [email or web address]  Consent: (Patient) Debra Parker provided verbal consent for this virtual visit at the beginning of the encounter.  Current Medications:  Current Outpatient Medications:    acetaminophen (TYLENOL) 500 MG tablet, Take 500 mg by mouth every 6 (six) hours as needed for mild pain., Disp: , Rfl:    cetirizine (ZYRTEC ALLERGY) 10 MG tablet, Take 1 tablet (10 mg total) by mouth daily., Disp: 30 tablet, Rfl: 2   cyanocobalamin (VITAMIN B12) 1000 MCG/ML injection, Inject 1 mL (1,000 mcg total) into the muscle every 30 (thirty) days., Disp: 1 mL, Rfl: 5   fluticasone (FLONASE) 50 MCG/ACT nasal spray, Place 1 spray into both nostrils 2 (two) times daily., Disp: 16 g, Rfl: 2   furosemide (LASIX) 20 MG tablet, Take 0.5 tablets (10 mg total) by mouth daily as needed., Disp: 30 tablet, Rfl: 0   guaiFENesin (MUCINEX) 600 MG 12 hr tablet, Take 1 tablet (600 mg total) by mouth 2 (two) times daily., Disp: 20 tablet, Rfl: 0   levonorgestrel (MIRENA, 52 MG,) 20 MCG/24HR IUD, 1 Intra Uterine Device (1 each total) by Intrauterine route once for 1 dose., Disp: 1 each, Rfl: 0   meloxicam (MOBIC) 7.5 MG tablet, Take 1 tablet (7.5 mg total) by mouth 2 (two) times daily for shoulder pain., Disp: 28 tablet, Rfl: 0   metoCLOPramide (REGLAN) 10 MG tablet, Take 1 tablet (10 mg total) by mouth every 6 (six) hours., Disp: 30 tablet, Rfl: 0   ondansetron (ZOFRAN-ODT) 4 MG disintegrating tablet,  Dissolve 1 tablet (4 mg total) in mouth every 8 (eight) hours as needed for nausea or vomiting., Disp: 30 tablet, Rfl: 2   pantoprazole (PROTONIX) 40 MG tablet, Take 1 tablet (40 mg total) by mouth daily., Disp: 90 tablet, Rfl: 3   Phentermine HCl (LOMAIRA) 8 MG TABS, Take 1.5 tablets (12 mg total) by mouth daily for 14 days, THEN 2 tablets (16 mg total) daily for 16 days., Disp: 53 tablet, Rfl: 0   promethazine-dextromethorphan (PROMETHAZINE-DM) 6.25-15 MG/5ML syrup, Take 5 mLs by mouth 4 (four) times daily as needed., Disp: 100 mL, Rfl: 0   topiramate (TOPAMAX) 50 MG tablet, Take 3 tablets (150 mg total) by mouth 2 (two) times daily in the morning and at bedtime, Disp: 180 tablet, Rfl: 0   Vitamin D, Ergocalciferol, (DRISDOL) 1.25 MG (50000 UNIT) CAPS capsule, Take 1 capsule (50,000 Units total) by mouth every 7 (seven) days., Disp: 12 capsule, Rfl: 3   Medications ordered in this encounter:  No orders of the defined types were placed in this encounter.    *If you need refills on other medications prior to your next appointment, please contact your pharmacy*  Follow-Up: Call back or seek an in-person evaluation if the symptoms worsen or if the condition fails to improve as anticipated.   Virtual Care [phone]  Other Instructions Take antibiotic (Azithromycin) as directed.  Increase fluids.  Get plenty of rest. Use Mucinex for congestion. Continue your cough medication. Use  the albuterol inhaler as directed. Take a daily probiotic (I recommend Align or Culturelle, but even Activia Yogurt may be beneficial).  A humidifier placed in the bedroom may offer some relief for a dry, scratchy throat of nasal irritation.  Read information below on acute bronchitis. Please call or return to clinic if symptoms are not improving.  Acute Bronchitis Bronchitis is when the airways that extend from the windpipe into the lungs get red, puffy, and painful (inflamed). Bronchitis often causes  thick spit (mucus) to develop. This leads to a cough. A cough is the most common symptom of bronchitis. In acute bronchitis, the condition usually begins suddenly and goes away over time (usually in 2 weeks). Smoking, allergies, and asthma can make bronchitis worse. Repeated episodes of bronchitis may cause more lung problems.  HOME CARE Rest. Drink enough fluids to keep your pee (urine) clear or pale yellow (unless you need to limit fluids as told by your doctor). Only take over-the-counter or prescription medicines as told by your doctor. Avoid smoking and secondhand smoke. These can make bronchitis worse. If you are a smoker, think about using nicotine gum or skin patches. Quitting smoking will help your lungs heal faster. Reduce the chance of getting bronchitis again by: Washing your hands often. Avoiding people with cold symptoms. Trying not to touch your hands to your mouth, nose, or eyes. Follow up with your doctor as told.  GET HELP IF: Your symptoms do not improve after 1 week of treatment. Symptoms include: Cough. Fever. Coughing up thick spit. Body aches. Chest congestion. Chills. Shortness of breath. Sore throat.  GET HELP RIGHT AWAY IF:  You have an increased fever. You have chills. You have severe shortness of breath. You have bloody thick spit (sputum). You throw up (vomit) often. You lose too much body fluid (dehydration). You have a severe headache. You faint.  MAKE SURE YOU:  Understand these instructions. Will watch your condition. Will get help right away if you are not doing well or get worse. Document Released: 09/10/2007 Document Revised: 11/24/2012 Document Reviewed: 09/14/2012 [facility] Patient Information 2015 [place], [place]. This information is not intended to replace advice given to you by your health care provider. Make sure you discuss any questions you have with your health care provider.    If you have been instructed to have an in-person  evaluation today at a local Urgent Care facility, please use the link below. It will take you to a list of all of our available [facility], including address, phone number and hours of operation. Please do not delay care.  [facility]  If you or a family member do not have a primary care provider, use the link below to schedule a visit and establish care. When you choose a [facility] primary care physician or advanced practice provider, you gain a long-term partner in health. Find a Primary Care Provider  Learn more about [facility] in-office and virtual care options: [facility] - Get Care Now

## 2023-07-03 NOTE — Progress Notes (Signed)
 Virtual Visit Consent   Debra Parker, you are scheduled for a virtual visit with a [facility] provider today. Just as with appointments in the office, your consent must be obtained to participate. Your consent will be active for this visit and any virtual visit you may have with one of our providers in the next 365 days. If you have a MyChart account, a copy of this consent can be sent to you electronically.  As this is a virtual visit, video technology does not allow for your provider to perform a traditional examination. This may limit your provider's ability to fully assess your condition. If your provider identifies any concerns that need to be evaluated in person or the need to arrange testing (such as labs, EKG, etc.), we will make arrangements to do so. Although advances in technology are sophisticated, we cannot ensure that it will always work on either your end or our end. If the connection with a video visit is poor, the visit may have to be switched to a telephone visit. With either a video or telephone visit, we are not always able to ensure that we have a secure connection.  By engaging in this virtual visit, you consent to the provision of healthcare and authorize for your insurance to be billed (if applicable) for the services provided during this visit. Depending on your insurance coverage, you may receive a charge related to this service.  I need to obtain your verbal consent now. Are you willing to proceed with your visit today? Debra Parker has provided verbal consent on 07/03/2023 for a virtual visit (video or telephone 40). Piedad Climes, [place]  Date: 07/03/2023 4:30 PM   Virtual Visit via Video Note   I, Piedad Climes, connected with  Debra Parker  ([id number], Oct 22, 1985) on 40/28/25 at  4:30 PM EDT by a video-enabled telemedicine application and verified that I am speaking with the correct person using two identifiers.  Location: Patient:  Virtual Visit Location Patient: Home Provider: Virtual Visit Location Provider: Home Office   I discussed the limitations of evaluation and management by telemedicine and the availability of in person appointments. The patient expressed understanding and agreed to proceed.    History of Present Illness: Debra Parker is a 38 y.o. who identifies as a female who was assigned female at birth, and is being seen today for persistent and progressive URI symptoms starting 40-6 days ago with chest congestion, cough that has become productive of thick sputum. Notes history of seasonal bronchitis with weather change but denies history of allergies. No known history of asthma. Denies fever, chest pain. Notes chest tightness and wheezing. Mucous is now changed colors  HPI: HPI  Problems:  Patient Active Problem List   Diagnosis Date Noted   SOBOE (shortness of breath on exertion) 06/01/2023   Dyslipidemia (high LDL; low HDL) 03/28/2023   Other specified eating disorder 03/18/2023   Chronic bronchitis (HCC) 10/01/2022   Vitamin D deficiency 12/31/2021   Prediabetes 12/25/2021   OSA (obstructive sleep apnea) 12/25/2021   Lichen planopilaris 02/19/2020   Migraine 02/17/2020   Morbid obesity (HCC) 02/01/2018    Allergies:  Allergies  Allergen Reactions   Aimovig [Erenumab-Aooe] Hives and Rash   Sulfa Antibiotics Rash    Not sure.   Medications:  Current Outpatient Medications:    albuterol (VENTOLIN HFA) 108 (90 Base) MCG/ACT inhaler, Inhale 2 puffs into the lungs every 6 (six) hours as needed for wheezing or shortness of breath.,  Disp: 8 g, Rfl: 0   predniSONE (DELTASONE) 20 MG tablet, Take 2 tablets (40 mg total) by mouth daily with breakfast., Disp: 10 tablet, Rfl: 0   acetaminophen (TYLENOL) 500 MG tablet, Take 500 mg by mouth every 6 (six) hours as needed for mild pain., Disp: , Rfl:    cetirizine (ZYRTEC ALLERGY) 10 MG tablet, Take 1 tablet (10 mg total) by mouth daily., Disp: 30  tablet, Rfl: 2   cyanocobalamin (VITAMIN B12) 1000 MCG/ML injection, Inject 1 mL (1,000 mcg total) into the muscle every 30 (thirty) days., Disp: 1 mL, Rfl: 5   fluticasone (FLONASE) 50 MCG/ACT nasal spray, Place 1 spray into both nostrils 2 (two) times daily., Disp: 16 g, Rfl: 2   furosemide (LASIX) 20 MG tablet, Take 0.5 tablets (10 mg total) by mouth daily as needed., Disp: 30 tablet, Rfl: 0   guaiFENesin (MUCINEX) 600 MG 12 hr tablet, Take 1 tablet (600 mg total) by mouth 2 (two) times daily., Disp: 20 tablet, Rfl: 0   levonorgestrel (MIRENA, 52 MG,) 20 MCG/24HR IUD, 1 Intra Uterine Device (1 each total) by Intrauterine route once for 1 dose., Disp: 1 each, Rfl: 0   meloxicam (MOBIC) 7.5 MG tablet, Take 1 tablet (7.5 mg total) by mouth 2 (two) times daily for shoulder pain., Disp: 28 tablet, Rfl: 0   metoCLOPramide (REGLAN) 10 MG tablet, Take 1 tablet (10 mg total) by mouth every 6 (six) hours., Disp: 30 tablet, Rfl: 0   ondansetron (ZOFRAN-ODT) 4 MG disintegrating tablet, Dissolve 1 tablet (4 mg total) in mouth every 8 (eight) hours as needed for nausea or vomiting., Disp: 30 tablet, Rfl: 2   pantoprazole (PROTONIX) 40 MG tablet, Take 1 tablet (40 mg total) by mouth daily., Disp: 90 tablet, Rfl: 3   Phentermine HCl (LOMAIRA) 8 MG TABS, Take 1.5 tablets (12 mg total) by mouth daily for 14 days, THEN 2 tablets (16 mg total) daily for 16 days., Disp: 53 tablet, Rfl: 0   promethazine-dextromethorphan (PROMETHAZINE-DM) 6.25-15 MG/5ML syrup, Take 5 mLs by mouth 4 (four) times daily as needed., Disp: 100 mL, Rfl: 0   topiramate (TOPAMAX) 50 MG tablet, Take 3 tablets (150 mg total) by mouth 2 (two) times daily in the morning and at bedtime, Disp: 180 tablet, Rfl: 0   Vitamin D, Ergocalciferol, (DRISDOL) 1.25 MG (50000 UNIT) CAPS capsule, Take 1 capsule (50,000 Units total) by mouth every 7 (seven) days., Disp: 12 capsule, Rfl: 3  Observations/Objective: Patient is well-developed, well-nourished in no  acute distress.  Resting comfortably at home.  Head is normocephalic, atraumatic.  No labored breathing. Speech is clear and coherent with logical content.  Patient is alert and oriented at baseline.   Assessment and Plan: 1. Acute bacterial bronchitis (Primary) - albuterol (VENTOLIN HFA) 108 (90 Base) MCG/ACT inhaler; Inhale 2 puffs into the lungs every 6 (six) hours as needed for wheezing or shortness of breath.  Dispense: 8 g; Refill: 0 - predniSONE (DELTASONE) 20 MG tablet; Take 2 tablets (40 mg total) by mouth daily with breakfast.  Dispense: 10 tablet; Refill: 0  Rx Azithromycin.  Increase fluids.  Rest.  Saline nasal spray.  Probiotic.  Mucinex as directed.  Humidifier in bedroom. Continue promethazine-Dm syrup along with starting albuterol as directed.  Call or return to clinic if symptoms are not improving.   Follow Up Instructions: I discussed the assessment and treatment plan with the patient. The patient was provided an opportunity to ask questions and all were answered. The  patient agreed with the plan and demonstrated an understanding of the instructions.  A copy of instructions were sent to the patient via MyChart unless otherwise noted below.   The patient was advised to call back or seek an in-person evaluation if the symptoms worsen or if the condition fails to improve as anticipated.    Piedad Climes, PA-C

## 2023-07-08 ENCOUNTER — Ambulatory Visit (INDEPENDENT_AMBULATORY_CARE_PROVIDER_SITE_OTHER): Admitting: Family Medicine

## 2023-07-08 ENCOUNTER — Other Ambulatory Visit (HOSPITAL_COMMUNITY): Payer: Self-pay

## 2023-07-08 ENCOUNTER — Encounter (INDEPENDENT_AMBULATORY_CARE_PROVIDER_SITE_OTHER): Payer: Self-pay | Admitting: Family Medicine

## 2023-07-08 VITALS — BP 131/75 | HR 77 | Temp 97.7°F | Ht 64.0 in | Wt 263.0 lb

## 2023-07-08 DIAGNOSIS — Z6841 Body Mass Index (BMI) 40.0 and over, adult: Secondary | ICD-10-CM | POA: Diagnosis not present

## 2023-07-08 DIAGNOSIS — E559 Vitamin D deficiency, unspecified: Secondary | ICD-10-CM | POA: Diagnosis not present

## 2023-07-08 DIAGNOSIS — E785 Hyperlipidemia, unspecified: Secondary | ICD-10-CM

## 2023-07-08 DIAGNOSIS — E669 Obesity, unspecified: Secondary | ICD-10-CM

## 2023-07-08 DIAGNOSIS — R7303 Prediabetes: Secondary | ICD-10-CM | POA: Diagnosis not present

## 2023-07-08 MED ORDER — METFORMIN HCL 500 MG PO TABS
500.0000 mg | ORAL_TABLET | Freq: Every day | ORAL | 0 refills | Status: DC
Start: 2023-07-08 — End: 2023-09-01
  Filled 2023-07-08: qty 30, 30d supply, fill #0

## 2023-07-08 MED ORDER — TOPIRAMATE 50 MG PO TABS
150.0000 mg | ORAL_TABLET | Freq: Two times a day (BID) | ORAL | 0 refills | Status: DC
  Filled 2023-07-08: qty 180, 30d supply, fill #0

## 2023-07-08 MED ORDER — PHENTERMINE HCL 15 MG PO CAPS
15.0000 mg | ORAL_CAPSULE | Freq: Every morning | ORAL | 0 refills | Status: DC
  Filled 2023-07-08: qty 30, 30d supply, fill #0

## 2023-07-08 MED ORDER — PHENTERMINE HCL 15 MG PO CAPS: 15.0000 mg | ORAL_CAPSULE | ORAL | 0 refills | Status: DC

## 2023-07-08 NOTE — Progress Notes (Signed)
 Office: [phone]  /  Fax: [phone]  WEIGHT SUMMARY AND BIOMETRICS  Anthropometric Measurements Height: 5\' 4"  (1.626 m) Weight: 263 lb (119.3 kg) BMI (Calculated): 45.12 Weight at Last Visit: 262 lb Weight Lost Since Last Visit: 0 Weight Gained Since Last Visit: 1 lb Starting Weight: 281 lb Total Weight Loss (lbs): 18 lb (8.165 kg)   Body Composition  Body Fat %: 49.1 % Fat Mass (lbs): 129.4 lbs Muscle Mass (lbs): 127.4 lbs Total Body Water (lbs): 95.4 lbs Visceral Fat Rating : 15   Other Clinical Data Fasting: No Labs: No Today's Visit #: 16 Starting Date: 01/15/22    Chief Complaint: OBESITY    History of Present Illness Debra Parker is a 38 year old female who presents for obesity treatment plan assessment and progress evaluation.  She has been adhering to a dietary plan with a caloric intake of 1400 to 1500 calories and 94 to 95 grams of protein daily, achieving this goal 90% of the time. She exercises for 60 minutes twice a week at the gym. Despite these efforts, she has gained one pound in the last six weeks. She is currently taking Lomira and topiramate for obesity management. No side effects such as palpitations or tingling sensations from these medications.  Her recent blood work showed a hemoglobin A1c of 6.2, up from 5.8, indicating worsening glucose control. She has not been treated for this condition previously. Both parents have type 2 diabetes, indicating a significant family history of the condition.  Her cholesterol levels were reviewed, showing an LDL increase from 116 to 121. She has a family history of high cholesterol, with both parents on cholesterol medication. Her HDL improved from 35 to 45, and triglycerides remain within a good range.  She has a history of vitamin D deficiency and is on prescription vitamin D at a dose of 50,000 international units per week. Her recent blood work showed her vitamin D level at 57, which is still  low despite long-term supplementation. She recalls being on vitamin D3 previously and mentions a discussion about switching to vitamin D2.  She experienced a recent episode of bronchitis, for which she was prescribed prednisone. She reports feeling 'blah' since her weight increased during this period. She still has a 'tickly cough' at night, which she notes can last for several weeks.      PHYSICAL EXAM:  Blood pressure 131/75, pulse 77, temperature 97.7 F (36.5 C), height 5\' 4"  (1.626 m), weight 263 lb (119.3 kg), SpO2 97%. Body mass index is 45.14 kg/m.  DIAGNOSTIC DATA REVIEWED:  BMET    Component Value Date/Time   NA 139 06/01/2023 1534   K 4.2 06/01/2023 1534   CL 104 06/01/2023 1534   CO2 19 (L) 06/01/2023 1534   GLUCOSE 89 06/01/2023 1534   GLUCOSE 97 09/17/2022 0913   BUN 15 06/01/2023 1534   CREATININE 0.82 06/01/2023 1534   CREATININE 0.64 02/20/2020 1113   CALCIUM 8.8 06/01/2023 1534   GFRNONAA >60 05/19/2022 0750   GFRAA >60 10/18/2017 1416   Lab Results  Component Value Date   HGBA1C 6.2 (H) 06/01/2023   HGBA1C 5.9 (H) 02/20/2020   Lab Results  Component Value Date   INSULIN 20.9 06/01/2023   INSULIN 27.8 (H) 01/15/2022   Lab Results  Component Value Date   TSH 2.24 09/17/2022   CBC    Component Value Date/Time   WBC 8.0 09/17/2022 0913   RBC 4.66 09/17/2022 0913   HGB 13.1 09/17/2022  0913   HGB 13.0 01/15/2022 0858   HCT 40.2 09/17/2022 0913   HCT 41.6 01/15/2022 0858   PLT 283.0 09/17/2022 0913   PLT 299 01/15/2022 0858   MCV 86.3 09/17/2022 0913   MCV 87 01/15/2022 0858   MCH 28.3 05/19/2022 0750   MCHC 32.6 09/17/2022 0913   RDW 13.4 09/17/2022 0913   RDW 13.2 01/15/2022 0858   Iron Studies    Component Value Date/Time   FERRITIN 41.4 11/30/2018 1545   Lipid Panel     Component Value Date/Time   CHOL 185 06/01/2023 1534   TRIG 104 06/01/2023 1534   HDL 45 06/01/2023 1534   CHOLHDL 5 11/25/2022 0828   VLDL 17.2 11/25/2022  0828   LDLCALC 121 (H) 06/01/2023 1534   LDLCALC 82 02/20/2020 1113   Hepatic Function Panel     Component Value Date/Time   PROT 7.2 06/01/2023 1534   ALBUMIN 4.2 06/01/2023 1534   AST 16 06/01/2023 1534   ALT 20 06/01/2023 1534   ALKPHOS 84 06/01/2023 1534   BILITOT 0.2 06/01/2023 1534      Component Value Date/Time   TSH 2.24 09/17/2022 0913   Nutritional Lab Results  Component Value Date   VD25OH 19.1 (L) 06/01/2023   VD25OH 18.25 (L) 04/01/2022   VD25OH 14.82 (L) 12/31/2021     Assessment and Plan Assessment & Plan Obesity Obesity management is the primary focus. She follows a calorie-restricted diet of 1400-1500 calories with 94-95 grams of protein and exercises twice weekly. Despite these efforts, she gained one pound in the last six weeks, likely due to a recent prednisone course for bronchitis. She is on Lomira and topiramate for weight management. Journaling is challenging for her, but its importance was emphasized. Strategies to simplify journaling were discussed, and a handout for eating out options was provided. She is considering switching from Lomira tablets to capsules for convenience, understanding that Debra Parker is a controlled substance requiring continuity with the same prescriber. - Switch Phentermine to 15 mg capsule. - Refill Topamax. - Provide handout for eating out options. - Encourage continuation of journaling and adherence to dietary plan. - Discuss structured meal plan (Category 3) and provide a grocery list.  Prediabetes Her hemoglobin A1c increased from 5.8 to 6.2, indicating progression towards diabetes. Elevated fasting insulin suggests insulin resistance. The role of insulin in weight gain and genetic predisposition to diabetes was explained, as both parents have type 2 diabetes. She has over a 90% chance of developing diabetes due to family history. Metformin was recommended to improve insulin sensitivity and delay diabetes onset. - Start  metformin 500 mg once daily with the first meal.  Hyperlipidemia Her LDL cholesterol increased from 116 to 121, despite improvements in HDL and triglycerides. The increase in LDL is likely genetic, given her family history of high cholesterol. Dietary management was emphasized, but medication may be necessary in the future. - Continue dietary management and monitor lipid levels.  Vitamin D Deficiency Chronic vitamin D deficiency with current level at 26, which remains low despite prescription vitamin D2. She reports fatigue, likely due to low vitamin D levels. A multivitamin with vitamin D3 was recommended to complement the current regimen. - Continue prescription vitamin D2. - Add a multivitamin with at least 2000 IU of vitamin D.  Follow-up Follow-up is necessary to monitor progress with the new treatment plan and reassess her conditions. - Schedule follow-up with Dr. Lucretia Kern in 3-4 weeks.      She was informed of  the importance of frequent follow up visits to maximize her success with intensive lifestyle modifications for her multiple health conditions.    Quillian Quince, MD

## 2023-07-09 ENCOUNTER — Other Ambulatory Visit (HOSPITAL_COMMUNITY): Payer: Self-pay

## 2023-07-09 DIAGNOSIS — M25512 Pain in left shoulder: Secondary | ICD-10-CM | POA: Diagnosis not present

## 2023-07-09 MED ORDER — METHOCARBAMOL 500 MG PO TABS
500.0000 mg | ORAL_TABLET | Freq: Three times a day (TID) | ORAL | 1 refills | Status: AC | PRN
Start: 2023-07-09 — End: ?
  Filled 2023-07-09: qty 40, 14d supply, fill #0

## 2023-07-10 ENCOUNTER — Encounter (INDEPENDENT_AMBULATORY_CARE_PROVIDER_SITE_OTHER): Payer: Self-pay | Admitting: Family Medicine

## 2023-07-10 ENCOUNTER — Other Ambulatory Visit (HOSPITAL_COMMUNITY): Payer: Self-pay

## 2023-07-10 ENCOUNTER — Other Ambulatory Visit (INDEPENDENT_AMBULATORY_CARE_PROVIDER_SITE_OTHER): Payer: Self-pay | Admitting: Family Medicine

## 2023-07-13 ENCOUNTER — Other Ambulatory Visit (HOSPITAL_COMMUNITY): Payer: Self-pay

## 2023-07-13 ENCOUNTER — Telehealth (INDEPENDENT_AMBULATORY_CARE_PROVIDER_SITE_OTHER): Payer: Self-pay | Admitting: Family Medicine

## 2023-07-13 ENCOUNTER — Other Ambulatory Visit (INDEPENDENT_AMBULATORY_CARE_PROVIDER_SITE_OTHER): Payer: Self-pay | Admitting: Family Medicine

## 2023-07-13 NOTE — Telephone Encounter (Signed)
**Note De-identified  [place] Obfuscation** Please advise 

## 2023-07-13 NOTE — Telephone Encounter (Signed)
 Okey Regal, please phone in RX

## 2023-07-13 NOTE — Telephone Encounter (Signed)
 Patient called in stating her script was sent in to pharmacy but then it was cancelled. She needs new script sent in to [facility] on [facility] [address] [facility] [address].

## 2023-07-13 NOTE — Telephone Encounter (Signed)
 Saw Dr Dalbert Garnet on 07/08/23

## 2023-07-14 ENCOUNTER — Other Ambulatory Visit (HOSPITAL_COMMUNITY): Payer: Self-pay

## 2023-07-14 MED ORDER — PHENTERMINE HCL 15 MG PO CAPS
15.0000 mg | ORAL_CAPSULE | Freq: Every morning | ORAL | 0 refills | Status: DC
  Filled 2023-07-14: qty 30, 30d supply, fill #0

## 2023-07-14 NOTE — Telephone Encounter (Signed)
 This was addressed in another message sent to the office by patient via Mychart.

## 2023-07-16 DIAGNOSIS — M25612 Stiffness of left shoulder, not elsewhere classified: Secondary | ICD-10-CM | POA: Diagnosis not present

## 2023-07-16 DIAGNOSIS — M25512 Pain in left shoulder: Secondary | ICD-10-CM | POA: Diagnosis not present

## 2023-08-03 DIAGNOSIS — M25512 Pain in left shoulder: Secondary | ICD-10-CM | POA: Diagnosis not present

## 2023-08-05 DIAGNOSIS — M25612 Stiffness of left shoulder, not elsewhere classified: Secondary | ICD-10-CM | POA: Diagnosis not present

## 2023-08-05 DIAGNOSIS — M25512 Pain in left shoulder: Secondary | ICD-10-CM | POA: Diagnosis not present

## 2023-08-13 DIAGNOSIS — M25512 Pain in left shoulder: Secondary | ICD-10-CM | POA: Diagnosis not present

## 2023-08-13 DIAGNOSIS — M25612 Stiffness of left shoulder, not elsewhere classified: Secondary | ICD-10-CM | POA: Diagnosis not present

## 2023-08-18 ENCOUNTER — Other Ambulatory Visit: Payer: Self-pay | Admitting: Family Medicine

## 2023-08-18 ENCOUNTER — Other Ambulatory Visit (HOSPITAL_COMMUNITY): Payer: Self-pay

## 2023-08-18 ENCOUNTER — Other Ambulatory Visit: Payer: Self-pay | Admitting: Gastroenterology

## 2023-08-18 ENCOUNTER — Other Ambulatory Visit (INDEPENDENT_AMBULATORY_CARE_PROVIDER_SITE_OTHER): Payer: Self-pay | Admitting: Family Medicine

## 2023-08-18 ENCOUNTER — Other Ambulatory Visit: Payer: Self-pay

## 2023-08-18 DIAGNOSIS — M25512 Pain in left shoulder: Secondary | ICD-10-CM | POA: Diagnosis not present

## 2023-08-18 DIAGNOSIS — E559 Vitamin D deficiency, unspecified: Secondary | ICD-10-CM

## 2023-08-18 DIAGNOSIS — R7303 Prediabetes: Secondary | ICD-10-CM

## 2023-08-18 MED ORDER — VITAMIN D (ERGOCALCIFEROL) 1.25 MG (50000 UNIT) PO CAPS
50000.0000 [IU] | ORAL_CAPSULE | ORAL | 0 refills | Status: DC
Start: 2023-08-18 — End: 2023-12-17
  Filled 2023-08-18: qty 12, 84d supply, fill #0

## 2023-08-19 ENCOUNTER — Encounter (HOSPITAL_COMMUNITY): Payer: Self-pay

## 2023-08-19 ENCOUNTER — Other Ambulatory Visit: Payer: Self-pay

## 2023-08-19 ENCOUNTER — Other Ambulatory Visit (HOSPITAL_COMMUNITY): Payer: Self-pay

## 2023-08-19 MED ORDER — PANTOPRAZOLE SODIUM 40 MG PO TBEC
40.0000 mg | DELAYED_RELEASE_TABLET | Freq: Every day | ORAL | 0 refills | Status: DC
  Filled 2023-08-19: qty 90, 90d supply, fill #0

## 2023-08-20 ENCOUNTER — Other Ambulatory Visit: Payer: Self-pay

## 2023-08-20 ENCOUNTER — Ambulatory Visit (INDEPENDENT_AMBULATORY_CARE_PROVIDER_SITE_OTHER): Admitting: Family Medicine

## 2023-08-20 ENCOUNTER — Encounter (INDEPENDENT_AMBULATORY_CARE_PROVIDER_SITE_OTHER): Payer: Self-pay | Admitting: Family Medicine

## 2023-08-20 ENCOUNTER — Other Ambulatory Visit (HOSPITAL_COMMUNITY): Payer: Self-pay

## 2023-08-20 VITALS — BP 117/75 | HR 66 | Temp 99.1°F | Ht 64.0 in | Wt 262.0 lb

## 2023-08-20 DIAGNOSIS — M25512 Pain in left shoulder: Secondary | ICD-10-CM | POA: Diagnosis not present

## 2023-08-20 DIAGNOSIS — Z6841 Body Mass Index (BMI) 40.0 and over, adult: Secondary | ICD-10-CM | POA: Diagnosis not present

## 2023-08-20 MED ORDER — PHENTERMINE HCL 15 MG PO CAPS
15.0000 mg | ORAL_CAPSULE | ORAL | 0 refills | Status: DC
  Filled 2023-08-20: qty 30, 30d supply, fill #0

## 2023-08-20 MED ORDER — TOPIRAMATE 50 MG PO TABS
150.0000 mg | ORAL_TABLET | Freq: Two times a day (BID) | ORAL | 0 refills | Status: DC
Start: 2023-08-20 — End: 2023-11-23
  Filled 2023-08-20: qty 540, 90d supply, fill #0

## 2023-08-20 MED ORDER — PHENTERMINE HCL 15 MG PO CAPS
15.0000 mg | ORAL_CAPSULE | Freq: Every morning | ORAL | 0 refills | Status: DC
  Filled 2023-08-20 (×2): qty 30, 30d supply, fill #0

## 2023-08-20 NOTE — Progress Notes (Signed)
 SUBJECTIVE:  Chief Complaint: Obesity  Interim History: Patient here to follow up since 07/08/23.  She hurt her arm since February and she felt a pull or pop and she has been going to [facility] and got an ultrasound guided injection two days ago.  She has significant ROM limitation.  She mentions the injection has not increased her range of motion or decreased her pain. She has not been able to get any exercise in.  She has been trying to get baked or grilled food over the last few weeks.  No upcoming plans for Memorial Day weekend. She has an appointment with her surgeon on June 3rd.  Debra Parker is here to discuss her progress with her obesity treatment plan. She is on the Category 3 Plan and states she is following her eating plan approximately 90 % of the time. She states she is not currently exercising.   OBJECTIVE: Visit Diagnoses: Problem List Items Addressed This Visit       Other   Morbid obesity (HCC)   Anthropometric Measurements Height: 5\' 4"  (1.626 m) Weight: 262 lb (118.8 kg) BMI (Calculated): 44.95 Weight at Last Visit: 263 lb Weight Lost Since Last Visit: 1 lb Weight Gained Since Last Visit: 0 Starting Weight: 281 lb Total Weight Loss (lbs): 19 lb (8.618 kg) Body Composition  Body Fat %: 51.2 % Fat Mass (lbs): 134.6 lbs Muscle Mass (lbs): 121.8 lbs Total Body Water (lbs): 89.8 lbs Visceral Fat Rating : 19 Other Clinical Data Fasting: No Labs: No Today's Visit #: 17 Starting Date: 01/15/22       Relevant Medications   topiramate  (TOPAMAX ) 50 MG tablet   phentermine  15 MG capsule   phentermine  15 MG capsule   BMI 45.0-49.9, adult (HCC) - Primary   Relevant Medications   phentermine  15 MG capsule   phentermine  15 MG capsule   Acute shoulder pain   Patient has appointment to see surgeon on June 3.  At this time after reviewing previous notes from orthopedics it appears as though surgery is going to be the most likely treatment plan as patient has failed  all other conservative treatment modalities.  Patient was instructed to follow-up postoperatively 6 to 8 weeks when she is cleared to drive.  Will review progress notes prior to next appointment.       No data recorded       08/20/2023    3:00 PM 07/08/2023    3:00 PM 07/01/2023    6:14 PM  Vitals with BMI  Height 5\' 4"  5\' 4"    Weight 262 lbs 263 lbs   BMI 44.95 45.12   Systolic 117 131 295  Diastolic 75 75 73  Pulse 66 77 77      ASSESSMENT AND PLAN:  Diet: Nicloe is currently in the action stage of change. As such, her goal is to continue with weight loss efforts and has agreed to keeping a food journal and adhering to recommended goals of 1400-1500 calories and 95 or more grams of protein daily.  Patient to start food log or journaling meal plan.  The initial goal will be to habitually log or journal for at least 4 days a week.  The expectation it that patient may not initially meet calorie or protein goals as the nturitional understanding of food intake is begun.  We discussed the 10:1 ratio when reading a food label.  Patient agrees to keep a food log either electronically or on paper and bring to the next  appointment to be able to dissect and discuss it with provider.     Exercise:  All adults should avoid inactivity. Some activity is better than none, and adults who participate in any amount of physical activity, gain some health benefits.  Behavior Modification:  We discussed the following Behavioral Modification Strategies today: increasing lean protein intake, decreasing simple carbohydrates, increasing vegetables, meal planning and cooking strategies, keeping healthy foods in the home, avoiding temptations, planning for success, and keep a strict food journal. We discussed various medication options to help Letty with her weight loss efforts and we both agreed to continue phentermine  and topiramate  at current doses 2 months of prescriptions were sent into  pharmacy.  Return in about 5 weeks (around 09/24/2023).   She was informed of the importance of frequent follow up visits to maximize her success with intensive lifestyle modifications for her multiple health conditions.  Attestation Statements:   Reviewed by clinician on day of visit: allergies, medications, problem list, medical history, surgical history, family history, social history, and previous encounter notes.     Donaciano Frizzle, MD

## 2023-08-31 ENCOUNTER — Encounter (INDEPENDENT_AMBULATORY_CARE_PROVIDER_SITE_OTHER): Payer: Self-pay | Admitting: Family Medicine

## 2023-08-31 DIAGNOSIS — M25519 Pain in unspecified shoulder: Secondary | ICD-10-CM | POA: Insufficient documentation

## 2023-08-31 NOTE — Assessment & Plan Note (Signed)
 Anthropometric Measurements Height: 5\' 4"  (1.626 m) Weight: 262 lb (118.8 kg) BMI (Calculated): 44.95 Weight at Last Visit: 263 lb Weight Lost Since Last Visit: 1 lb Weight Gained Since Last Visit: 0 Starting Weight: 281 lb Total Weight Loss (lbs): 19 lb (8.618 kg) Body Composition  Body Fat %: 51.2 % Fat Mass (lbs): 134.6 lbs Muscle Mass (lbs): 121.8 lbs Total Body Water (lbs): 89.8 lbs Visceral Fat Rating : 19 Other Clinical Data Fasting: No Labs: No Today's Visit #: 17 Starting Date: 01/15/22

## 2023-08-31 NOTE — Assessment & Plan Note (Signed)
 Patient has appointment to see surgeon on June 3.  At this time after reviewing previous notes from orthopedics it appears as though surgery is going to be the most likely treatment plan as patient has failed all other conservative treatment modalities.  Patient was instructed to follow-up postoperatively 6 to 8 weeks when she is cleared to drive.  Will review progress notes prior to next appointment.

## 2023-09-01 ENCOUNTER — Other Ambulatory Visit (HOSPITAL_COMMUNITY): Payer: Self-pay

## 2023-09-01 ENCOUNTER — Other Ambulatory Visit (INDEPENDENT_AMBULATORY_CARE_PROVIDER_SITE_OTHER): Payer: Self-pay | Admitting: Internal Medicine

## 2023-09-01 DIAGNOSIS — R7303 Prediabetes: Secondary | ICD-10-CM

## 2023-09-01 MED ORDER — METFORMIN HCL 500 MG PO TABS
500.0000 mg | ORAL_TABLET | Freq: Every day | ORAL | 0 refills | Status: DC
  Filled 2023-09-01: qty 30, 30d supply, fill #0

## 2023-09-01 NOTE — Progress Notes (Signed)
 Refill

## 2023-09-01 NOTE — Telephone Encounter (Signed)
 LAST APPOINTMENT DATE:08/20/23 NEXT APPOINTMENT DATE: 10/01/23   [facility] [address] [place] [address] Phone: [phone] Fax: [phone]  [id number] - [place], [place] - [address] AT [facility] OF [address] [place] [address] Phone: [phone] Fax: [phone]  [facility] [address] [place] - [address] [place] [address] [place] [address]  [place] [address] Phone: [phone] Fax: [phone]  Patient is requesting a refill of the following medications: No prescriptions requested or ordered in this encounter   Date last filled: 07/08/23 Previously prescribed by Dr Alvia Awkward  Lab Results      Component                Value               Date                      HGBA1C                   6.2 (H)             06/01/2023                HGBA1C                   5.8 (A)             10/01/2022                HGBA1C                   5.8 (A)             04/01/2022           Lab Results      Component                Value               Date                      LDLCALC                  121 (H)             06/01/2023                CREATININE               0.82                06/01/2023           Lab Results      Component                Value               Date                      VD25OH                   19.1 (L)            06/01/2023                VD25OH                   18.25 (L)  04/01/2022                VD25OH                   14.82 (L)           12/31/2021            BP Readings from Last 3 Encounters: 08/20/23 : 117/75 07/08/23 : 131/75 07/01/23 : 034/74

## 2023-09-08 DIAGNOSIS — Z6841 Body Mass Index (BMI) 40.0 and over, adult: Secondary | ICD-10-CM | POA: Diagnosis not present

## 2023-09-08 DIAGNOSIS — M75102 Unspecified rotator cuff tear or rupture of left shoulder, not specified as traumatic: Secondary | ICD-10-CM | POA: Diagnosis not present

## 2023-09-11 ENCOUNTER — Other Ambulatory Visit (HOSPITAL_COMMUNITY): Payer: Self-pay

## 2023-10-01 ENCOUNTER — Ambulatory Visit (INDEPENDENT_AMBULATORY_CARE_PROVIDER_SITE_OTHER): Admitting: Family Medicine

## 2023-10-04 NOTE — Progress Notes (Signed)
 COVID Vaccine received:  []  No [x]  Yes Date of any COVID positive Test in last 90 days:  PCP - Heron Sharper, MD  Cardiologist -   Chest x-ray - 12-10-2021  2v  Epic EKG -  01-15-2022   Will repeat Stress Test -  ECHO -  Cardiac Cath -  CT Coronary Calcium score:   Pacemaker / ICD device [x]  No []  Yes   Spinal Cord Stimulator:[x]  No []  Yes       History of Sleep Apnea? []  No [x]  Yes   CPAP used?- []  No [x]  Yes    Does the patient monitor blood sugar?   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  []  No   []    Yes   Anesthesia review: Pre-DM. HTN- no meds, GERD, phentermine  use, OSA-CPAP, migraines,   Patient denies shortness of breath, fever, cough and chest pain at PAT appointment.  Patient verbalized understanding and agreement to the Pre-Surgical Instructions that were given to them at this PAT appointment. Patient was also educated of the need to review these PAT instructions again prior to her surgery.I reviewed the appropriate phone numbers to call if they have any and questions or concerns.

## 2023-10-04 NOTE — Patient Instructions (Addendum)
 SURGICAL WAITING ROOM VISITATION Patients having surgery or a procedure may have no more than 2 support people in the waiting area - these visitors may rotate in the visitor waiting room.   If the patient needs to stay at the hospital during part of their recovery, the visitor guidelines for inpatient rooms apply.  PRE-OP VISITATION  Pre-op nurse will coordinate an appropriate time for 1 support person to accompany the patient in pre-op.  This support person may not rotate.  This visitor will be contacted when the time is appropriate for the visitor to come back in the pre-op area.  Please refer to the [facility] website for the visitor guidelines for Inpatients (after your surgery is over and you are in a regular room).  You are not required to quarantine at this time prior to your surgery. However, you must do this: Hand Hygiene often Do NOT share personal items Notify your provider if you are in close contact with someone who has COVID or you develop fever 100.4 or greater, new onset of sneezing, cough, sore throat, shortness of breath or body aches.  If you test positive for Covid or have been in contact with anyone that has tested positive in the last 10 days please notify you surgeon.    Your procedure is scheduled on:  Friday  October 16, 2023  Report to [facility] Main Entrance: [address] entrance where the [employer] is available.   Report to admitting at:  07:30   AM  Call this number if you have any questions or problems the morning of surgery [phone]  FOLLOW ANY ADDITIONAL PRE OP INSTRUCTIONS YOU RECEIVED FROM YOUR SURGEON'S OFFICE!!!  Do not eat food after Midnight the night prior to your surgery/procedure.  After Midnight you may have the following liquids until   06:45 AM DAY OF SURGERY  Clear Liquid Diet Water Black Coffee (sugar ok, NO MILK/CREAM OR CREAMERS)  Tea (sugar ok, NO MILK/CREAM OR CREAMERS) regular and decaf                              Plain Jell-O  with no fruit (NO RED)                                           Fruit ices (not with fruit pulp, NO RED)                                     Popsicles (NO RED)                                                                  Juice: NO CITRUS JUICES: only apple, WHITE grape, WHITE cranberry Sports drinks like Gatorade or Powerade (NO RED)                   The day of surgery:  Drink ONE (1) Pre-Surgery G2 at   06:45 AM the morning of surgery. Drink in one sitting. Do not sip.  This drink was given  to you during your hospital pre-op appointment visit. Nothing else to drink after completing the Pre-Surgery  G2 : No candy, chewing gum or throat lozenges.     Oral Hygiene is also important to reduce your risk of infection.        Remember - BRUSH YOUR TEETH THE MORNING OF SURGERY WITH YOUR REGULAR TOOTHPASTE  Do NOT smoke after Midnight the night before surgery.  STOP TAKING all Vitamins, Herbs and supplements 1 week before your surgery.   Take ONLY these medicines the morning of surgery with A SIP OF WATER: Pantoprazole , Topiramate , and Tylenol  if needed for pain. You may use your Albuterol  inhaler if needed. Pleas bring your inhaler with you on the day of surgery.  If You have been diagnosed with Sleep Apnea - Bring CPAP mask and tubing day of surgery. We will provide you with a CPAP machine on the day of your surgery.                   You may not have any metal on your body including hair pins, jewelry, and body piercing  Do not wear make-up, lotions, powders, perfumes or deodorant  Do not wear nail polish including gel and S&S, artificial / acrylic nails, or any other type of covering on natural nails including finger and toenails. If you have artificial nails, gel coating, etc., that needs to be removed by a nail salon, Please have this removed prior to surgery. Not doing so may mean that your surgery could be cancelled or delayed if the Surgeon or anesthesia staff  feels like they are unable to monitor you safely.   Do not shave 48 hours prior to surgery to avoid nicks in your skin which may contribute to postoperative infections.   Contacts, Hearing Aids, dentures or bridgework may not be worn into surgery. DENTURES WILL BE REMOVED PRIOR TO SURGERY PLEASE DO NOT APPLY Poly grip OR ADHESIVES!!!  Patients discharged on the day of surgery will not be allowed to drive home.  Someone NEEDS to stay with you for the first 24 hours after anesthesia.  Do not bring your home medications to the hospital. The Pharmacy will dispense medications listed on your medication list to you during your admission in the Hospital.  Special Instructions: Bring a copy of your healthcare power of attorney and living will documents the day of surgery, if you wish to have them scanned into your [facility]- EPIC  Please read over the following fact sheets you were given: IF YOU HAVE QUESTIONS ABOUT YOUR PRE-OP INSTRUCTIONS, PLEASE CALL [phone]   [facility] - Preparing for Surgery Before surgery, you can play an important role.  Because skin is not sterile, your skin needs to be as free of germs as possible.  You can reduce the number of germs on your skin by washing with CHG (chlorahexidine gluconate) soap before surgery.  CHG is an antiseptic cleaner which kills germs and bonds with the skin to continue killing germs even after washing. Please DO NOT use if you have an allergy to CHG or antibacterial soaps.  If your skin becomes reddened/irritated stop using the CHG and inform your nurse when you arrive at Short Stay. Do not shave (including legs and underarms) for at least 48 hours prior to the first CHG shower.  You may shave your face/neck.  Please follow these instructions carefully:  1.  Shower with CHG Soap the night before surgery and the  morning of surgery.  2.  If you choose to wash your hair, wash your hair first as usual with your normal   shampoo.  3.  After you shampoo, rinse your hair and body thoroughly to remove the shampoo.                             4.  Use CHG as you would any other liquid soap.  You can apply chg directly to the skin and wash.  Gently with a scrungie or clean washcloth.  5.  Apply the CHG Soap to your body ONLY FROM THE NECK DOWN.   Do not use on face/ open                           Wound or open sores. Avoid contact with eyes, ears mouth and genitals (private parts).                       Wash face,  Genitals (private parts) with your normal soap.             6.  Wash thoroughly, paying special attention to the area where your  surgery  will be performed.  7.  Thoroughly rinse your body with warm water from the neck down.  8.  DO NOT shower/wash with your normal soap after using and rinsing off the CHG Soap.            9.  Pat yourself dry with a clean towel.            10.  Wear clean pajamas.            11.  Place clean sheets on your bed the night of your first shower and do not  sleep with pets.  ON THE DAY OF SURGERY : Do not apply any lotions/deodorants the morning of surgery.  Please wear clean clothes to the hospital/surgery center.     FAILURE TO FOLLOW THESE INSTRUCTIONS MAY RESULT IN THE CANCELLATION OF YOUR SURGERY  PATIENT SIGNATURE_________________________________  NURSE SIGNATURE__________________________________  ________________________________________________________________________        Debra Parker    An incentive spirometer is a tool that can help keep your lungs clear and active. This tool measures how well you are filling your lungs with each breath. Taking long deep breaths may help reverse or decrease the chance of developing breathing (pulmonary) problems (especially infection) following: A long period of time when you are unable to move or be active. BEFORE THE PROCEDURE  If the spirometer includes an indicator to show your best effort, your nurse  or respiratory therapist will set it to a desired goal. If possible, sit up straight or lean slightly forward. Try not to slouch. Hold the incentive spirometer in an upright position. INSTRUCTIONS FOR USE  Sit on the edge of your bed if possible, or sit up as far as you can in bed or on a chair. Hold the incentive spirometer in an upright position. Breathe out normally. Place the mouthpiece in your mouth and seal your lips tightly around it. Breathe in slowly and as deeply as possible, raising the piston or the ball toward the top of the column. Hold your breath for 3-5 seconds or for as long as possible. Allow the piston or ball to fall to the bottom of the column. Remove the mouthpiece from your mouth and breathe out normally. Rest for  a few seconds and repeat Steps 1 through 7 at least 10 times every 1-2 hours when you are awake. Take your time and take a few normal breaths between deep breaths. The spirometer may include an indicator to show your best effort. Use the indicator as a goal to work toward during each repetition. After each set of 10 deep breaths, practice coughing to be sure your lungs are clear. If you have an incision (the cut made at the time of surgery), support your incision when coughing by placing a pillow or rolled up towels firmly against it. Once you are able to get out of bed, walk around indoors and cough well. You may stop using the incentive spirometer when instructed by your caregiver.  RISKS AND COMPLICATIONS Take your time so you do not get dizzy or light-headed. If you are in pain, you may need to take or ask for pain medication before doing incentive spirometry. It is harder to take a deep breath if you are having pain. AFTER USE Rest and breathe slowly and easily. It can be helpful to keep track of a log of your progress. Your caregiver can provide you with a simple table to help with this. If you are using the spirometer at home, follow these  instructions: SEEK MEDICAL CARE IF:  You are having difficultly using the spirometer. You have trouble using the spirometer as often as instructed. Your pain medication is not giving enough relief while using the spirometer. You develop fever of 100.5 F (38.1 C) or higher.                                                                                                    SEEK IMMEDIATE MEDICAL CARE IF:  You cough up bloody sputum that had not been present before. You develop fever of 102 F (38.9 C) or greater. You develop worsening pain at or near the incision site. MAKE SURE YOU:  Understand these instructions. Will watch your condition. Will get help right away if you are not doing well or get worse. Document Released: 08/04/2006 Document Revised: 06/16/2011 Document Reviewed: 10/05/2006 [facility] Patient Information 2014 [place], [place].

## 2023-10-06 ENCOUNTER — Other Ambulatory Visit: Payer: Self-pay

## 2023-10-06 ENCOUNTER — Encounter (HOSPITAL_COMMUNITY): Payer: Self-pay

## 2023-10-06 ENCOUNTER — Encounter (HOSPITAL_COMMUNITY)
Admission: RE | Admit: 2023-10-06 | Discharge: 2023-10-06 | Disposition: A | Discharge status: Home / Self Care | Source: Ambulatory Visit | Attending: Orthopedic Surgery | Admitting: Orthopedic Surgery

## 2023-10-06 VITALS — BP 114/67 | HR 70 | Temp 98.7°F | Resp 22 | Ht 65.0 in | Wt 269.0 lb

## 2023-10-06 DIAGNOSIS — T505X5S Adverse effect of appetite depressants, sequela: Secondary | ICD-10-CM | POA: Diagnosis not present

## 2023-10-06 DIAGNOSIS — R9431 Abnormal electrocardiogram [ECG] [EKG]: Secondary | ICD-10-CM | POA: Diagnosis not present

## 2023-10-06 DIAGNOSIS — R7303 Prediabetes: Secondary | ICD-10-CM | POA: Diagnosis not present

## 2023-10-06 DIAGNOSIS — I10 Essential (primary) hypertension: Secondary | ICD-10-CM | POA: Diagnosis not present

## 2023-10-06 DIAGNOSIS — Z01818 Encounter for other preprocedural examination: Secondary | ICD-10-CM | POA: Diagnosis not present

## 2023-10-06 HISTORY — DX: Prediabetes: R73.03

## 2023-10-06 HISTORY — DX: Personal history of urinary calculi: Z87.442

## 2023-10-06 HISTORY — DX: Pneumonia, unspecified organism: J18.9

## 2023-10-06 LAB — COMPREHENSIVE METABOLIC PANEL WITH GFR
ALT: 19 U/L (ref 0–44)
AST: 20 U/L (ref 15–41)
Albumin: 3.7 g/dL (ref 3.5–5.0)
Alkaline Phosphatase: 61 U/L (ref 38–126)
Anion gap: 8 (ref 5–15)
BUN: 10 mg/dL (ref 6–20)
CO2: 24 mmol/L (ref 22–32)
Calcium: 8.7 mg/dL — ABNORMAL LOW (ref 8.9–10.3)
Chloride: 104 mmol/L (ref 98–111)
Creatinine, Ser: 0.64 mg/dL (ref 0.44–1.00)
GFR, Estimated: >60 mL/min (ref >60)
Glucose, Bld: 91 mg/dL (ref 70–99)
Potassium: 3.8 mmol/L (ref 3.5–5.1)
Sodium: 136 mmol/L (ref 135–145)
Total Bilirubin: 0.4 mg/dL (ref 0.0–1.2)
Total Protein: 7.2 g/dL (ref 6.5–8.1)

## 2023-10-06 LAB — CBC
HCT: 37.9 % (ref 36.0–46.0)
Hemoglobin: 12.4 g/dL (ref 12.0–15.0)
MCH: 29.1 pg (ref 26.0–34.0)
MCHC: 32.7 g/dL (ref 30.0–36.0)
MCV: 89 fL (ref 80.0–100.0)
Platelets: 283 10*3/uL (ref 150–400)
RBC: 4.26 MIL/uL (ref 3.87–5.11)
RDW: 12.7 % (ref 11.5–15.5)
WBC: 11.4 10*3/uL — ABNORMAL HIGH (ref 4.0–10.5)
nRBC: 0 % (ref 0.0–0.2)

## 2023-10-15 NOTE — Anesthesia Preprocedure Evaluation (Addendum)
 Anesthesia Evaluation  Patient identified by MRN, date of birth, ID band Patient awake    Reviewed: Allergy & Precautions, NPO status , Patient's Chart, lab work & pertinent test results  History of Anesthesia Complications Negative for: history of anesthetic complications  Airway Mallampati: II  TM Distance: >3 FB Neck ROM: Full    Dental  (+) Dental Advisory Given   Pulmonary neg shortness of breath, sleep apnea (does not use CPAP) , neg COPD, neg recent URI   Pulmonary exam normal breath sounds clear to auscultation       Cardiovascular (-) hypertension(-) angina (-) Past MI, (-) Cardiac Stents and (-) CABG (-) dysrhythmias  Rhythm:Regular Rate:Normal  HLD   Neuro/Psych  Headaches, neg Seizures PSYCHIATRIC DISORDERS (eating disorder)         GI/Hepatic Neg liver ROS,GERD  Medicated,,H/o diveritculitis   Endo/Other  neg diabetes  Class 3 obesityPre-diabetes  Renal/GU Renal disease (h/o stones)     Musculoskeletal   Abdominal  (+) + obese  Peds  Hematology negative hematology ROS (+) Lab Results      Component                Value               Date                      WBC                      11.4 (H)            10/06/2023                HGB                      12.4                10/06/2023                HCT                      37.9                10/06/2023                MCV                      89.0                10/06/2023                PLT                      283                 10/06/2023              Anesthesia Other Findings Last phentermine : 2-3 weeks ago  Reproductive/Obstetrics                              Anesthesia Physical Anesthesia Plan  ASA: 3  Anesthesia Plan: General   Post-op Pain Management: Regional block* and Tylenol  PO (pre-op)*   Induction: Intravenous  PONV Risk Score and Plan: 3 and Ondansetron , Dexamethasone , Midazolam  and Treatment may vary  due to age or medical condition  Airway Management Planned: Oral ETT  Additional Equipment:   Intra-op Plan:   Post-operative Plan: Extubation in OR  Informed Consent: I have reviewed the patients History and Physical, chart, labs and discussed the procedure including the risks, benefits and alternatives for the proposed anesthesia with the patient or authorized representative who has indicated his/her understanding and acceptance.     Dental advisory given  Plan Discussed with: CRNA and Anesthesiologist  Anesthesia Plan Comments: (Risks of general anesthesia discussed including, but not limited to, sore throat, hoarse voice, chipped/damaged teeth, injury to vocal cords, nausea and vomiting, allergic reactions, lung infection, heart attack, stroke, and death. All questions answered.  Discussed potential risks of nerve blocks including, but not limited to, infection, bleeding, nerve damage, seizures, pneumothorax, respiratory depression, and potential failure of the block. Alternatives to nerve blocks discussed. All questions answered. )         Anesthesia Quick Evaluation

## 2023-10-16 ENCOUNTER — Ambulatory Visit (HOSPITAL_BASED_OUTPATIENT_CLINIC_OR_DEPARTMENT_OTHER): Payer: Self-pay | Admitting: Anesthesiology

## 2023-10-16 ENCOUNTER — Other Ambulatory Visit (HOSPITAL_COMMUNITY): Payer: Self-pay

## 2023-10-16 ENCOUNTER — Other Ambulatory Visit: Payer: Self-pay

## 2023-10-16 ENCOUNTER — Ambulatory Visit (HOSPITAL_COMMUNITY)
Admission: RE | Admit: 2023-10-16 | Discharge: 2023-10-16 | Disposition: A | Discharge status: Home / Self Care | Source: Ambulatory Visit | Attending: Orthopedic Surgery | Admitting: Orthopedic Surgery

## 2023-10-16 ENCOUNTER — Ambulatory Visit (HOSPITAL_COMMUNITY): Payer: Self-pay | Admitting: Anesthesiology

## 2023-10-16 ENCOUNTER — Encounter (HOSPITAL_COMMUNITY): Payer: Self-pay | Admitting: Orthopedic Surgery

## 2023-10-16 ENCOUNTER — Encounter (HOSPITAL_COMMUNITY)
Admission: RE | Disposition: A | Discharge status: Home / Self Care | Payer: Self-pay | Source: Ambulatory Visit | Attending: Orthopedic Surgery

## 2023-10-16 DIAGNOSIS — G4733 Obstructive sleep apnea (adult) (pediatric): Secondary | ICD-10-CM | POA: Diagnosis not present

## 2023-10-16 DIAGNOSIS — Z6841 Body Mass Index (BMI) 40.0 and over, adult: Secondary | ICD-10-CM

## 2023-10-16 DIAGNOSIS — M7502 Adhesive capsulitis of left shoulder: Secondary | ICD-10-CM

## 2023-10-16 DIAGNOSIS — Z7984 Long term (current) use of oral hypoglycemic drugs: Secondary | ICD-10-CM | POA: Insufficient documentation

## 2023-10-16 DIAGNOSIS — Z833 Family history of diabetes mellitus: Secondary | ICD-10-CM | POA: Insufficient documentation

## 2023-10-16 DIAGNOSIS — G43909 Migraine, unspecified, not intractable, without status migrainosus: Secondary | ICD-10-CM | POA: Diagnosis not present

## 2023-10-16 DIAGNOSIS — Z87442 Personal history of urinary calculi: Secondary | ICD-10-CM | POA: Insufficient documentation

## 2023-10-16 DIAGNOSIS — Z793 Long term (current) use of hormonal contraceptives: Secondary | ICD-10-CM | POA: Insufficient documentation

## 2023-10-16 DIAGNOSIS — R7303 Prediabetes: Secondary | ICD-10-CM | POA: Insufficient documentation

## 2023-10-16 DIAGNOSIS — Z791 Long term (current) use of non-steroidal anti-inflammatories (NSAID): Secondary | ICD-10-CM | POA: Diagnosis not present

## 2023-10-16 DIAGNOSIS — Z79899 Other long term (current) drug therapy: Secondary | ICD-10-CM | POA: Insufficient documentation

## 2023-10-16 DIAGNOSIS — Z01818 Encounter for other preprocedural examination: Secondary | ICD-10-CM

## 2023-10-16 DIAGNOSIS — K219 Gastro-esophageal reflux disease without esophagitis: Secondary | ICD-10-CM | POA: Diagnosis not present

## 2023-10-16 DIAGNOSIS — G8918 Other acute postprocedural pain: Secondary | ICD-10-CM | POA: Diagnosis not present

## 2023-10-16 DIAGNOSIS — E785 Hyperlipidemia, unspecified: Secondary | ICD-10-CM | POA: Diagnosis not present

## 2023-10-16 DIAGNOSIS — E66813 Obesity, class 3: Secondary | ICD-10-CM | POA: Insufficient documentation

## 2023-10-16 HISTORY — PX: SHOULDER ARTHROSCOPY: SHX128

## 2023-10-16 LAB — GLUCOSE, CAPILLARY: Glucose-Capillary: 103 mg/dL — ABNORMAL HIGH (ref 70–99)

## 2023-10-16 LAB — POCT PREGNANCY, URINE: Preg Test, Ur: NEGATIVE

## 2023-10-16 SURGERY — ARTHROSCOPY, SHOULDER
Anesthesia: General | Site: Shoulder | Laterality: Left

## 2023-10-16 MED ORDER — FENTANYL CITRATE PF 50 MCG/ML IJ SOSY
50.0000 ug | PREFILLED_SYRINGE | Freq: Once | INTRAMUSCULAR | Status: AC
  Administered 2023-10-16: 50 ug via INTRAVENOUS
  Filled 2023-10-16: qty 2

## 2023-10-16 MED ORDER — SODIUM CHLORIDE 0.9 % IR SOLN
Status: DC | PRN
  Administered 2023-10-16: 3000 mL

## 2023-10-16 MED ORDER — CEFAZOLIN SODIUM-DEXTROSE 1-4 GM/50ML-% IV SOLN
INTRAVENOUS | Status: DC | PRN
Start: 2023-10-16 — End: 2023-10-16
  Administered 2023-10-16: 1 g via INTRAVENOUS

## 2023-10-16 MED ORDER — ROCURONIUM BROMIDE 10 MG/ML (PF) SYRINGE
PREFILLED_SYRINGE | INTRAVENOUS | Status: AC
  Filled 2023-10-16: qty 10

## 2023-10-16 MED ORDER — PROPOFOL 500 MG/50ML IV EMUL
INTRAVENOUS | Status: DC | PRN
  Administered 2023-10-16: 100 ug/kg/min via INTRAVENOUS

## 2023-10-16 MED ORDER — ONDANSETRON 4 MG PO TBDP
4.0000 mg | ORAL_TABLET | Freq: Three times a day (TID) | ORAL | 0 refills | Status: DC | PRN
  Filled 2023-10-16: qty 20, 7d supply, fill #0

## 2023-10-16 MED ORDER — ACETAMINOPHEN 500 MG PO TABS
1000.0000 mg | ORAL_TABLET | Freq: Once | ORAL | Status: AC
  Administered 2023-10-16: 1000 mg via ORAL
  Filled 2023-10-16: qty 2

## 2023-10-16 MED ORDER — PROPOFOL 1000 MG/100ML IV EMUL
INTRAVENOUS | Status: AC
  Filled 2023-10-16: qty 100

## 2023-10-16 MED ORDER — OXYCODONE HCL 5 MG/5ML PO SOLN: 5.0000 mg | Freq: Once | ORAL | Status: DC | PRN

## 2023-10-16 MED ORDER — CEFAZOLIN SODIUM-DEXTROSE 2-4 GM/100ML-% IV SOLN
2.0000 g | INTRAVENOUS | Status: AC
  Administered 2023-10-16: 2 g via INTRAVENOUS
  Filled 2023-10-16: qty 100

## 2023-10-16 MED ORDER — MIDAZOLAM HCL 2 MG/2ML IJ SOLN
1.0000 mg | Freq: Once | INTRAMUSCULAR | Status: AC
  Administered 2023-10-16: 2 mg via INTRAVENOUS
  Filled 2023-10-16: qty 2

## 2023-10-16 MED ORDER — CHLORHEXIDINE GLUCONATE 0.12 % MT SOLN
15.0000 mL | Freq: Once | OROMUCOSAL | Status: AC
  Administered 2023-10-16: 15 mL via OROMUCOSAL

## 2023-10-16 MED ORDER — LIDOCAINE HCL (PF) 2 % IJ SOLN
INTRAMUSCULAR | Status: AC
  Filled 2023-10-16: qty 5

## 2023-10-16 MED ORDER — SUGAMMADEX SODIUM 200 MG/2ML IV SOLN
INTRAVENOUS | Status: DC | PRN
Start: 2023-10-16 — End: 2023-10-16
  Administered 2023-10-16: 200 mg via INTRAVENOUS

## 2023-10-16 MED ORDER — FENTANYL CITRATE (PF) 100 MCG/2ML IJ SOLN
INTRAMUSCULAR | Status: AC
Start: 2023-10-16 — End: 2023-10-16
  Filled 2023-10-16: qty 2

## 2023-10-16 MED ORDER — LIDOCAINE HCL (CARDIAC) PF 100 MG/5ML IV SOSY
PREFILLED_SYRINGE | INTRAVENOUS | Status: DC | PRN
  Administered 2023-10-16: 100 mg via INTRAVENOUS

## 2023-10-16 MED ORDER — ONDANSETRON HCL 4 MG/2ML IJ SOLN
INTRAMUSCULAR | Status: DC | PRN
Start: 2023-10-16 — End: 2023-10-16
  Administered 2023-10-16: 4 mg via INTRAVENOUS

## 2023-10-16 MED ORDER — DIPHENHYDRAMINE HCL 50 MG/ML IJ SOLN
INTRAMUSCULAR | Status: DC | PRN
  Administered 2023-10-16: 12.5 mg via INTRAVENOUS

## 2023-10-16 MED ORDER — PROPOFOL 10 MG/ML IV BOLUS
INTRAVENOUS | Status: AC
  Filled 2023-10-16: qty 20

## 2023-10-16 MED ORDER — DEXAMETHASONE SODIUM PHOSPHATE 10 MG/ML IJ SOLN
INTRAMUSCULAR | Status: AC
  Filled 2023-10-16: qty 1

## 2023-10-16 MED ORDER — DEXAMETHASONE SODIUM PHOSPHATE 10 MG/ML IJ SOLN
INTRAMUSCULAR | Status: DC | PRN
Start: 2023-10-16 — End: 2023-10-16
  Administered 2023-10-16: 8 mg via INTRAVENOUS

## 2023-10-16 MED ORDER — FENTANYL CITRATE (PF) 100 MCG/2ML IJ SOLN
INTRAMUSCULAR | Status: DC | PRN
  Administered 2023-10-16: 50 ug via INTRAVENOUS

## 2023-10-16 MED ORDER — CEFAZOLIN SODIUM 1 G IJ SOLR
INTRAMUSCULAR | Status: AC
  Filled 2023-10-16: qty 10

## 2023-10-16 MED ORDER — ONDANSETRON HCL 4 MG/2ML IJ SOLN
INTRAMUSCULAR | Status: AC
  Filled 2023-10-16: qty 2

## 2023-10-16 MED ORDER — BUPIVACAINE LIPOSOME 1.3 % IJ SUSP
INTRAMUSCULAR | Status: DC | PRN
  Administered 2023-10-16: 10 mL via PERINEURAL

## 2023-10-16 MED ORDER — BUPIVACAINE HCL (PF) 0.5 % IJ SOLN
INTRAMUSCULAR | Status: DC | PRN
  Administered 2023-10-16: 10 mL via PERINEURAL

## 2023-10-16 MED ORDER — ORAL CARE MOUTH RINSE: 15.0000 mL | Freq: Once | OROMUCOSAL | Status: AC

## 2023-10-16 MED ORDER — OXYCODONE HCL 5 MG PO TABS: 5.0000 mg | ORAL_TABLET | Freq: Once | ORAL | Status: DC | PRN

## 2023-10-16 MED ORDER — FENTANYL CITRATE PF 50 MCG/ML IJ SOSY: 25.0000 ug | PREFILLED_SYRINGE | INTRAMUSCULAR | Status: DC | PRN

## 2023-10-16 MED ORDER — SODIUM CHLORIDE (PF) 0.9 % IJ SOLN
INTRAMUSCULAR | Status: AC
Start: 2023-10-16 — End: 2023-10-16
  Filled 2023-10-16: qty 10

## 2023-10-16 MED ORDER — ROCURONIUM BROMIDE 100 MG/10ML IV SOLN
INTRAVENOUS | Status: DC | PRN
  Administered 2023-10-16: 60 mg via INTRAVENOUS

## 2023-10-16 MED ORDER — LACTATED RINGERS IV SOLN: INTRAVENOUS | Status: DC

## 2023-10-16 MED ORDER — MIDAZOLAM HCL 2 MG/2ML IJ SOLN
INTRAMUSCULAR | Status: AC
  Filled 2023-10-16: qty 2

## 2023-10-16 MED ORDER — OXYCODONE HCL 5 MG PO TABS
5.0000 mg | ORAL_TABLET | ORAL | 0 refills | Status: DC | PRN
  Filled 2023-10-16: qty 30, 5d supply, fill #0

## 2023-10-16 MED ORDER — AMISULPRIDE (ANTIEMETIC) 5 MG/2ML IV SOLN: 10.0000 mg | Freq: Once | INTRAVENOUS | Status: DC | PRN

## 2023-10-16 MED ORDER — PROPOFOL 10 MG/ML IV BOLUS
INTRAVENOUS | Status: DC | PRN
  Administered 2023-10-16: 200 mg via INTRAVENOUS

## 2023-10-16 SURGICAL SUPPLY — 30 items
BAG COUNTER SPONGE SURGICOUNT (BAG) IMPLANT
BLADE SURG SZ11 CARB STEEL (BLADE) ×1 IMPLANT
BOOTIES KNEE HIGH SLOAN (MISCELLANEOUS) ×2 IMPLANT
BURR OVAL 8 FLU 4.0X13 (MISCELLANEOUS) ×1 IMPLANT
COVER SURGICAL LIGHT HANDLE (MISCELLANEOUS) ×1 IMPLANT
DISSECTOR 4.0MM X 13CM (MISCELLANEOUS) ×1 IMPLANT
DRAPE U-SHAPE 47X51 STRL (DRAPES) ×1 IMPLANT
DURAPREP 26ML APPLICATOR (WOUND CARE) ×1 IMPLANT
DW OUTFLOW CASSETTE/TUBE SET (MISCELLANEOUS) ×1 IMPLANT
ELECT PENCIL ROCKER SW 15FT (MISCELLANEOUS) ×1 IMPLANT
GAUZE PAD ABD 8X10 STRL (GAUZE/BANDAGES/DRESSINGS) IMPLANT
GLOVE BIO SURGEON STRL SZ7.5 (GLOVE) ×2 IMPLANT
GLOVE INDICATOR 8.0 STRL GRN (GLOVE) ×1 IMPLANT
GOWN STRL REUS W/ TWL XL LVL3 (GOWN DISPOSABLE) ×2 IMPLANT
KIT BASIN OR (CUSTOM PROCEDURE TRAY) ×2 IMPLANT
MANIFOLD NEPTUNE II (INSTRUMENTS) ×2 IMPLANT
NDL SPNL 18GX3.5 QUINCKE PK (NEEDLE) ×1 IMPLANT
NEEDLE SPNL 18GX3.5 QUINCKE PK (NEEDLE) ×1 IMPLANT
PACK SHOULDER (CUSTOM PROCEDURE TRAY) ×1 IMPLANT
PROTECTOR NERVE ULNAR (MISCELLANEOUS) ×1 IMPLANT
SLEEVE ARM SUSPENSION SYSTEM (MISCELLANEOUS) ×1 IMPLANT
SLING ARM IMMOBILIZER LRG (SOFTGOODS) IMPLANT
SLING ARM IMMOBILIZER MED (SOFTGOODS) ×1 IMPLANT
SLING S3 LATERAL DISP (MISCELLANEOUS) ×1 IMPLANT
SUT ETHILON 4 0 PS 2 18 (SUTURE) ×1 IMPLANT
SUT VIC AB 0 CT1 36 (SUTURE) ×1 IMPLANT
TOWEL OR 17X26 10 PK STRL BLUE (TOWEL DISPOSABLE) ×2 IMPLANT
TUBING ARTHROSCOPY IRRIG 16FT (MISCELLANEOUS) ×1 IMPLANT
TUBING CONNECTING 10 (TUBING) ×1 IMPLANT
WAND ABLATOR APOLLO I90 (BUR) ×1 IMPLANT

## 2023-10-16 NOTE — Discharge Instructions (Addendum)
 Orthopedic surgery discharge instructions:  -you may begin showering on postoperative day #3.  Do not submerge underwater.  You may remove your postoperative bandages on postoperative day #3.  However maintain the Steri-Strips in place.  -No lifting over 2 pounds with operateive arm.  You may use the arm immediately for activities of daily living such as bathing, washing your face and brushing your teeth, eating, and getting dressed.  Otherwise maintain your sling when you are out of the house and sleeping.  -You should aggressively begin stretching the shoulder in all planes as soon as able.  -Apply ice liberally to the shoulder throughout the day.  For mild to moderate pain use Tylenol  and Advil  as needed around-the-clock.  For breakthrough pain use oxycodone  as necessary.  -You will return to see Dr. Sharl in the office in 2 weeks for routine postoperative check with x-rays.

## 2023-10-16 NOTE — H&P (Signed)
 ORTHOPAEDIC H&P  REQUESTING PHYSICIAN: Sharl Selinda Dover, MD  PCP:  Ozell Heron HERO, MD  Chief Complaint: Left shoulder adhesive capsulitis  HPI: Debra Parker is a 38 y.o. female who complains of longstanding and recalcitrant adhesive capsulitis left shoulder.  Here today for surgical intervention.  Again no new complaints at this time.  She is ready to proceed.  Past Medical History:  Diagnosis Date   Allergy    Bronchitis    Diverticulitis 2019   GERD (gastroesophageal reflux disease)    History of kidney stones    Hypertension    when pregnant   Kidney stone    Lichen planopilaris    Migraines    Obesity    OSA (obstructive sleep apnea)    Pneumonia    Pre-diabetes    Sleep apnea    no CPAP   Vitamin D  deficiency    Past Surgical History:  Procedure Laterality Date   CESAREAN SECTION  2007   fatty pocket removed  1999   Stomach   WISDOM TOOTH EXTRACTION  2000   Social History   Socioeconomic History   Marital status: Married    Spouse name: Not on file   Number of children: 1   Years of education: Not on file   Highest education level: Associate degree: occupational, Scientist, product/process development, or vocational program  Occupational History   Occupation: LPN @ [employer]  Tobacco Use   Smoking status: Never   Smokeless tobacco: Never  Vaping Use   Vaping status: Never Used  Substance and Sexual Activity   Alcohol use: No   Drug use: No   Sexual activity: Yes    Birth control/protection: I.U.D.  Other Topics Concern   Not on file  Social History Narrative   Not on file   Social Drivers of Health   Financial Resource Strain: Low Risk  (05/09/2022)   Overall Financial Resource Strain (CARDIA)    Difficulty of Paying Living Expenses: Not hard at all  Food Insecurity: No Food Insecurity (05/09/2022)   Hunger Vital Sign    Worried About Running Out of Food in the Last Year: Never true    Ran Out of Food in the Last Year: Never true  Transportation  Needs: No Transportation Needs (05/09/2022)   PRAPARE - Administrator, Civil Service (Medical): No    Lack of Transportation (Non-Medical): No  Physical Activity: Unknown (05/09/2022)   Exercise Vital Sign    Days of Exercise per Week: Patient declined    Minutes of Exercise per Session: Not on file  Stress: No Stress Concern Present (05/09/2022)   [employer] of Occupational Health - Occupational Stress Questionnaire    Feeling of Stress : Not at all  Social Connections: Socially Integrated (05/09/2022)   Social Connection and Isolation Panel    Frequency of Communication with Friends and Family: More than three times a week    Frequency of Social Gatherings with Friends and Family: More than three times a week    Attends Religious Services: More than 4 times per year    Active Member of [employer] or Organizations: Yes    Attends Engineer, structural: More than 4 times per year    Marital Status: Married   Family History  Problem Relation Age of Onset   Hyperlipidemia Mother    Diabetes Mother    Liver disease Mother    Obesity Mother    Obesity Father    Cancer Father  Hypertension Father    Diabetes Father    Hyperlipidemia Father    Early death Sister        stillborn   Heart disease Brother    Heart attack Brother 56   Arthritis Maternal Grandmother    Diabetes Maternal Grandmother    Heart attack Maternal Grandmother    Throat cancer Maternal Grandfather    Diabetes Paternal Grandfather    Stroke Paternal Grandfather    Kidney disease Paternal Grandfather    Stomach cancer Paternal Uncle    Kidney disease Paternal Uncle    Colon cancer Neg Hx    Rectal cancer Neg Hx    Esophageal cancer Neg Hx    Allergies  Allergen Reactions   Aimovig  [Erenumab -Aooe] Hives and Rash   Sulfa Antibiotics Rash    Not sure.   Prior to Admission medications   Medication Sig Start Date End Date Taking? Authorizing Provider  acetaminophen  (TYLENOL ) 500 MG tablet  Take 500 mg by mouth every 6 (six) hours as needed for mild pain.   Yes [provider]  albuterol  (VENTOLIN  HFA) 108 (90 Base) MCG/ACT inhaler Inhale 2 puffs into the lungs every 6 (six) hours as needed for wheezing or shortness of breath. 07/03/23  Yes Gladis Elsie BROCKS, PA-C  cyanocobalamin  (VITAMIN B12) 1000 MCG/ML injection Inject 1 mL (1,000 mcg total) into the muscle every 30 (thirty) days. 10/23/22  Yes Ozell Heron HERO, MD  furosemide  (LASIX ) 20 MG tablet Take 0.5 tablets (10 mg total) by mouth daily as needed. 10/01/22  Yes Ozell Heron HERO, MD  levonorgestrel  (MIRENA , 52 MG,) 20 MCG/24HR IUD 1 Intra Uterine Device (1 each total) by Intrauterine route once for 1 dose. 11/10/18 09/30/23 Yes Koberlein, Junell C, MD  metFORMIN  (GLUCOPHAGE ) 500 MG tablet Take 1 tablet (500 mg total) by mouth daily with breakfast. 09/01/23 10/01/23 Yes Francyne Romano, MD  metoCLOPramide  (REGLAN ) 10 MG tablet Take 1 tablet (10 mg total) by mouth every 6 (six) hours. Patient taking differently: Take 10 mg by mouth every 6 (six) hours as needed for vomiting or nausea. 04/21/23  Yes Ozell Heron HERO, MD  ondansetron  (ZOFRAN -ODT) 4 MG disintegrating tablet Dissolve 1 tablet (4 mg total) in mouth every 8 (eight) hours as needed for nausea or vomiting. 05/26/22  Yes Ozell Heron HERO, MD  pantoprazole  (PROTONIX ) 40 MG tablet Take 1 tablet (40 mg total) by mouth daily. Patient needs follow up appointment for future refills. Please call [phone] to schedule an appointment. 08/19/23  Yes Charlanne Groom, MD  promethazine -dextromethorphan (PROMETHAZINE -DM) 6.25-15 MG/5ML syrup Take 5 mLs by mouth 4 (four) times daily as needed. 07/01/23  Yes Stuart Vernell Norris, PA-C  topiramate  (TOPAMAX ) 50 MG tablet Take 3 tablets (150 mg total) by mouth 2 (two) times daily in the morning and at bedtime 08/20/23 11/19/23 Yes Berkeley Adelita PENNER, MD  Vitamin D , Ergocalciferol , (DRISDOL ) 1.25 MG (50000 UNIT) CAPS capsule Take 1  capsule (50,000 Units total) by mouth every 7 (seven) days. 08/18/23  Yes Ozell Heron HERO, MD  cetirizine  (ZYRTEC  ALLERGY) 10 MG tablet Take 1 tablet (10 mg total) by mouth daily. Patient not taking: Reported on 09/30/2023 07/01/23   Stuart Vernell Norris, PA-C  fluticasone  (FLONASE ) 50 MCG/ACT nasal spray Place 1 spray into both nostrils 2 (two) times daily. Patient not taking: Reported on 09/30/2023 07/01/23   Stuart Vernell Norris, PA-C  guaiFENesin  (MUCINEX ) 600 MG 12 hr tablet Take 1 tablet (600 mg total) by mouth 2 (two) times daily. Patient not  taking: Reported on 09/30/2023 07/01/23   Stuart Vernell Norris, PA-C  meloxicam  (MOBIC ) 7.5 MG tablet Take 1 tablet (7.5 mg total) by mouth 2 (two) times daily for shoulder pain. Patient not taking: Reported on 09/30/2023 06/18/23     methocarbamol  (ROBAXIN ) 500 MG tablet Take 1 tablet (500 mg total) by mouth 3 (three) times daily as needed. Patient not taking: Reported on 09/30/2023 07/09/23     phentermine  15 MG capsule Take 1 capsule (15 mg total) by mouth every morning. Patient not taking: Reported on 09/30/2023 08/20/23   Berkeley Adelita PENNER, MD  phentermine  15 MG capsule Take 1 capsule (15 mg total) by mouth in the morning. 08/20/23   Berkeley Adelita PENNER, MD   No results found.  Positive ROS: All other systems have been reviewed and were otherwise negative with the exception of those mentioned in the HPI and as above.  Physical Exam: General: Alert, no acute distress Cardiovascular: No pedal edema Respiratory: No cyanosis, no use of accessory musculature GI: No organomegaly, abdomen is soft and non-tender Skin: No lesions in the area of chief complaint Neurologic: Sensation intact distally Psychiatric: Patient is competent for consent with normal mood and affect Lymphatic: No axillary or cervical lymphadenopathy  MUSCULOSKELETAL: Left upper extremity is warm and well-perfused.  No open wounds or lesions.  She is neurovascular intact  throughout.  Assessment: Recalcitrant adhesive capsulitis left shoulder  Plan: Plan to proceed today with arthroscopic lysis of adhesion with debridements and biceps tenotomy as necessary.  We will also manipulate the shoulder to ensure improvement in passive range of motion.  We did discuss the importance of aggressive and early postoperative range of motion to prevent refreeze.  She understands that.  We also discussed in detail risk benefits of the procedure which are included but not limited to bleeding, infection, damage to surrounding nerves and vessels, fracture, dislocation, persistent pain and swelling, recurrent adhesive capsulitis, risk of anesthesia as well as the risk of development of arthrosis.  She has provided informed consent.\     Plan for discharge home postop from PACU.    Selinda Belvie Gosling, MD Cell [phone]    10/16/2023 10:26 AM

## 2023-10-16 NOTE — Anesthesia Procedure Notes (Signed)
 Anesthesia Regional Block: Interscalene brachial plexus block   Pre-Anesthetic Checklist: , timeout performed,  Correct Patient, Correct Site, Correct Laterality,  Correct Procedure, Correct Position, site marked,  Risks and benefits discussed,  Surgical consent,  Pre-op evaluation,  At surgeon's request and post-op pain management  Laterality: Left  Prep: chloraprep       Needles:  Injection technique: Single-shot  Needle Type: Echogenic Stimulator Needle     Needle Length: 9cm  Needle Gauge: 21     Additional Needles:   Procedures:,,,, ultrasound used (permanent image in chart),,    Narrative:  Start time: 10/16/2023 10:56 AM End time: 10/16/2023 11:01 AM Injection made incrementally with aspirations every 5 mL.  Performed by: Personally  Anesthesiologist: Peggye Delon Brunswick, MD  Additional Notes: Discussed risks and benefits of nerve block including, but not limited to, prolonged and/or permanent nerve injury involving sensory and/or motor function. Monitors were applied and a time-out was performed. The nerve and associated structures were visualized under ultrasound guidance. After negative aspiration, local anesthetic was slowly injected around the nerve. There was no evidence of high pressure during the procedure. There were no paresthesias. VSS remained stable and the patient tolerated the procedure well.

## 2023-10-16 NOTE — Anesthesia Procedure Notes (Signed)
 Procedure Name: Intubation Date/Time: 10/16/2023 11:37 AM  Performed by: Landy Chip HERO, CRNAPre-anesthesia Checklist: Patient identified, Emergency Drugs available, Suction available and Patient being monitored Patient Re-evaluated:Patient Re-evaluated prior to induction Oxygen Delivery Method: Circle System Utilized Preoxygenation: Pre-oxygenation with 100% oxygen Induction Type: IV induction Ventilation: Mask ventilation without difficulty Laryngoscope Size: Mac and 4 Grade View: Grade II Tube type: Oral Tube size: 7.0 mm Number of attempts: 1 Airway Equipment and Method: Stylet Placement Confirmation: ETT inserted through vocal cords under direct vision, positive ETCO2 and breath sounds checked- equal and bilateral Secured at: 21 cm Tube secured with: Tape Dental Injury: Teeth and Oropharynx as per pre-operative assessment

## 2023-10-16 NOTE — Brief Op Note (Signed)
 10/16/2023  12:44 PM  PATIENT:  Debra Parker  38 y.o. female  PRE-OPERATIVE DIAGNOSIS:  Left shoulder adhesive capsulitis  POST-OPERATIVE DIAGNOSIS:  * No post-op diagnosis entered *  PROCEDURE:  Procedure(s): ARTHROSCOPY, SHOULDER (Left)  SURGEON:  Surgeons and Role:    * Sharl Selinda Dover, MD - Primary  PHYSICIAN ASSISTANT: Dayle Moores, PA-C   ANESTHESIA:   regional and general  EBL:  15 mL   BLOOD ADMINISTERED:none  DRAINS: none   LOCAL MEDICATIONS USED:  NONE  SPECIMEN:  No Specimen  DISPOSITION OF SPECIMEN:  N/A  COUNTS:  YES  TOURNIQUET:  * No tourniquets in log *  DICTATION: .Note written in EPIC  PLAN OF CARE: Discharge to home after PACU  PATIENT DISPOSITION:  PACU - hemodynamically stable.   Delay start of Pharmacological VTE agent (>24hrs) due to surgical blood loss or risk of bleeding: not applicable

## 2023-10-16 NOTE — Anesthesia Postprocedure Evaluation (Signed)
 Anesthesia Post Note  Patient: Debra Parker  Procedure(s) Performed: ARTHROSCOPY, SHOULDER (Left: Shoulder)     Patient location during evaluation: PACU Anesthesia Type: General Level of consciousness: awake Pain management: pain level controlled Vital Signs Assessment: post-procedure vital signs reviewed and stable Respiratory status: spontaneous breathing, nonlabored ventilation and respiratory function stable Cardiovascular status: blood pressure returned to baseline and stable Postop Assessment: no apparent nausea or vomiting Anesthetic complications: no   No notable events documented.  Last Vitals:  Vitals:   10/16/23 1418 10/16/23 1430  BP: 114/84 112/77  Pulse: (!) 59 (!) 59  Resp: 12   Temp: 36.6 C   SpO2: 93% 93%    Last Pain:  Vitals:   10/16/23 1430  TempSrc:   PainSc: 0-No pain                 Delon Aisha Arch

## 2023-10-16 NOTE — Op Note (Signed)
 Date of Surgery: 10/16/2023  INDICATIONS: Debra Parker is a 38 y.o.-year-old female with a left progressive adhesive capsulitis;  The patient did consent to the procedure after discussion of the risks and benefits.  PREOPERATIVE DIAGNOSIS: Left shoulder adhesive capsulitis  POSTOPERATIVE DIAGNOSIS: Same.  PROCEDURE:  1.  Left shoulder arthroscopic lysis of adhesion 2.  Left shoulder arthroscopic debridement of rotator interval, superior labrum, anterior labrum, arthroscopic biceps tenotomy 3.  Left shoulder manipulation under anesthesia  SURGEON: Selinda SHAUNNA Gosling, M.D.  ASSIST: Dayle Moores, PA-C  Assistant attestation:  PA Mcclung present and scrubbed for the entire procedure.  ANESTHESIA:  general, interscalene with Exparel   IV FLUIDS AND URINE: See anesthesia.  ESTIMATED BLOOD LOSS: 10 mL.  IMPLANTS: None  DRAINS: None  COMPLICATIONS: None.  DESCRIPTION OF PROCEDURE: The patient was brought to the operating room and placed supine on the operating table.  The patient had been signed prior to the procedure and this was documented. The patient had the anesthesia placed by the anesthesiologist.  A time-out was performed to confirm that this was the correct patient, site, side and location. The patient did receive antibiotics prior to the incision and was re-dosed during the procedure as needed at indicated intervals.  A tourniquet was not placed.  The patient had the operative extremity prepped and draped in the standard surgical fashion.     Prior to positioning the patient and draping, we performed an examination under anesthesia.  The shoulder was found to have firm endpoints in all planes with elevation of 90 degrees, external rotation 20 and internal rotation 10 with the arm at the 90 degrees of abduction.  After obtaining informed consent the patient was brought to the operating table and underwent satisfactory anesthesia. An exam under anesthesia revealed full range of motion.  She was placed in the right lateral decubitus position with an axillary roll and all bony prominences properly padded. A standard surgical timeout was performed.  She was placed in 10 pounds of gentle in-line suspension.    Standard posterior and anterior superior portals were established. A diagnostic evaluation of the glenohumeral joint was performed.  We identified no glenohumeral arthrosis, no loose bodies.  Significant capsulitis as well as thickening of the capsule noted, consistent with adhesive capsulitis.  Biceps tendon was erythematous at the attachment site to the superior labrum, more importantly, there was an unstable type II SLAP tear encountered as well.  This tearing of the labral tissue did propagate anteriorly a bit.  Intact subscapularis, supraspinatus, infraspinatus and teres minor.  Next we began our arthroscopic debridement.  We performed an arthroscopic biceps tenotomy with the radiofrequency wand.  Initially viewing from the posterior portal and working from the mid glenoid portal we worked from the 12 o'clock position down anteriorly to the 6 o'clock position.  Once we were satisfied with that capsular release we then switched our orientation such that the camera was in the mid glenoid portal we introduced our radiofrequency wand to the posterior portal and then worked down posterior glenoid while releasing the capsule all the way to just about the 5 o'clock position in this left shoulder.  Next, we performed our manipulation of the joint under anesthesia.  The arm was taken out of the inline traction device and manipulated.  Forward elevation to 150, external rotation to 90 and internal rotation to 90 with the arm in 90 degrees of abduction.  The arthroscope was then removed and portals closed with 3-0 Monocryl in standard fashion followed  by a sterile occlusive dressing Polar Care ice sleeve and a slingshot sling. The patient was sent to recovery in stable condition and tolerated  the procedure well  POSTOPERATIVE PLAN:  Debra Parker will be discharged home today from PACU.  She was placed in a simple sling that can be removed at her discretion.  We encouraged her to immediately move the arm with full range of motion in all planes and no restrictions.  We will see her back in the office in 2 weeks for motion check and wound check.

## 2023-10-16 NOTE — Transfer of Care (Signed)
 Immediate Anesthesia Transfer of Care Note  Patient: Debra Parker  Procedure(s) Performed: ARTHROSCOPY, SHOULDER (Left: Shoulder)  Patient Location: PACU  Anesthesia Type:General  Level of Consciousness: drowsy  Airway & Oxygen Therapy: Patient Spontanous Breathing  Post-op Assessment: Report given to RN  Post vital signs: Reviewed and stable  Last Vitals:  Vitals Value Taken Time  BP 123/78 10/16/23 12:34  Temp    Pulse 79 10/16/23 12:39  Resp 21 10/16/23 12:39  SpO2 99 % 10/16/23 12:39  Vitals shown include unfiled device data.  Last Pain:  Vitals:   10/16/23 1105  TempSrc:   PainSc: 0-No pain         Complications: No notable events documented.

## 2023-10-17 ENCOUNTER — Encounter (HOSPITAL_COMMUNITY): Payer: Self-pay | Admitting: Orthopedic Surgery

## 2023-10-19 DIAGNOSIS — M25512 Pain in left shoulder: Secondary | ICD-10-CM | POA: Diagnosis not present

## 2023-10-21 DIAGNOSIS — M25512 Pain in left shoulder: Secondary | ICD-10-CM | POA: Diagnosis not present

## 2023-10-23 DIAGNOSIS — M25512 Pain in left shoulder: Secondary | ICD-10-CM | POA: Diagnosis not present

## 2023-10-26 DIAGNOSIS — M25512 Pain in left shoulder: Secondary | ICD-10-CM | POA: Diagnosis not present

## 2023-10-27 DIAGNOSIS — M25512 Pain in left shoulder: Secondary | ICD-10-CM | POA: Diagnosis not present

## 2023-10-28 DIAGNOSIS — M25512 Pain in left shoulder: Secondary | ICD-10-CM | POA: Diagnosis not present

## 2023-10-30 DIAGNOSIS — M25512 Pain in left shoulder: Secondary | ICD-10-CM | POA: Diagnosis not present

## 2023-11-03 DIAGNOSIS — M25512 Pain in left shoulder: Secondary | ICD-10-CM | POA: Diagnosis not present

## 2023-11-06 DIAGNOSIS — M25512 Pain in left shoulder: Secondary | ICD-10-CM | POA: Diagnosis not present

## 2023-11-09 DIAGNOSIS — M25512 Pain in left shoulder: Secondary | ICD-10-CM | POA: Diagnosis not present

## 2023-11-18 DIAGNOSIS — M25512 Pain in left shoulder: Secondary | ICD-10-CM | POA: Diagnosis not present

## 2023-11-20 DIAGNOSIS — M25512 Pain in left shoulder: Secondary | ICD-10-CM | POA: Diagnosis not present

## 2023-11-22 ENCOUNTER — Other Ambulatory Visit: Payer: Self-pay

## 2023-11-22 ENCOUNTER — Emergency Department (HOSPITAL_COMMUNITY)

## 2023-11-22 ENCOUNTER — Observation Stay (HOSPITAL_COMMUNITY)
Admission: EM | Admit: 2023-11-22 | Discharge: 2023-11-23 | Disposition: A | Discharge status: Home / Self Care | Attending: Internal Medicine | Admitting: Internal Medicine

## 2023-11-22 ENCOUNTER — Encounter (HOSPITAL_COMMUNITY): Payer: Self-pay | Admitting: Emergency Medicine

## 2023-11-22 DIAGNOSIS — A419 Sepsis, unspecified organism: Principal | ICD-10-CM | POA: Insufficient documentation

## 2023-11-22 DIAGNOSIS — R509 Fever, unspecified: Secondary | ICD-10-CM | POA: Diagnosis present

## 2023-11-22 DIAGNOSIS — E871 Hypo-osmolality and hyponatremia: Secondary | ICD-10-CM | POA: Diagnosis not present

## 2023-11-22 DIAGNOSIS — U071 COVID-19: Secondary | ICD-10-CM | POA: Diagnosis not present

## 2023-11-22 DIAGNOSIS — R000 Tachycardia, unspecified: Secondary | ICD-10-CM | POA: Diagnosis not present

## 2023-11-22 DIAGNOSIS — Z6841 Body Mass Index (BMI) 40.0 and over, adult: Secondary | ICD-10-CM | POA: Diagnosis not present

## 2023-11-22 DIAGNOSIS — J1282 Pneumonia due to coronavirus disease 2019: Secondary | ICD-10-CM | POA: Diagnosis not present

## 2023-11-22 DIAGNOSIS — G43809 Other migraine, not intractable, without status migrainosus: Secondary | ICD-10-CM

## 2023-11-22 DIAGNOSIS — G43909 Migraine, unspecified, not intractable, without status migrainosus: Secondary | ICD-10-CM | POA: Insufficient documentation

## 2023-11-22 DIAGNOSIS — R0602 Shortness of breath: Secondary | ICD-10-CM | POA: Diagnosis present

## 2023-11-22 DIAGNOSIS — G4733 Obstructive sleep apnea (adult) (pediatric): Secondary | ICD-10-CM | POA: Insufficient documentation

## 2023-11-22 DIAGNOSIS — R918 Other nonspecific abnormal finding of lung field: Secondary | ICD-10-CM | POA: Diagnosis not present

## 2023-11-22 DIAGNOSIS — R112 Nausea with vomiting, unspecified: Secondary | ICD-10-CM

## 2023-11-22 DIAGNOSIS — J189 Pneumonia, unspecified organism: Secondary | ICD-10-CM

## 2023-11-22 DIAGNOSIS — R7303 Prediabetes: Secondary | ICD-10-CM | POA: Insufficient documentation

## 2023-11-22 LAB — CBC WITH DIFFERENTIAL/PLATELET
Abs Immature Granulocytes: 0.02 K/uL (ref 0.00–0.07)
Basophils Absolute: 0 K/uL (ref 0.0–0.1)
Basophils Relative: 1 %
Eosinophils Absolute: 0.1 K/uL (ref 0.0–0.5)
Eosinophils Relative: 1 %
HCT: 37.8 % (ref 36.0–46.0)
Hemoglobin: 12.8 g/dL (ref 12.0–15.0)
Immature Granulocytes: 0 %
Lymphocytes Relative: 7 %
Lymphs Abs: 0.4 K/uL — ABNORMAL LOW (ref 0.7–4.0)
MCH: 29.2 pg (ref 26.0–34.0)
MCHC: 33.9 g/dL (ref 30.0–36.0)
MCV: 86.1 fL (ref 80.0–100.0)
Monocytes Absolute: 0.4 K/uL (ref 0.1–1.0)
Monocytes Relative: 6 %
Neutro Abs: 5.4 K/uL (ref 1.7–7.7)
Neutrophils Relative %: 85 %
Platelets: 247 K/uL (ref 150–400)
RBC: 4.39 MIL/uL (ref 3.87–5.11)
RDW: 12.4 % (ref 11.5–15.5)
WBC: 6.4 K/uL (ref 4.0–10.5)
nRBC: 0 % (ref 0.0–0.2)

## 2023-11-22 LAB — COMPREHENSIVE METABOLIC PANEL WITH GFR
ALT: 18 U/L (ref 0–44)
AST: 16 U/L (ref 15–41)
Albumin: 3.6 g/dL (ref 3.5–5.0)
Alkaline Phosphatase: 66 U/L (ref 38–126)
Anion gap: 9 (ref 5–15)
BUN: 11 mg/dL (ref 6–20)
CO2: 17 mmol/L — ABNORMAL LOW (ref 22–32)
Calcium: 8.3 mg/dL — ABNORMAL LOW (ref 8.9–10.3)
Chloride: 104 mmol/L (ref 98–111)
Creatinine, Ser: 0.7 mg/dL (ref 0.44–1.00)
GFR, Estimated: >60 mL/min (ref >60)
Glucose, Bld: 102 mg/dL — ABNORMAL HIGH (ref 70–99)
Potassium: 3.3 mmol/L — ABNORMAL LOW (ref 3.5–5.1)
Sodium: 130 mmol/L — ABNORMAL LOW (ref 135–145)
Total Bilirubin: 0.2 mg/dL (ref 0.0–1.2)
Total Protein: 7.4 g/dL (ref 6.5–8.1)

## 2023-11-22 LAB — POC URINE PREG, ED: Preg Test, Ur: NEGATIVE — NL

## 2023-11-22 LAB — URINALYSIS, W/ REFLEX TO CULTURE (INFECTION SUSPECTED)
Bilirubin Urine: NEGATIVE
Glucose, UA: NEGATIVE mg/dL
Hgb urine dipstick: NEGATIVE
Ketones, ur: NEGATIVE mg/dL
Nitrite: NEGATIVE
Protein, ur: NEGATIVE mg/dL
Specific Gravity, Urine: 1.004 — ABNORMAL LOW (ref 1.005–1.030)
pH: 7 (ref 5.0–8.0)

## 2023-11-22 LAB — PROCALCITONIN: Procalcitonin: <0.1 ng/mL

## 2023-11-22 LAB — RESP PANEL BY RT-PCR (RSV, FLU A&B, COVID)  RVPGX2
Influenza A by PCR: NEGATIVE
Influenza B by PCR: NEGATIVE
Resp Syncytial Virus by PCR: NEGATIVE
SARS Coronavirus 2 by RT PCR: POSITIVE — AB

## 2023-11-22 LAB — PROTIME-INR
INR: 1 (ref 0.8–1.2)
Prothrombin Time: 13.8 s (ref 11.4–15.2)

## 2023-11-22 LAB — LACTIC ACID, PLASMA: Lactic Acid, Venous: 1.1 mmol/L (ref 0.5–1.9)

## 2023-11-22 LAB — HCG, SERUM, QUALITATIVE: Preg, Serum: NEGATIVE

## 2023-11-22 MED ORDER — POTASSIUM CHLORIDE CRYS ER 20 MEQ PO TBCR
40.0000 meq | EXTENDED_RELEASE_TABLET | Freq: Once | ORAL | Status: AC
  Administered 2023-11-22: 40 meq via ORAL
  Filled 2023-11-22: qty 2

## 2023-11-22 MED ORDER — ACETAMINOPHEN 650 MG RE SUPP: 650.0000 mg | Freq: Four times a day (QID) | RECTAL | Status: DC | PRN

## 2023-11-22 MED ORDER — LACTATED RINGERS IV BOLUS (SEPSIS)
1000.0000 mL | Freq: Once | INTRAVENOUS | Status: AC
  Administered 2023-11-22: 1000 mL via INTRAVENOUS

## 2023-11-22 MED ORDER — PANTOPRAZOLE SODIUM 40 MG PO TBEC
40.0000 mg | DELAYED_RELEASE_TABLET | Freq: Every day | ORAL | Status: DC
  Administered 2023-11-22 – 2023-11-23 (×2): 40 mg via ORAL
  Filled 2023-11-22 (×2): qty 1

## 2023-11-22 MED ORDER — LACTATED RINGERS IV SOLN: INTRAVENOUS | Status: DC

## 2023-11-22 MED ORDER — ALBUTEROL SULFATE HFA 108 (90 BASE) MCG/ACT IN AERS: 2.0000 | INHALATION_SPRAY | Freq: Four times a day (QID) | RESPIRATORY_TRACT | Status: DC | PRN

## 2023-11-22 MED ORDER — ONDANSETRON HCL 4 MG PO TABS
4.0000 mg | ORAL_TABLET | Freq: Four times a day (QID) | ORAL | Status: DC | PRN
  Administered 2023-11-23: 4 mg via ORAL
  Filled 2023-11-22: qty 1

## 2023-11-22 MED ORDER — ALBUTEROL SULFATE (2.5 MG/3ML) 0.083% IN NEBU: 2.5000 mg | INHALATION_SOLUTION | Freq: Four times a day (QID) | RESPIRATORY_TRACT | Status: DC | PRN

## 2023-11-22 MED ORDER — ONDANSETRON HCL 4 MG/2ML IJ SOLN: 4.0000 mg | Freq: Four times a day (QID) | INTRAMUSCULAR | Status: DC | PRN

## 2023-11-22 MED ORDER — KETOROLAC TROMETHAMINE 15 MG/ML IJ SOLN
15.0000 mg | Freq: Once | INTRAMUSCULAR | Status: AC
  Administered 2023-11-22: 15 mg via INTRAVENOUS
  Filled 2023-11-22: qty 1

## 2023-11-22 MED ORDER — TOPIRAMATE 25 MG PO TABS
150.0000 mg | ORAL_TABLET | Freq: Two times a day (BID) | ORAL | Status: DC
  Administered 2023-11-22 – 2023-11-23 (×2): 150 mg via ORAL
  Filled 2023-11-22 (×2): qty 6

## 2023-11-22 MED ORDER — ENOXAPARIN SODIUM 60 MG/0.6ML IJ SOSY
60.0000 mg | PREFILLED_SYRINGE | INTRAMUSCULAR | Status: DC
  Administered 2023-11-23: 60 mg via SUBCUTANEOUS
  Filled 2023-11-22: qty 0.6

## 2023-11-22 MED ORDER — ACETAMINOPHEN 325 MG PO TABS
650.0000 mg | ORAL_TABLET | Freq: Four times a day (QID) | ORAL | Status: DC | PRN
Start: 2023-11-22 — End: 2023-11-23
  Administered 2023-11-23: 650 mg via ORAL
  Filled 2023-11-22: qty 2

## 2023-11-22 MED ORDER — PHENTERMINE HCL 15 MG PO CAPS: 15.0000 mg | ORAL_CAPSULE | Freq: Every morning | ORAL | Status: DC

## 2023-11-22 MED ORDER — GUAIFENESIN-DM 100-10 MG/5ML PO SYRP
15.0000 mL | ORAL_SOLUTION | Freq: Three times a day (TID) | ORAL | Status: DC
  Administered 2023-11-22 – 2023-11-23 (×2): 15 mL via ORAL
  Filled 2023-11-22 (×3): qty 15

## 2023-11-22 MED ORDER — POLYETHYLENE GLYCOL 3350 17 G PO PACK: 17.0000 g | PACK | Freq: Every day | ORAL | Status: DC | PRN

## 2023-11-22 MED ORDER — POTASSIUM CHLORIDE IN NACL 40-0.9 MEQ/L-% IV SOLN
INTRAVENOUS | Status: AC
  Filled 2023-11-22 (×2): qty 1000

## 2023-11-22 MED ORDER — ACETAMINOPHEN 500 MG PO TABS
1000.0000 mg | ORAL_TABLET | Freq: Once | ORAL | Status: AC
  Administered 2023-11-22: 1000 mg via ORAL
  Filled 2023-11-22: qty 2

## 2023-11-22 MED ORDER — SODIUM CHLORIDE 0.9 % IV SOLN
500.0000 mg | Freq: Once | INTRAVENOUS | Status: AC
  Administered 2023-11-22: 500 mg via INTRAVENOUS
  Filled 2023-11-22: qty 5

## 2023-11-22 MED ORDER — SODIUM CHLORIDE 0.9 % IV SOLN
2.0000 g | Freq: Once | INTRAVENOUS | Status: AC
  Administered 2023-11-22: 2 g via INTRAVENOUS
  Filled 2023-11-22: qty 20

## 2023-11-22 MED ORDER — TRAMADOL HCL 50 MG PO TABS
50.0000 mg | ORAL_TABLET | Freq: Once | ORAL | Status: AC
  Administered 2023-11-22: 50 mg via ORAL
  Filled 2023-11-22: qty 1

## 2023-11-22 NOTE — Progress Notes (Signed)
 CODE SEPSIS - PHARMACY COMMUNICATION  **Broad Spectrum Antibiotics should be administered within 1 hour of Sepsis diagnosis**  Time Code Sepsis Called/Page Received: 1555  Antibiotics Ordered: Ceftriaxone , Azithromycin   Time of 1st antibiotic administration: 1649  Alan Hoe, PharmD 11/22/2023 4:20 PM

## 2023-11-22 NOTE — H&P (Addendum)
 History and Physical    Debra Parker FMW:[id number] DOB: 30-Oct-1985 DOA: 11/22/2023  PCP: Ozell Heron HERO, MD  Patient coming from: Home  I have personally briefly reviewed patient's old medical records in [facility]  Chief Complaint: Fever, cough, covid +  HPI: Debra Parker is a 38 y.o. female with medical history significant for OSA, migraines. Patient presented to the ED with complaints of fever, cough, difficulty breathing prior to arrival to the ED.  She reports fever of up to 102.9 at home, reports she took Tylenol  and then ibuprofen  for fever persisted.  She reports symptoms started today.  No vomiting no diarrhea. She reports prior history of COVID infection- she was sick for about 21 days but did not require hospitalization.  ED Course: Tmax 102.9.  Heart rate initially 135 improved to 83.  Respiratory rate 14-18.  Blood pressure systolic 102-125.  O2 sats greater than 91% on room air. COVID test positive. Sodium 130, potassium 3.3. Chest x-ray showing left lower lung opacity-atelectasis versus pneumonia. 3 L bolus given, IV ceftriaxone  and azithromycin  started. Hospitalist admit for COVID infection, pneumonia.  Review of Systems: As per HPI all other systems reviewed and negative.  Past Medical History:  Diagnosis Date   Allergy    Bronchitis    Diverticulitis 2019   GERD (gastroesophageal reflux disease)    History of kidney stones    Hypertension    when pregnant   Kidney stone    Lichen planopilaris    Migraines    Obesity    OSA (obstructive sleep apnea)    Pneumonia    Pre-diabetes    Sleep apnea    no CPAP   Vitamin D  deficiency     Past Surgical History:  Procedure Laterality Date   CESAREAN SECTION  2007   fatty pocket removed  1999   Stomach   SHOULDER ARTHROSCOPY Left 10/16/2023   Procedure: ARTHROSCOPY, SHOULDER;  Surgeon: Sharl Selinda Dover, MD;  Location: [facility];  Service: Orthopedics;  Laterality: Left;   WISDOM  TOOTH EXTRACTION  2000     reports that she has never smoked. She has never used smokeless tobacco. She reports that she does not drink alcohol and does not use drugs.  Allergies  Allergen Reactions   Aimovig  [Erenumab -Aooe] Hives and Rash   Sulfa Antibiotics Rash    Family History  Problem Relation Age of Onset   Hyperlipidemia Mother    Diabetes Mother    Liver disease Mother    Obesity Mother    Obesity Father    Cancer Father    Hypertension Father    Diabetes Father    Hyperlipidemia Father    Early death Sister        stillborn   Heart disease Brother    Heart attack Brother 58   Arthritis Maternal Grandmother    Diabetes Maternal Grandmother    Heart attack Maternal Grandmother    Throat cancer Maternal Grandfather    Diabetes Paternal Grandfather    Stroke Paternal Grandfather    Kidney disease Paternal Grandfather    Stomach cancer Paternal Uncle    Kidney disease Paternal Uncle    Colon cancer Neg Hx    Rectal cancer Neg Hx    Esophageal cancer Neg Hx     Prior to Admission medications   Medication Sig Start Date End Date Taking? Authorizing Provider  acetaminophen  (TYLENOL ) 500 MG tablet Take 500 mg by mouth every 6 (six) hours as needed for  mild pain.   Yes [provider]  albuterol  (VENTOLIN  HFA) 108 (90 Base) MCG/ACT inhaler Inhale 2 puffs into the lungs every 6 (six) hours as needed for wheezing or shortness of breath. 07/03/23  Yes Gladis Elsie BROCKS, PA-C  cyanocobalamin  (VITAMIN B12) 1000 MCG/ML injection Inject 1 mL (1,000 mcg total) into the muscle every 30 (thirty) days. 10/23/22  Yes Ozell Heron HERO, MD  furosemide  (LASIX ) 20 MG tablet Take 0.5 tablets (10 mg total) by mouth daily as needed. 10/01/22  Yes Ozell Heron HERO, MD  metFORMIN  (GLUCOPHAGE ) 500 MG tablet Take 1 tablet (500 mg total) by mouth daily with breakfast. 09/01/23 11/22/23 Yes Francyne Romano, MD  metoCLOPramide  (REGLAN ) 10 MG tablet Take 1 tablet (10 mg total) by mouth  every 6 (six) hours. Patient taking differently: Take 10 mg by mouth every 6 (six) hours as needed for vomiting or nausea. 04/21/23  Yes Ozell Heron HERO, MD  ondansetron  (ZOFRAN -ODT) 4 MG disintegrating tablet Take 1 tablet (4 mg total) by mouth every 8 (eight) hours as needed. 10/16/23  Yes Sharl Selinda Dover, MD  pantoprazole  (PROTONIX ) 40 MG tablet Take 1 tablet (40 mg total) by mouth daily. Patient needs follow up appointment for future refills. Please call [phone] to schedule an appointment. 08/19/23  Yes Charlanne Groom, MD  phentermine  15 MG capsule Take 1 capsule (15 mg total) by mouth in the morning. 08/20/23  Yes Berkeley Adelita PENNER, MD  topiramate  (TOPAMAX ) 50 MG tablet Take 3 tablets (150 mg total) by mouth 2 (two) times daily in the morning and at bedtime 08/20/23 11/22/23 Yes Berkeley Adelita PENNER, MD  Vitamin D , Ergocalciferol , (DRISDOL ) 1.25 MG (50000 UNIT) CAPS capsule Take 1 capsule (50,000 Units total) by mouth every 7 (seven) days. Patient taking differently: Take 50,000 Units by mouth every Monday. 08/18/23  Yes Ozell Heron HERO, MD  levonorgestrel  (MIRENA , 52 MG,) 20 MCG/24HR IUD 1 Intra Uterine Device (1 each total) by Intrauterine route once for 1 dose. 11/10/18 09/30/23  Koberlein, Junell C, MD  methocarbamol  (ROBAXIN ) 500 MG tablet Take 1 tablet (500 mg total) by mouth 3 (three) times daily as needed. Patient not taking: Reported on 09/30/2023 07/09/23     oxyCODONE  (ROXICODONE ) 5 MG immediate release tablet Take 1 tablet (5 mg total) by mouth every 4 (four) hours as needed for severe pain (pain score 7-10). Patient not taking: Reported on 11/22/2023 10/16/23   Sharl Selinda Dover, MD    Physical Exam: Vitals:   11/22/23 1730 11/22/23 1830 11/22/23 1900 11/22/23 1933  BP: (!) 102/56 110/62    Pulse: 95 90 94 89  Resp: 15 15 16 16   Temp:      TempSrc:      SpO2: 91% 98% 99% 97%  Weight:      Height:        Constitutional: NAD, calm, comfortable Vitals:   11/22/23  1730 11/22/23 1830 11/22/23 1900 11/22/23 1933  BP: (!) 102/56 110/62    Pulse: 95 90 94 89  Resp: 15 15 16 16   Temp:      TempSrc:      SpO2: 91% 98% 99% 97%  Weight:      Height:       Eyes: PERRL, lids and conjunctivae normal ENMT: Mucous membranes are moist. Neck: normal, supple, no masses, no thyromegaly Respiratory: clear to auscultation bilaterally, no wheezing, no crackles. Normal respiratory effort. No accessory muscle use.  Cardiovascular: Regular rate and rhythm, no murmurs / rubs / gallops. No  extremity edema.  Extremities warm. Abdomen: no tenderness, no masses palpated. No hepatosplenomegaly. Bowel sounds positive.  Musculoskeletal: no clubbing / cyanosis. No joint deformity upper and lower extremities.  Skin: no rashes, lesions, ulcers. No induration Neurologic: No facial asymmetry, moves extremities spontaneously, speech fluent.   Psychiatric: Normal judgment and insight. Alert and oriented x 3. Normal mood.   Labs on Admission: I have personally reviewed following labs and imaging studies  CBC: Recent Labs  Lab 11/22/23 1620  WBC 6.4  NEUTROABS 5.4  HGB 12.8  HCT 37.8  MCV 86.1  PLT 247   Basic Metabolic Panel: Recent Labs  Lab 11/22/23 1620  NA 130*  K 3.3*  CL 104  CO2 17*  GLUCOSE 102*  BUN 11  CREATININE 0.70  CALCIUM 8.3*   GFR: Estimated Creatinine Clearance: 121.4 mL/min (by C-G formula based on SCr of 0.7 mg/dL). Liver Function Tests: Recent Labs  Lab 11/22/23 1620  AST 16  ALT 18  ALKPHOS 66  BILITOT 0.2  PROT 7.4  ALBUMIN 3.6   Coagulation Profile: Recent Labs  Lab 11/22/23 1620  INR 1.0   Urine analysis:    Component Value Date/Time   COLORURINE STRAW (A) 11/22/2023 1555   APPEARANCEUR CLEAR 11/22/2023 1555   LABSPEC 1.004 (L) 11/22/2023 1555   PHURINE 7.0 11/22/2023 1555   GLUCOSEU NEGATIVE 11/22/2023 1555   HGBUR NEGATIVE 11/22/2023 1555   BILIRUBINUR NEGATIVE 11/22/2023 1555   KETONESUR NEGATIVE 11/22/2023 1555    PROTEINUR NEGATIVE 11/22/2023 1555   NITRITE NEGATIVE 11/22/2023 1555   LEUKOCYTESUR TRACE (A) 11/22/2023 1555    Radiological Exams on Admission: DG Chest Port 1 View Result Date: 11/22/2023 CLINICAL DATA:  Questionable sepsis - evaluate for abnormality Fever. EXAM: PORTABLE CHEST 1 VIEW COMPARISON:  12/10/2021 FINDINGS: Ill-defined left lower lobe opacity. The right lung is clear. The heart is normal in size for technique. Stable mediastinal contours. No pulmonary edema, pleural effusion, or pneumothorax. No acute osseous findings. IMPRESSION: Ill-defined left lower lobe opacity, atelectasis versus pneumonia. Electronically Signed   By: Andrea Gasman M.D.   On: 11/22/2023 16:40   EKG: Independently reviewed.  Sinus tachycardia rate 105, QTc 410.  T wave inversions in lead III and aVF.  Previous EKG shows T wave inversions in lead III only.  Assessment/Plan Principal Problem:   COVID-19 virus infection Active Problems:   Hyponatremia   Morbid obesity (HCC)   Migraine   Prediabetes   OSA (obstructive sleep apnea)  Assessment and Plan: * COVID-19 virus infection Symptoms started today with dyspnea, cough and fever up to 102.9.  Meeting SIRS criteria with tachycardia and fever.  WBC is 6.4.  Chest x-ray shows left lower lobe ill-defined opacity-atelectasis or pneumonia.  Not hypoxic.  Lung exam unremarkable. -Albuterol  nebs, mucolytics -Consulted with pharm, patient is not a candidate for remdesivir, paxlovid - no benefit in hospitalized patients  - Deferred steroids as she is not hypoxic. -Procalcitonin less than 0.1. -I talked to patient and her spouse about discharge from the ED, put patient spouse is not comfortable taking patient home, but patient was very sick this morning, patient has history of bad bronchitis. -3 L bolus given, cont N/s + 40kcl 100cc/hr x 10hrs  Hyponatremia Sodium 130.  Also with hypokalemia of 3.3.   Prediabetes A1c 6.2.  Hold  metformin .  Migraine Resume topiramate  for migraines, and phentermine  for weight loss  Morbid obesity (HCC) BMI of 44.   DVT prophylaxis: lovenox  Code Status: FULL Family Communication: none at bedside  Disposition Plan: ~ 1 day Consults called: None Admission status: Obs tele    Author: Tully FORBES Carwin, MD 11/22/2023 9:46 PM  For on call review [email or web address].

## 2023-11-22 NOTE — Assessment & Plan Note (Signed)
BMI of 44 

## 2023-11-22 NOTE — ED Provider Notes (Signed)
 [place] EMERGENCY DEPARTMENT AT [facility] Provider Note   CSN: [id number] Arrival date & time: 11/22/23  1529     Patient presents with: Shortness of Breath   Debra Parker is a 38 y.o. female.   Patient is a 38 year old female who presents emergency department the chief complaint of fever, chills, shortness of breath which began earlier this morning.  Patient notes that she did take a home COVID test which was positive.  Patient notes that she has had no abdominal pain, nausea, vomiting, diarrhea.  She denies any associated chest pain.  She does admit to an associated headache.  She denies any associated dizziness, lightheadedness or syncope.   Shortness of Breath Associated symptoms: fever        Prior to Admission medications   Medication Sig Start Date End Date Taking? Authorizing Provider  acetaminophen  (TYLENOL ) 500 MG tablet Take 500 mg by mouth every 6 (six) hours as needed for mild pain.    [provider]  albuterol  (VENTOLIN  HFA) 108 (90 Base) MCG/ACT inhaler Inhale 2 puffs into the lungs every 6 (six) hours as needed for wheezing or shortness of breath. 07/03/23   Gladis Elsie BROCKS, PA-C  cetirizine  (ZYRTEC  ALLERGY) 10 MG tablet Take 1 tablet (10 mg total) by mouth daily. Patient not taking: Reported on 09/30/2023 07/01/23   Stuart Vernell Norris, PA-C  cyanocobalamin  (VITAMIN B12) 1000 MCG/ML injection Inject 1 mL (1,000 mcg total) into the muscle every 30 (thirty) days. 10/23/22   Ozell Heron HERO, MD  fluticasone  (FLONASE ) 50 MCG/ACT nasal spray Place 1 spray into both nostrils 2 (two) times daily. Patient not taking: Reported on 09/30/2023 07/01/23   Stuart Vernell Norris, PA-C  furosemide  (LASIX ) 20 MG tablet Take 0.5 tablets (10 mg total) by mouth daily as needed. 10/01/22   Ozell Heron HERO, MD  guaiFENesin  (MUCINEX ) 600 MG 12 hr tablet Take 1 tablet (600 mg total) by mouth 2 (two) times daily. Patient not taking: Reported on  09/30/2023 07/01/23   Stuart Vernell Norris, PA-C  levonorgestrel  (MIRENA , 52 MG,) 20 MCG/24HR IUD 1 Intra Uterine Device (1 each total) by Intrauterine route once for 1 dose. 11/10/18 09/30/23  Koberlein, Junell C, MD  metFORMIN  (GLUCOPHAGE ) 500 MG tablet Take 1 tablet (500 mg total) by mouth daily with breakfast. 09/01/23 10/01/23  Francyne Romano, MD  methocarbamol  (ROBAXIN ) 500 MG tablet Take 1 tablet (500 mg total) by mouth 3 (three) times daily as needed. Patient not taking: Reported on 09/30/2023 07/09/23     metoCLOPramide  (REGLAN ) 10 MG tablet Take 1 tablet (10 mg total) by mouth every 6 (six) hours. Patient taking differently: Take 10 mg by mouth every 6 (six) hours as needed for vomiting or nausea. 04/21/23   Ozell Heron HERO, MD  ondansetron  (ZOFRAN -ODT) 4 MG disintegrating tablet Dissolve 1 tablet (4 mg total) in mouth every 8 (eight) hours as needed for nausea or vomiting. 05/26/22   Ozell Heron HERO, MD  ondansetron  (ZOFRAN -ODT) 4 MG disintegrating tablet Take 1 tablet (4 mg total) by mouth every 8 (eight) hours as needed. 10/16/23   Sharl Selinda Dover, MD  oxyCODONE  (ROXICODONE ) 5 MG immediate release tablet Take 1 tablet (5 mg total) by mouth every 4 (four) hours as needed for severe pain (pain score 7-10). 10/16/23   Sharl Selinda Dover, MD  pantoprazole  (PROTONIX ) 40 MG tablet Take 1 tablet (40 mg total) by mouth daily. Patient needs follow up appointment for future refills. Please call [phone] to schedule an  appointment. 08/19/23   Charlanne Groom, MD  phentermine  15 MG capsule Take 1 capsule (15 mg total) by mouth in the morning. 08/20/23   Berkeley Adelita PENNER, MD  promethazine -dextromethorphan  (PROMETHAZINE -DM) 6.25-15 MG/5ML syrup Take 5 mLs by mouth 4 (four) times daily as needed. 07/01/23   Stuart Vernell Norris, PA-C  topiramate  (TOPAMAX ) 50 MG tablet Take 3 tablets (150 mg total) by mouth 2 (two) times daily in the morning and at bedtime 08/20/23 11/19/23  Berkeley Adelita PENNER,  MD  Vitamin D , Ergocalciferol , (DRISDOL ) 1.25 MG (50000 UNIT) CAPS capsule Take 1 capsule (50,000 Units total) by mouth every 7 (seven) days. 08/18/23   Ozell Heron HERO, MD    Allergies: Aimovig  [erenumab -aooe] and Sulfa antibiotics    Review of Systems  Constitutional:  Positive for fever.  Respiratory:  Positive for shortness of breath.   All other systems reviewed and are negative.   Updated Vital Signs BP 125/83 (BP Location: Right Arm)   Pulse (!) 135   Temp (!) 102.9 F (39.4 C) (Oral)   Resp 18   Ht 5' 5 (1.651 m)   Wt 120.2 kg   SpO2 95%   BMI 44.10 kg/m   Physical Exam Vitals and nursing note reviewed.  Constitutional:      General: She is not in acute distress.    Appearance: Normal appearance. She is not ill-appearing.  HENT:     Head: Normocephalic and atraumatic.     Nose: Nose normal.     Mouth/Throat:     Mouth: Mucous membranes are moist.  Eyes:     Extraocular Movements: Extraocular movements intact.     Conjunctiva/sclera: Conjunctivae normal.     Pupils: Pupils are equal, round, and reactive to light.  Cardiovascular:     Rate and Rhythm: Normal rate and regular rhythm.     Pulses: Normal pulses.     Heart sounds: Normal heart sounds. No murmur heard. Pulmonary:     Effort: Pulmonary effort is normal. No tachypnea.     Breath sounds: Normal breath sounds. No decreased breath sounds, wheezing, rhonchi or rales.  Chest:     Chest wall: No tenderness.  Abdominal:     General: Abdomen is flat. Bowel sounds are normal.     Palpations: Abdomen is soft. There is no mass.     Tenderness: There is no abdominal tenderness. There is no guarding.  Musculoskeletal:        General: Normal range of motion.     Cervical back: Normal range of motion and neck supple.     Right lower leg: No edema.     Left lower leg: No edema.  Skin:    General: Skin is warm and dry.  Neurological:     General: No focal deficit present.     Mental Status: She is alert  and oriented to person, place, and time. Mental status is at baseline.     Cranial Nerves: No cranial nerve deficit.     Motor: No weakness.  Psychiatric:        Mood and Affect: Mood normal.        Behavior: Behavior normal.        Thought Content: Thought content normal.        Judgment: Judgment normal.     (all labs ordered are listed, but only abnormal results are displayed) Labs Reviewed  RESP PANEL BY RT-PCR (RSV, FLU A&B, COVID)  [id number]  CULTURE, BLOOD (ROUTINE X 2)  CULTURE, BLOOD (  ROUTINE X 2)  LACTIC ACID, PLASMA  LACTIC ACID, PLASMA  COMPREHENSIVE METABOLIC PANEL WITH GFR  CBC WITH DIFFERENTIAL/PLATELET  PROTIME-INR  URINALYSIS, W/ REFLEX TO CULTURE (INFECTION SUSPECTED)  HCG, SERUM, QUALITATIVE  POC URINE PREG, ED    EKG: None  Radiology: No results found.   Procedures   Medications Ordered in the ED  lactated ringers  infusion (has no administration in time range)  lactated ringers  bolus 1,000 mL (has no administration in time range)    And  lactated ringers  bolus 1,000 mL (has no administration in time range)    And  lactated ringers  bolus 1,000 mL (has no administration in time range)    And  lactated ringers  bolus 1,000 mL (has no administration in time range)  ketorolac  (TORADOL ) 15 MG/ML injection 15 mg (has no administration in time range)  acetaminophen  (TYLENOL ) tablet 1,000 mg (has no administration in time range)                                    Medical Decision Making Patient is doing well at this time and does remain stable.  Discussed with patient we will plan for admission to the hospital service.  She did meet sepsis criteria on initial presentation.  She has positive COVID-19 and checks x-ray is concerning for possible left lower lobe pneumonia.  Patient has had a difficult time controlling her fever at home and does have a history of previous prolonged hospitalization with COVID-19.  Blood work has otherwise been unremarkable at this  point.  She was treated with antibiotics, IV fluids and antiemetics.  She has had no associated hypoxia at this point.  Have discussed patient case with Dr. FORBES Carwin with the hospitalist service who has accepted patient for admission.  Amount and/or Complexity of Data Reviewed Labs: ordered. Radiology: ordered.  Risk OTC drugs. Prescription drug management. Decision regarding hospitalization.        Final diagnoses:  None    ED Discharge Orders     None          Debra Parker 11/22/23 2030    Suzette Pac, MD 11/25/23 1045

## 2023-11-22 NOTE — ED Notes (Signed)
 Pt ambulated to bathroom to void independently, tolerated well.

## 2023-11-22 NOTE — ED Triage Notes (Addendum)
 Pt to the ED after waking this morning with a fever. Pt took Tylenol  1000mg  at 11am and took ibuprofen  at 1430. Temperature continues to increase and is 102.9 in triage.  Pt is also tachycardic.  Pt also took a home covid test and it was positive.

## 2023-11-22 NOTE — Assessment & Plan Note (Addendum)
 Symptoms started today with dyspnea, cough and fever up to 102.9.  Meeting SIRS criteria with tachycardia and fever.  WBC is 6.4.  Chest x-ray shows left lower lobe ill-defined opacity-atelectasis or pneumonia.  Not hypoxic.  Lung exam unremarkable. -Albuterol  nebs, mucolytics -Consulted with pharm, patient is not a candidate for remdesivir, paxlovid - no benefit in hospitalized patients  - Deferred steroids as she is not hypoxic. -Procalcitonin less than 0.1. -I talked to patient and her spouse about discharge from the ED, put patient spouse is not comfortable taking patient home, but patient was very sick this morning, patient has history of bad bronchitis. -3 L bolus given, cont N/s + 40kcl 100cc/hr x 10hrs

## 2023-11-22 NOTE — Assessment & Plan Note (Signed)
 A1c 6.2.  Hold metformin .

## 2023-11-22 NOTE — Progress Notes (Signed)
 Elink following for sepsis protocol.

## 2023-11-22 NOTE — Assessment & Plan Note (Signed)
 Resume topiramate  for migraines, and phentermine  for weight loss

## 2023-11-22 NOTE — Assessment & Plan Note (Signed)
 Sodium 130.  Also with hypokalemia of 3.3.

## 2023-11-23 DIAGNOSIS — G4733 Obstructive sleep apnea (adult) (pediatric): Secondary | ICD-10-CM | POA: Diagnosis not present

## 2023-11-23 DIAGNOSIS — U071 COVID-19: Secondary | ICD-10-CM | POA: Diagnosis not present

## 2023-11-23 DIAGNOSIS — G43809 Other migraine, not intractable, without status migrainosus: Secondary | ICD-10-CM | POA: Diagnosis not present

## 2023-11-23 DIAGNOSIS — R112 Nausea with vomiting, unspecified: Secondary | ICD-10-CM

## 2023-11-23 DIAGNOSIS — R7303 Prediabetes: Secondary | ICD-10-CM | POA: Diagnosis not present

## 2023-11-23 LAB — CBC
HCT: 35 % — ABNORMAL LOW (ref 36.0–46.0)
Hemoglobin: 11.2 g/dL — ABNORMAL LOW (ref 12.0–15.0)
MCH: 28.5 pg (ref 26.0–34.0)
MCHC: 32 g/dL (ref 30.0–36.0)
MCV: 89.1 fL (ref 80.0–100.0)
Platelets: 211 K/uL (ref 150–400)
RBC: 3.93 MIL/uL (ref 3.87–5.11)
RDW: 12.6 % (ref 11.5–15.5)
WBC: 5.3 K/uL (ref 4.0–10.5)
nRBC: 0 % (ref 0.0–0.2)

## 2023-11-23 LAB — BASIC METABOLIC PANEL WITH GFR
Anion gap: 5 (ref 5–15)
BUN: 9 mg/dL (ref 6–20)
CO2: 22 mmol/L (ref 22–32)
Calcium: 8.1 mg/dL — ABNORMAL LOW (ref 8.9–10.3)
Chloride: 108 mmol/L (ref 98–111)
Creatinine, Ser: 0.71 mg/dL (ref 0.44–1.00)
GFR, Estimated: >60 mL/min (ref >60)
Glucose, Bld: 106 mg/dL — ABNORMAL HIGH (ref 70–99)
Potassium: 4.5 mmol/L (ref 3.5–5.1)
Sodium: 135 mmol/L (ref 135–145)

## 2023-11-23 LAB — HIV ANTIBODY (ROUTINE TESTING W REFLEX): HIV Screen 4th Generation wRfx: NONREACTIVE

## 2023-11-23 MED ORDER — GUAIFENESIN-DM 100-10 MG/5ML PO SYRP: 15.0000 mL | ORAL_SOLUTION | Freq: Three times a day (TID) | ORAL | 0 refills | Status: DC

## 2023-11-23 MED ORDER — METOCLOPRAMIDE HCL 10 MG PO TABS: 10.0000 mg | ORAL_TABLET | Freq: Four times a day (QID) | ORAL | Status: DC | PRN

## 2023-11-23 NOTE — ED Notes (Signed)
 Pt ambulated to the restroom.

## 2023-11-23 NOTE — Discharge Summary (Signed)
 Physician Discharge Summary   Patient: Debra Parker MRN: [id number] DOB: 02-17-1986  Admit date:     11/22/2023  Discharge date: 11/23/23  Discharge Physician: Eric Nunnery   PCP: Ozell Heron HERO, MD   Recommendations at discharge:  Repeat basic metabolic panel to follow electrolytes and renal function Continue assisting patient with weight loss management Repeat x-rays in 6 weeks to assure resolution of atelectatic changes/presumed infiltrate.   Discharge Diagnoses: Principal Problem:   COVID-19 virus infection Active Problems:   Hyponatremia   Morbid obesity (HCC)   Migraine   Prediabetes   OSA (obstructive sleep apnea)   Nausea and vomiting  Brief Hospital admission narrative: As per H&P written by Dr. Pearlean on 11/22/2023 Debra Parker is a 38 y.o. female with medical history significant for OSA, migraines. Patient presented to the ED with complaints of fever, cough, difficulty breathing prior to arrival to the ED.  She reports fever of up to 102.9 at home, reports she took Tylenol  and then ibuprofen  for fever persisted.  She reports symptoms started today.  No vomiting no diarrhea. She reports prior history of COVID infection- she was sick for about 21 days but did not require hospitalization.   ED Course: Tmax 102.9.  Heart rate initially 135 improved to 83.  Respiratory rate 14-18.  Blood pressure systolic 102-125.  O2 sats greater than 91% on room air. COVID test positive. Sodium 130, potassium 3.3. Chest x-ray showing left lower lung opacity-atelectasis versus pneumonia. 3 L bolus given, IV ceftriaxone  and azithromycin  started. Hospitalist admit for COVID infection, pneumonia.  Assessment and Plan: * COVID-19 virus infection -Symptoms started to get worse today with dyspnea, cough and fever up to 102.9.  - Per discussion with patient's overall symptoms and general malaise has been present for the last 3 days total. - Patient transiently met SIRS  criteria with tachycardia and fever.  All of them resolved with supportive care and fluid resuscitation. - She had WBC of 6.4; was not requiring oxygen supplementation and her chest x-ray demonstrated most likely atelectatic changes. - Procalcitonin less than 0.1. - After discussing with pharmacist not a candidate for remdesivir, steroids or Paxlovid  - Discussed with patient supportive care treatment, Tylenol /ibuprofen  as antipyretics and comfort medications drugs. - Patient advised to keep herself well-hydrated and to follow-up with PCP in 2 weeks. - We discussed safety features like the use of mask, hand hygiene and keeping herself 6 feet apart to minimize further spread of infection  Hyponatremia/hypokalemia - Electrolytes repleted and within normal limits at discharge - Repeat basic metabolic panel a follow-up visit to assess stability.  History of prediabetes -A1c 6.2.   - Resume home oral hypoglycemic agents and continue to follow CBG fluctuation - Follow-up with PCP.  Migraine -Continue home medication regimen. - No complaints of headaches reported.  Morbid obesity (HCC) -BMI of 44. - Low-calorie diet, portion control and increase physical activity discussed with patient.  Consultants: None Procedures performed: See below for x-ray report. Disposition: Home Diet recommendation: Low calorie and modified carbohydrate diet.  DISCHARGE MEDICATION: Allergies as of 11/23/2023       Reactions   Aimovig  [erenumab -aooe] Hives, Rash   Sulfa Antibiotics Rash        Medication List     STOP taking these medications    Mirena  (52 MG) 20 MCG/24HR Iud Generic drug: levonorgestrel    oxyCODONE  5 MG immediate release tablet Commonly known as: Roxicodone    topiramate  50 MG tablet Commonly known as: TOPAMAX   TAKE these medications    acetaminophen  500 MG tablet Commonly known as: TYLENOL  Take 500 mg by mouth every 6 (six) hours as needed for mild pain.    albuterol  108 (90 Base) MCG/ACT inhaler Commonly known as: VENTOLIN  HFA Inhale 2 puffs into the lungs every 6 (six) hours as needed for wheezing or shortness of breath.   cyanocobalamin  1000 MCG/ML injection Commonly known as: VITAMIN B12 Inject 1 mL (1,000 mcg total) into the muscle every 30 (thirty) days.   furosemide  20 MG tablet Commonly known as: Lasix  Take 0.5 tablets (10 mg total) by mouth daily as needed.   guaiFENesin -dextromethorphan  100-10 MG/5ML syrup Commonly known as: ROBITUSSIN DM Take 15 mLs by mouth every 8 (eight) hours.   metFORMIN  500 MG tablet Commonly known as: GLUCOPHAGE  Take 1 tablet (500 mg total) by mouth daily with breakfast.   methocarbamol  500 MG tablet Commonly known as: ROBAXIN  Take 1 tablet (500 mg total) by mouth 3 (three) times daily as needed.   metoCLOPramide  10 MG tablet Commonly known as: REGLAN  Take 1 tablet (10 mg total) by mouth every 6 (six) hours as needed for vomiting or nausea.   ondansetron  4 MG disintegrating tablet Commonly known as: ZOFRAN -ODT Take 1 tablet (4 mg total) by mouth every 8 (eight) hours as needed.   pantoprazole  40 MG tablet Commonly known as: PROTONIX  Take 1 tablet (40 mg total) by mouth daily. Patient needs follow up appointment for future refills. Please call [phone] to schedule an appointment.   phentermine  15 MG capsule Take 1 capsule (15 mg total) by mouth in the morning.   Vitamin D  (Ergocalciferol ) 1.25 MG (50000 UNIT) Caps capsule Commonly known as: DRISDOL  Take 1 capsule (50,000 Units total) by mouth every 7 (seven) days. What changed: when to take this        Follow-up Information     Ozell Heron HERO, MD. Schedule an appointment as soon as possible for a visit in 2 week(s).   Specialty: Family Medicine Contact information: [address] [place] [address] [phone]                Discharge Exam: Debra Parker   11/22/23 1540  Weight: 120.2 kg    General exam: Alert, awake, oriented x 3; afebrile and in no acute distress. Respiratory system: Good air movement bilaterally; no wheezing, no crackles, no using accessory muscles.  Good saturation on room air appreciated. Cardiovascular system:RRR. No murmurs, rubs, gallops. Gastrointestinal system: Abdomen is obese, nondistended, soft and nontender. No organomegaly or masses felt. Normal bowel sounds heard. Central nervous system: No focal neurological deficits. Extremities: No C/C/E, +pedal pulses Skin: No rashes, lesions or ulcers Psychiatry: Judgement and insight appear normal. Mood & affect appropriate.    Condition at discharge: Stable and improved.  The results of significant diagnostics from this hospitalization (including imaging, microbiology, ancillary and laboratory) are listed below for reference.   Imaging Studies: DG Chest Port 1 View Result Date: 11/22/2023 CLINICAL DATA:  Questionable sepsis - evaluate for abnormality Fever. EXAM: PORTABLE CHEST 1 VIEW COMPARISON:  12/10/2021 FINDINGS: Ill-defined left lower lobe opacity. The right lung is clear. The heart is normal in size for technique. Stable mediastinal contours. No pulmonary edema, pleural effusion, or pneumothorax. No acute osseous findings. IMPRESSION: Ill-defined left lower lobe opacity, atelectasis versus pneumonia. Electronically Signed   By: Andrea Gasman M.D.   On: 11/22/2023 16:40    Microbiology: Results for orders placed or performed during the hospital encounter of 11/22/23  Resp panel by RT-PCR (RSV, Flu A&B, Covid) Anterior Nasal Swab     Status: Abnormal   Collection Time: 11/22/23  3:55 PM   Specimen: Anterior Nasal Swab  Result Value Ref Range Status   SARS Coronavirus 2 by RT PCR POSITIVE (A) NEGATIVE Final    Comment: (NOTE) SARS-CoV-2 target nucleic acids are DETECTED.  The SARS-CoV-2 RNA is generally detectable in upper respiratory specimens during the acute phase of infection. Positive  results are indicative of the presence of the identified virus, but do not rule out bacterial infection or co-infection with other pathogens not detected by the test. Clinical correlation with patient history and other diagnostic information is necessary to determine patient infection status. The expected result is Negative.  Fact Sheet for Patients: BloggerCourse.com  Fact Sheet for Healthcare Providers: SeriousBroker.it  This test is not yet approved or cleared by the [place]  FDA and  has been authorized for detection and/or diagnosis of SARS-CoV-2 by FDA under an Emergency Use Authorization (EUA).  This EUA will remain in effect (meaning this test can be used) for the duration of  the COVID-19 declaration under Section 564(b)(1) of the A ct, 21 U.S.C. section 360bbb-3(b)(1), unless the authorization is terminated or revoked sooner.     Influenza A by PCR NEGATIVE NEGATIVE Final   Influenza B by PCR NEGATIVE NEGATIVE Final    Comment: (NOTE) The Xpert Xpress SARS-CoV-2/FLU/RSV plus assay is intended as an aid in the diagnosis of influenza from Nasopharyngeal swab specimens and should not be used as a sole basis for treatment. Nasal washings and aspirates are unacceptable for Xpert Xpress SARS-CoV-2/FLU/RSV testing.  Fact Sheet for Patients: BloggerCourse.com  Fact Sheet for Healthcare Providers: SeriousBroker.it  This test is not yet approved or cleared by the [place]  FDA and has been authorized for detection and/or diagnosis of SARS-CoV-2 by FDA under an Emergency Use Authorization (EUA). This EUA will remain in effect (meaning this test can be used) for the duration of the COVID-19 declaration under Section 564(b)(1) of the Act, 21 U.S.C. section 360bbb-3(b)(1), unless the authorization is terminated or revoked.     Resp Syncytial Virus by PCR NEGATIVE  NEGATIVE Final    Comment: (NOTE) Fact Sheet for Patients: BloggerCourse.com  Fact Sheet for Healthcare Providers: SeriousBroker.it  This test is not yet approved or cleared by the [place]  FDA and has been authorized for detection and/or diagnosis of SARS-CoV-2 by FDA under an Emergency Use Authorization (EUA). This EUA will remain in effect (meaning this test can be used) for the duration of the COVID-19 declaration under Section 564(b)(1) of the Act, 21 U.S.C. section 360bbb-3(b)(1), unless the authorization is terminated or revoked.  Performed at [facility], [address] [place], [place] [address]   Blood Culture (routine x 2)     Status: None (Preliminary result)   Collection Time: 11/22/23  4:20 PM   Specimen: BLOOD  Result Value Ref Range Status   Specimen Description BLOOD RIGHT ANTECUBITAL  Final   Special Requests   Final    BOTTLES DRAWN AEROBIC AND ANAEROBIC Blood Culture adequate volume   Culture   Final    NO GROWTH < 24 HOURS Performed at [facility], [address], [place], [place] [address]    Report Status PENDING  Incomplete  Blood Culture (routine x 2)     Status: None (Preliminary result)   Collection Time: 11/22/23  4:20 PM   Specimen: BLOOD  Result Value Ref Range Status   Specimen  Description BLOOD BLOOD RIGHT ARM  Final   Special Requests   Final    BOTTLES DRAWN AEROBIC AND ANAEROBIC Blood Culture adequate volume   Culture   Final    NO GROWTH < 24 HOURS Performed at [facility], [address], [place], [place] [address]    Report Status PENDING  Incomplete    Labs: CBC: Recent Labs  Lab 11/22/23 1620 11/23/23 0458  WBC 6.4 5.3  NEUTROABS 5.4  --   HGB 12.8 11.2*  HCT 37.8 35.0*  MCV 86.1 89.1  PLT 247 211   Basic Metabolic Panel: Recent Labs  Lab 11/22/23 1620 11/23/23 0458  NA 130* 135  K 3.3* 4.5  CL 104 108  CO2 17* 22  GLUCOSE 102* 106*  BUN 11 9  CREATININE  0.70 0.71  CALCIUM 8.3* 8.1*   Liver Function Tests: Recent Labs  Lab 11/22/23 1620  AST 16  ALT 18  ALKPHOS 66  BILITOT 0.2  PROT 7.4  ALBUMIN 3.6   CBG: No results for input(s): GLUCAP in the last 168 hours.  Discharge time spent:  35 minutes.  Signed: Eric Nunnery, MD [facility] 11/23/2023

## 2023-11-24 ENCOUNTER — Telehealth: Payer: Self-pay

## 2023-11-24 ENCOUNTER — Encounter: Payer: Self-pay | Admitting: Family Medicine

## 2023-11-24 NOTE — Transitions of Care (Post Inpatient/ED Visit) (Signed)
 11/24/2023  Name: Debra Parker MRN: [id number] DOB: 1985-11-01  Today's TOC FU Call Status: Today's TOC FU Call Status:: Successful TOC FU Call Completed TOC FU Call Complete Date: 11/24/23 Patient's Name and Date of Birth confirmed.  Transition Care Management Follow-up Telephone Call Date of Discharge: 11/23/23 [facility] Type of Discharge: Inpatient Admission Primary Inpatient Discharge Diagnosis:: pnemonia How have you been since you were released from the hospital?: Better Any questions or concerns?: No  Items Reviewed: Did you receive and understand the discharge instructions provided?: Yes Medications obtained,verified, and reconciled?: Yes (Medications Reviewed) Any new allergies since your discharge?: No Dietary orders reviewed?: Yes Do you have support at home?: Yes People in Home [RPT]: spouse  Medications Reviewed Today: Medications Reviewed Today     Reviewed by Emmitt Pan, LPN (Licensed Practical Nurse) on 11/24/23 at 1514  Med List Status: <None>   Medication Order Taking? Sig Documenting Provider Last Dose Status Informant  acetaminophen  (TYLENOL ) 500 MG tablet [id number] Yes Take 500 mg by mouth every 6 (six) hours as needed for mild pain. [provider]  Active Self, Pharmacy Records  albuterol  (VENTOLIN  HFA) 108 (90 Base) MCG/ACT inhaler [id number] Yes Inhale 2 puffs into the lungs every 6 (six) hours as needed for wheezing or shortness of breath. Gladis Elsie BROCKS, PA-C  Active Self, Pharmacy Records  cyanocobalamin  (VITAMIN B12) 1000 MCG/ML injection [id number] Yes Inject 1 mL (1,000 mcg total) into the muscle every 30 (thirty) days. Ozell Heron HERO, MD  Active Self, Pharmacy Records  furosemide  (LASIX ) 20 MG tablet [id number] Yes Take 0.5 tablets (10 mg total) by mouth daily as needed. Ozell Heron HERO, MD  Active Self, Pharmacy Records  guaiFENesin -dextromethorphan  [facility] DM) 100-10 MG/5ML syrup  [id number] Yes Take 15 mLs by mouth every 8 (eight) hours. Ricky Fines, MD  Active   metFORMIN  (GLUCOPHAGE ) 500 MG tablet [id number] Yes Take 1 tablet (500 mg total) by mouth daily with breakfast. Francyne Romano, MD  Active Self, Pharmacy Records  methocarbamol  (ROBAXIN ) 500 MG tablet [id number]  Take 1 tablet (500 mg total) by mouth 3 (three) times daily as needed.  Patient not taking: Reported on 11/24/2023     Active Self, Pharmacy Records  metoCLOPramide  (REGLAN ) 10 MG tablet [id number] Yes Take 1 tablet (10 mg total) by mouth every 6 (six) hours as needed for vomiting or nausea. Ricky Fines, MD  Active   ondansetron  (ZOFRAN -ODT) 4 MG disintegrating tablet [id number] Yes Take 1 tablet (4 mg total) by mouth every 8 (eight) hours as needed. Sharl Selinda Dover, MD  Active Self, Pharmacy Records  pantoprazole  (PROTONIX ) 40 MG tablet [id number] Yes Take 1 tablet (40 mg total) by mouth daily. Patient needs follow up appointment for future refills. Please call [phone] to schedule an appointment. Charlanne Groom, MD  Active Self, Pharmacy Records  phentermine  15 MG capsule [id number] Yes Take 1 capsule (15 mg total) by mouth in the morning. [place] Adelita PENNER, MD  Active Self, Pharmacy Records           Med Note LEOBARDO, SCHUYLER GORMAN Repress Nov 22, 2023  6:17 PM)    Vitamin D , Ergocalciferol , (DRISDOL ) 1.25 MG (50000 UNIT) CAPS capsule [id number] Yes Take 1 capsule (50,000 Units total) by mouth every 7 (seven) days.  Patient taking differently: Take 50,000 Units by mouth every Monday.   Ozell Heron HERO, MD  Active Self, Pharmacy Records            Home Care and  Equipment/Supplies: Were Home Health Services Ordered?: NA Any new equipment or medical supplies ordered?: NA  Functional Questionnaire: Do you need assistance with bathing/showering or dressing?: No Do you need assistance with meal preparation?: No Do you need assistance with eating?: No Do you have difficulty maintaining  continence: No Do you need assistance with getting out of bed/getting out of a chair/moving?: No Do you have difficulty managing or taking your medications?: No  Follow up appointments reviewed: PCP Follow-up appointment confirmed?: Yes Date of PCP follow-up appointment?: 12/01/23 Follow-up Provider: [facility] Follow-up appointment confirmed?: NA Do you need transportation to your follow-up appointment?: No Do you understand care options if your condition(s) worsen?: Yes-patient verbalized understanding    SIGNATURE Julian Lemmings, LPN [facility] Nurse Health Advisor Direct Dial [phone]

## 2023-11-26 ENCOUNTER — Encounter: Payer: Commercial Managed Care - PPO | Admitting: Family Medicine

## 2023-11-27 DIAGNOSIS — G4733 Obstructive sleep apnea (adult) (pediatric): Secondary | ICD-10-CM | POA: Diagnosis not present

## 2023-11-27 LAB — CULTURE, BLOOD (ROUTINE X 2)
Culture: NO GROWTH
Culture: NO GROWTH
Special Requests: ADEQUATE
Special Requests: ADEQUATE

## 2023-12-01 ENCOUNTER — Encounter: Payer: Self-pay | Admitting: Family Medicine

## 2023-12-01 ENCOUNTER — Ambulatory Visit (INDEPENDENT_AMBULATORY_CARE_PROVIDER_SITE_OTHER): Admitting: Family Medicine

## 2023-12-01 ENCOUNTER — Other Ambulatory Visit (HOSPITAL_COMMUNITY): Payer: Self-pay

## 2023-12-01 VITALS — BP 118/80 | HR 77 | Temp 98.3°F | Ht 65.0 in | Wt 266.6 lb

## 2023-12-01 DIAGNOSIS — U071 COVID-19: Secondary | ICD-10-CM | POA: Diagnosis not present

## 2023-12-01 DIAGNOSIS — J129 Viral pneumonia, unspecified: Secondary | ICD-10-CM

## 2023-12-01 MED ORDER — BUDESONIDE-FORMOTEROL FUMARATE 160-4.5 MCG/ACT IN AERO
2.0000 | INHALATION_SPRAY | Freq: Two times a day (BID) | RESPIRATORY_TRACT | 3 refills | Status: AC
  Filled 2023-12-01 – 2023-12-11 (×2): qty 10.2, 30d supply, fill #0
  Filled 2024-04-11: qty 10.2, 30d supply, fill #1

## 2023-12-01 MED ORDER — MELOXICAM 7.5 MG PO TABS
7.5000 mg | ORAL_TABLET | Freq: Two times a day (BID) | ORAL | 0 refills | Status: AC
Start: 2023-12-01 — End: ?
  Filled 2023-12-01 – 2023-12-11 (×2): qty 30, 15d supply, fill #0

## 2023-12-01 NOTE — Progress Notes (Unsigned)
 Established Patient Office Visit  Subjective   Patient ID: Debra Parker, female    DOB: Aug 15, 1985  Age: 38 y.o. MRN: [id number]  Chief Complaint  Patient presents with   Hospitalization Follow-up    Pt is here for hospital follow up. States she was diagnosed with COVID and sepsis due to LLL pneumonia. She was given IV abx over night and she stayed in the ER overnight. Pt was released the next day from the ER, states that since she has been home she has felt extremely weak and tired, states that this is the first time she has been out of the house, still having coughing, mucus, no fever or chills. No pain with inspiration. Feels a bit wheezy but otherwise no pain.  States she still feel some ear pain in the left, comes and goes. Also still having some sinus congestion.     Current Outpatient Medications  Medication Instructions   acetaminophen  (TYLENOL ) 500 mg, Every 6 hours PRN   albuterol  (VENTOLIN  HFA) 108 (90 Base) MCG/ACT inhaler 2 puffs, Inhalation, Every 6 hours PRN   budesonide -formoterol  (SYMBICORT ) 160-4.5 MCG/ACT inhaler 2 puffs, Inhalation, 2 times daily   cyanocobalamin  (VITAMIN B12) 1,000 mcg, Intramuscular, Every 30 days   furosemide  (LASIX ) 10 mg, Oral, Daily PRN   guaiFENesin -dextromethorphan  (ROBITUSSIN DM) 100-10 MG/5ML syrup 15 mLs, Oral, Every 8 hours   levonorgestrel  (MIRENA ) 20 MCG/DAY IUD 1 each,  Once   meloxicam  (MOBIC ) 7.5 MG tablet Take 1 tablet (7.5 mg total) by mouth 2 (two) times daily for shoulder pain.   metFORMIN  (GLUCOPHAGE ) 500 mg, Oral, Daily with breakfast   methocarbamol  (ROBAXIN ) 500 mg, Oral, 3 times daily PRN   metoCLOPramide  (REGLAN ) 10 mg, Oral, Every 6 hours PRN   ondansetron  (ZOFRAN -ODT) 4 mg, Oral, Every 8 hours PRN   pantoprazole  (PROTONIX ) 40 mg, Oral, Daily, Patient needs follow up appointment for future refills. Please call [phone] to schedule an appointment.   phentermine  15 mg, Oral, Every morning   Vitamin D   (Ergocalciferol ) (DRISDOL ) 50,000 Units, Oral, Every 7 days    Patient Active Problem List   Diagnosis Date Noted   Nausea and vomiting 11/23/2023   COVID-19 virus infection 11/22/2023   Hyponatremia 11/22/2023   Acute shoulder pain 08/31/2023   BMI 45.0-49.9, adult (HCC) 07/08/2023   SOBOE (shortness of breath on exertion) 06/01/2023   Dyslipidemia (high LDL; low HDL) 03/28/2023   Other specified eating disorder 03/18/2023   Chronic bronchitis (HCC) 10/01/2022   Vitamin D  deficiency 12/31/2021   Prediabetes 12/25/2021   OSA (obstructive sleep apnea) 12/25/2021   Lichen planopilaris 02/19/2020   Migraine 02/17/2020   Morbid obesity (HCC) 02/01/2018      Review of Systems  Constitutional:  Positive for malaise/fatigue. Negative for chills and fever.  Respiratory:  Positive for cough, sputum production, shortness of breath and wheezing. Negative for hemoptysis.   All other systems reviewed and are negative.     Objective:     BP 118/80   Pulse 77   Temp 98.3 F (36.8 C) (Oral)   Ht 5' 5 (1.651 m)   Wt 266 lb 9.6 oz (120.9 kg)   SpO2 97%   BMI 44.36 kg/m     Physical Exam Vitals reviewed.  Constitutional:      Appearance: Normal appearance. She is morbidly obese.  Neurological:     Mental Status: She is alert.      No results found for any visits on 12/01/23.     The  10-year ASCVD risk score (Arnett DK, et al., 2019) is: 0.8%    Assessment & Plan:  COVID-19 virus infection -     DG Chest 2 View; Future  Viral pneumonia, unspecified -     DG Chest 2 View; Future -     Budesonide -Formoterol  Fumarate; Inhale 2 puffs into the lungs 2 (two) times daily.  Dispense: 10.2 g; Refill: 3     No follow-ups on file.    Heron CHRISTELLA Sharper, MD

## 2023-12-02 ENCOUNTER — Encounter: Admitting: Family Medicine

## 2023-12-10 ENCOUNTER — Other Ambulatory Visit (HOSPITAL_COMMUNITY): Payer: Self-pay

## 2023-12-11 ENCOUNTER — Ambulatory Visit (INDEPENDENT_AMBULATORY_CARE_PROVIDER_SITE_OTHER): Admitting: Family Medicine

## 2023-12-11 ENCOUNTER — Other Ambulatory Visit (HOSPITAL_COMMUNITY): Payer: Self-pay

## 2023-12-11 VITALS — BP 110/74 | HR 65 | Temp 98.0°F | Ht 64.0 in | Wt 269.8 lb

## 2023-12-11 DIAGNOSIS — Z23 Encounter for immunization: Secondary | ICD-10-CM | POA: Diagnosis not present

## 2023-12-11 DIAGNOSIS — E559 Vitamin D deficiency, unspecified: Secondary | ICD-10-CM

## 2023-12-11 DIAGNOSIS — E785 Hyperlipidemia, unspecified: Secondary | ICD-10-CM

## 2023-12-11 DIAGNOSIS — Z0000 Encounter for general adult medical examination without abnormal findings: Secondary | ICD-10-CM | POA: Diagnosis not present

## 2023-12-11 DIAGNOSIS — R7303 Prediabetes: Secondary | ICD-10-CM

## 2023-12-11 LAB — LIPID PANEL
Cholesterol: 166 mg/dL (ref 0–200)
HDL: 50.1 mg/dL (ref >39.00)
LDL Cholesterol: 102 mg/dL — ABNORMAL HIGH (ref 0–99)
NonHDL: 115.78
Total CHOL/HDL Ratio: 3
Triglycerides: 69 mg/dL (ref 0.0–149.0)
VLDL: 13.8 mg/dL (ref 0.0–40.0)

## 2023-12-11 LAB — COMPREHENSIVE METABOLIC PANEL WITH GFR
ALT: 18 U/L (ref 0–35)
AST: 15 U/L (ref 0–37)
Albumin: 4.1 g/dL (ref 3.5–5.2)
Alkaline Phosphatase: 59 U/L (ref 39–117)
BUN: 14 mg/dL (ref 6–23)
CO2: 22 meq/L (ref 19–32)
Calcium: 8.3 mg/dL — ABNORMAL LOW (ref 8.4–10.5)
Chloride: 106 meq/L (ref 96–112)
Creatinine, Ser: 0.7 mg/dL (ref 0.40–1.20)
GFR: 108.1 mL/min (ref >60.00)
Glucose, Bld: 89 mg/dL (ref 70–99)
Potassium: 3.9 meq/L (ref 3.5–5.1)
Sodium: 138 meq/L (ref 135–145)
Total Bilirubin: 0.4 mg/dL (ref 0.2–1.2)
Total Protein: 7.2 g/dL (ref 6.0–8.3)

## 2023-12-11 LAB — VITAMIN D 25 HYDROXY (VIT D DEFICIENCY, FRACTURES): VITD: 15.71 ng/mL — ABNORMAL LOW (ref 30.00–100.00)

## 2023-12-11 LAB — HEMOGLOBIN A1C: Hgb A1c MFr Bld: 6.5 % (ref 4.6–6.5)

## 2023-12-11 NOTE — Patient Instructions (Signed)

## 2023-12-11 NOTE — Progress Notes (Signed)
 Complete physical exam  Patient: Debra Parker   DOB: [birth year]   38 y.o. Female  MRN: [id number]  Subjective:    Chief Complaint  Patient presents with   Annual Exam    Debra Parker is a 38 y.o. female who presents today for a complete physical exam. She reports consuming a general diet, is going to the weight loss center and is on a specific diet. Has cut out fast food, no ultraprocessed foods, no sugary beverages. Exercise is limited by orthopedic condition(s): had shoulder  surgery recently, going to PT twice a week. She generally feels well. She reports sleeping fairly well, states that she really cannot tolerate her CPAP machine, feels like it is suffocating her, has tried multiple times. She does not have additional problems to discuss today.    PreDM-- patient tried taking the metformin  however it caused severe nausea, could not tolerate the medication so it was discontinued.   Most recent fall risk assessment:     No data to display           Most recent depression screenings:    11/25/2022    8:01 AM 10/01/2022    8:10 AM  PHQ 2/9 Scores  PHQ - 2 Score 0 0  PHQ- 9 Score 0     Vision:Within last year and Dental: No current dental problems and Receives regular dental care  Patient Active Problem List   Diagnosis Date Noted   Nausea and vomiting 11/23/2023   COVID-19 virus infection 11/22/2023   Hyponatremia 11/22/2023   Acute shoulder pain 08/31/2023   BMI 45.0-49.9, adult (HCC) 07/08/2023   SOBOE (shortness of breath on exertion) 06/01/2023   Dyslipidemia (high LDL; low HDL) 03/28/2023   Other specified eating disorder 03/18/2023   Chronic bronchitis (HCC) 10/01/2022   Vitamin D  deficiency 12/31/2021   Prediabetes 12/25/2021   OSA (obstructive sleep apnea) 12/25/2021   Lichen planopilaris 02/19/2020   Migraine 02/17/2020   Morbid obesity (HCC) 02/01/2018      Patient Care Team: Ozell Heron HERO, MD as PCP - General (Family  Medicine) Ob/Gyn, [facility] (Obstetrics and Gynecology) Dermatology, [facility]   Outpatient Medications Prior to Visit  Medication Sig Note   acetaminophen  (TYLENOL ) 500 MG tablet Take 500 mg by mouth every 6 (six) hours as needed for mild pain.    albuterol  (VENTOLIN  HFA) 108 (90 Base) MCG/ACT inhaler Inhale 2 puffs into the lungs every 6 (six) hours as needed for wheezing or shortness of breath.    budesonide -formoterol  (SYMBICORT ) 160-4.5 MCG/ACT inhaler Inhale 2 puffs into the lungs 2 (two) times daily.    cyanocobalamin  (VITAMIN B12) 1000 MCG/ML injection Inject 1 mL (1,000 mcg total) into the muscle every 30 (thirty) days.    furosemide  (LASIX ) 20 MG tablet Take 0.5 tablets (10 mg total) by mouth daily as needed.    guaiFENesin -dextromethorphan  (ROBITUSSIN DM) 100-10 MG/5ML syrup Take 15 mLs by mouth every 8 (eight) hours.    levonorgestrel  (MIRENA ) 20 MCG/DAY IUD 1 each by Intrauterine route once.    meloxicam  (MOBIC ) 7.5 MG tablet Take 1 tablet (7.5 mg total) by mouth 2 (two) times daily for shoulder pain.    methocarbamol  (ROBAXIN ) 500 MG tablet Take 1 tablet (500 mg total) by mouth 3 (three) times daily as needed.    metoCLOPramide  (REGLAN ) 10 MG tablet Take 1 tablet (10 mg total) by mouth every 6 (six) hours as needed for vomiting or nausea.    ondansetron  (ZOFRAN -ODT) 4 MG disintegrating tablet  Take 1 tablet (4 mg total) by mouth every 8 (eight) hours as needed.    pantoprazole  (PROTONIX ) 40 MG tablet Take 1 tablet (40 mg total) by mouth daily. Patient needs follow up appointment for future refills. Please call [phone] to schedule an appointment.    phentermine  15 MG capsule Take 1 capsule (15 mg total) by mouth in the morning.    Vitamin D , Ergocalciferol , (DRISDOL ) 1.25 MG (50000 UNIT) CAPS capsule Take 1 capsule (50,000 Units total) by mouth every 7 (seven) days. (Patient taking differently: Take 50,000 Units by mouth every Monday.)    [DISCONTINUED] metFORMIN   (GLUCOPHAGE ) 500 MG tablet Take 1 tablet (500 mg total) by mouth daily with breakfast. 12/11/2023: nausea   No facility-administered medications prior to visit.    Review of Systems  HENT:  Negative for hearing loss.   Eyes:  Negative for blurred vision.  Respiratory:  Negative for shortness of breath.   Cardiovascular:  Negative for chest pain.  Gastrointestinal: Negative.   Genitourinary: Negative.   Musculoskeletal:  Negative for back pain.  Neurological:  Negative for headaches.  Psychiatric/Behavioral:  Negative for depression.        Objective:     BP 110/74   Pulse 65   Temp 98 F (36.7 C) (Oral)   Ht 5' 4 (1.626 m)   Wt 269 lb 12.8 oz (122.4 kg)   SpO2 100%   BMI 46.31 kg/m    Physical Exam Vitals reviewed.  Constitutional:      Appearance: Normal appearance. She is well-groomed. She is morbidly obese.  HENT:     Right Ear: Tympanic membrane and ear canal normal.     Left Ear: Tympanic membrane and ear canal normal.     Mouth/Throat:     Mouth: Mucous membranes are moist.     Pharynx: No posterior oropharyngeal erythema.  Eyes:     Conjunctiva/sclera: Conjunctivae normal.  Neck:     Thyroid : No thyromegaly.  Cardiovascular:     Rate and Rhythm: Normal rate and regular rhythm.     Pulses: Normal pulses.     Heart sounds: S1 normal and S2 normal.  Pulmonary:     Effort: Pulmonary effort is normal.     Breath sounds: Normal breath sounds and air entry.  Abdominal:     General: Abdomen is flat. Bowel sounds are normal.     Palpations: Abdomen is soft.  Musculoskeletal:     Right lower leg: No edema.     Left lower leg: No edema.  Lymphadenopathy:     Cervical: No cervical adenopathy.  Neurological:     Mental Status: She is alert and oriented to person, place, and time. Mental status is at baseline.     Gait: Gait is intact.  Psychiatric:        Mood and Affect: Mood and affect normal.        Speech: Speech normal.        Behavior: Behavior  normal.        Judgment: Judgment normal.      No results found for any visits on 12/11/23.     Assessment & Plan:    Routine Health Maintenance and Physical Exam  Immunization History  Administered Date(s) Administered   DTP 10/16/1988   Dtap, Unspecified 08/08/1983, 09/12/1983, 11/02/1983, 01/15/1985   Fluzone Influenza virus vaccine,trivalent (IIV3), split virus 03/03/2006   HIB, Unspecified 06/30/1985   Hep B, Unspecified 12/19/1994, 01/23/1995, 07/17/1995   Influenza,inj,Quad PF,6+ Mos 01/25/2014  Influenza-Unspecified 01/06/2016, 12/30/2018, 01/17/2020, 01/10/2021   MMR 09/11/1984   Moderna Sars-Covid-2 Vaccination 06/11/2019, 07/13/2019   OPV 10/16/1988   Polio, Unspecified 08/08/1983, 10/07/1983, 12/01/1983, 01/15/1985   Tdap 08/23/2010   Unspecified SARS-COV-2 Vaccination 07/07/2019, 08/06/2019    Health Maintenance  Topic Date Due   Pneumococcal Vaccine (1 of 2 - PCV) Never done   HPV VACCINES (1 - Risk 3-dose SCDM series) Never done   DTaP/Tdap/Td (7 - Td or Tdap) 08/22/2020   Cervical Cancer Screening (HPV/Pap Cotest)  04/07/2021   Influenza Vaccine  11/06/2023   COVID-19 Vaccine (5 - 2025-26 season) 12/07/2023   Hepatitis C Screening  Completed   HIV Screening  Completed   Meningococcal B Vaccine  Aged Out    Discussed health benefits of physical activity, and encouraged her to engage in regular exercise appropriate for her age and condition.  Routine general medical examination at a health care facility  Prediabetes -     Comprehensive metabolic panel with GFR; Future -     Hemoglobin A1c; Future  Dyslipidemia (high LDL; low HDL) -     Lipid panel; Future  Vitamin D  deficiency -     VITAMIN D  25 Hydroxy (Vit-D Deficiency, Fractures); Future  Immunization due -     Tdap vaccine greater than or equal to 7yo IM   Normal physical exam findings. I counseled the patient on the recommended amount of exercise per CDC recommendation. I reviewed  preventative screening, immunizations, and medical history and updated in the chart, and appropriate labs and vaccinations were ordered. Handouts given on healthy eating and exercise.   No follow-ups on file.     Heron CHRISTELLA Sharper, MD

## 2023-12-16 ENCOUNTER — Encounter: Payer: Self-pay | Admitting: Family Medicine

## 2023-12-16 DIAGNOSIS — M25512 Pain in left shoulder: Secondary | ICD-10-CM | POA: Diagnosis not present

## 2023-12-16 DIAGNOSIS — E559 Vitamin D deficiency, unspecified: Secondary | ICD-10-CM

## 2023-12-16 DIAGNOSIS — E119 Type 2 diabetes mellitus without complications: Secondary | ICD-10-CM

## 2023-12-17 ENCOUNTER — Encounter: Admitting: Family Medicine

## 2023-12-17 ENCOUNTER — Other Ambulatory Visit (HOSPITAL_COMMUNITY): Payer: Self-pay

## 2023-12-17 DIAGNOSIS — M25512 Pain in left shoulder: Secondary | ICD-10-CM | POA: Diagnosis not present

## 2023-12-17 MED ORDER — TIRZEPATIDE 2.5 MG/0.5ML ~~LOC~~ SOAJ
2.5000 mg | SUBCUTANEOUS | 2 refills | Status: DC
  Filled 2023-12-17: qty 2, 28d supply, fill #0
  Filled 2024-01-18: qty 2, 28d supply, fill #1
  Filled 2024-02-08 – 2024-02-10 (×2): qty 2, 28d supply, fill #2

## 2023-12-17 MED ORDER — VITAMIN D (ERGOCALCIFEROL) 1.25 MG (50000 UNIT) PO CAPS
50000.0000 [IU] | ORAL_CAPSULE | ORAL | 0 refills | Status: AC
  Filled 2023-12-17: qty 12, 84d supply, fill #0

## 2023-12-22 DIAGNOSIS — M25512 Pain in left shoulder: Secondary | ICD-10-CM | POA: Diagnosis not present

## 2023-12-24 DIAGNOSIS — M25512 Pain in left shoulder: Secondary | ICD-10-CM | POA: Diagnosis not present

## 2023-12-29 DIAGNOSIS — M25512 Pain in left shoulder: Secondary | ICD-10-CM | POA: Diagnosis not present

## 2024-01-01 DIAGNOSIS — M25512 Pain in left shoulder: Secondary | ICD-10-CM | POA: Diagnosis not present

## 2024-01-04 DIAGNOSIS — M25512 Pain in left shoulder: Secondary | ICD-10-CM | POA: Diagnosis not present

## 2024-01-06 DIAGNOSIS — M25512 Pain in left shoulder: Secondary | ICD-10-CM | POA: Diagnosis not present

## 2024-01-07 ENCOUNTER — Ambulatory Visit (INDEPENDENT_AMBULATORY_CARE_PROVIDER_SITE_OTHER)
Admission: RE | Admit: 2024-01-07 | Discharge: 2024-01-07 | Disposition: A | Discharge status: Home / Self Care | Source: Ambulatory Visit | Attending: Family Medicine | Admitting: Family Medicine

## 2024-01-07 ENCOUNTER — Other Ambulatory Visit

## 2024-01-07 DIAGNOSIS — J9811 Atelectasis: Secondary | ICD-10-CM | POA: Diagnosis not present

## 2024-01-07 DIAGNOSIS — J129 Viral pneumonia, unspecified: Secondary | ICD-10-CM | POA: Diagnosis not present

## 2024-01-07 DIAGNOSIS — I10 Essential (primary) hypertension: Secondary | ICD-10-CM | POA: Diagnosis not present

## 2024-01-07 DIAGNOSIS — U071 COVID-19: Secondary | ICD-10-CM | POA: Diagnosis not present

## 2024-01-07 DIAGNOSIS — R918 Other nonspecific abnormal finding of lung field: Secondary | ICD-10-CM | POA: Diagnosis not present

## 2024-01-07 DIAGNOSIS — J189 Pneumonia, unspecified organism: Secondary | ICD-10-CM | POA: Diagnosis not present

## 2024-01-11 ENCOUNTER — Other Ambulatory Visit: Payer: Self-pay | Admitting: Gastroenterology

## 2024-01-11 DIAGNOSIS — M25512 Pain in left shoulder: Secondary | ICD-10-CM | POA: Diagnosis not present

## 2024-01-12 ENCOUNTER — Ambulatory Visit: Payer: Self-pay | Admitting: Family Medicine

## 2024-01-12 ENCOUNTER — Other Ambulatory Visit (HOSPITAL_COMMUNITY): Payer: Self-pay

## 2024-01-12 ENCOUNTER — Encounter (HOSPITAL_COMMUNITY): Payer: Self-pay

## 2024-01-14 DIAGNOSIS — M25512 Pain in left shoulder: Secondary | ICD-10-CM | POA: Diagnosis not present

## 2024-01-18 ENCOUNTER — Other Ambulatory Visit: Payer: Self-pay

## 2024-01-21 DIAGNOSIS — M25512 Pain in left shoulder: Secondary | ICD-10-CM | POA: Diagnosis not present

## 2024-02-03 ENCOUNTER — Encounter: Payer: Self-pay | Admitting: Family Medicine

## 2024-02-03 DIAGNOSIS — G43809 Other migraine, not intractable, without status migrainosus: Secondary | ICD-10-CM

## 2024-02-03 DIAGNOSIS — K219 Gastro-esophageal reflux disease without esophagitis: Secondary | ICD-10-CM

## 2024-02-03 DIAGNOSIS — R112 Nausea with vomiting, unspecified: Secondary | ICD-10-CM

## 2024-02-03 DIAGNOSIS — M25512 Pain in left shoulder: Secondary | ICD-10-CM | POA: Diagnosis not present

## 2024-02-04 ENCOUNTER — Other Ambulatory Visit (HOSPITAL_COMMUNITY): Payer: Self-pay

## 2024-02-04 MED ORDER — ONDANSETRON 4 MG PO TBDP
4.0000 mg | ORAL_TABLET | Freq: Three times a day (TID) | ORAL | 2 refills | Status: AC | PRN
  Filled 2024-02-04: qty 20, 7d supply, fill #0
  Filled 2024-04-18: qty 20, 7d supply, fill #1

## 2024-02-04 MED ORDER — PANTOPRAZOLE SODIUM 40 MG PO TBEC
40.0000 mg | DELAYED_RELEASE_TABLET | Freq: Every day | ORAL | 0 refills | Status: AC
  Filled 2024-02-04: qty 90, 90d supply, fill #0

## 2024-02-08 ENCOUNTER — Other Ambulatory Visit (HOSPITAL_COMMUNITY): Payer: Self-pay

## 2024-02-08 MED ORDER — TOPIRAMATE 50 MG PO TABS
150.0000 mg | ORAL_TABLET | Freq: Two times a day (BID) | ORAL | 1 refills | Status: DC
  Filled 2024-02-08: qty 360, 60d supply, fill #0

## 2024-02-09 ENCOUNTER — Other Ambulatory Visit (HOSPITAL_COMMUNITY): Payer: Self-pay

## 2024-02-09 DIAGNOSIS — M25512 Pain in left shoulder: Secondary | ICD-10-CM | POA: Diagnosis not present

## 2024-02-11 DIAGNOSIS — M25512 Pain in left shoulder: Secondary | ICD-10-CM | POA: Diagnosis not present

## 2024-02-16 DIAGNOSIS — M25512 Pain in left shoulder: Secondary | ICD-10-CM | POA: Diagnosis not present

## 2024-02-18 DIAGNOSIS — Z9889 Other specified postprocedural states: Secondary | ICD-10-CM | POA: Diagnosis not present

## 2024-03-15 ENCOUNTER — Other Ambulatory Visit (HOSPITAL_COMMUNITY): Payer: Self-pay

## 2024-03-15 ENCOUNTER — Other Ambulatory Visit: Payer: Self-pay | Admitting: Family Medicine

## 2024-03-15 DIAGNOSIS — E119 Type 2 diabetes mellitus without complications: Secondary | ICD-10-CM

## 2024-03-16 ENCOUNTER — Other Ambulatory Visit (HOSPITAL_COMMUNITY): Payer: Self-pay

## 2024-03-16 MED ORDER — MOUNJARO 2.5 MG/0.5ML ~~LOC~~ SOAJ
2.5000 mg | SUBCUTANEOUS | 2 refills | Status: DC
  Filled 2024-03-16: qty 2, 28d supply, fill #0

## 2024-03-17 ENCOUNTER — Other Ambulatory Visit (HOSPITAL_COMMUNITY): Payer: Self-pay

## 2024-03-17 ENCOUNTER — Other Ambulatory Visit: Payer: Self-pay

## 2024-03-17 ENCOUNTER — Ambulatory Visit: Admitting: Family Medicine

## 2024-03-17 VITALS — BP 90/60 | HR 77 | Temp 98.1°F | Ht 64.0 in | Wt 263.4 lb

## 2024-03-17 DIAGNOSIS — E538 Deficiency of other specified B group vitamins: Secondary | ICD-10-CM

## 2024-03-17 DIAGNOSIS — E559 Vitamin D deficiency, unspecified: Secondary | ICD-10-CM | POA: Diagnosis not present

## 2024-03-17 DIAGNOSIS — Z7985 Long-term (current) use of injectable non-insulin antidiabetic drugs: Secondary | ICD-10-CM | POA: Diagnosis not present

## 2024-03-17 DIAGNOSIS — E119 Type 2 diabetes mellitus without complications: Secondary | ICD-10-CM | POA: Diagnosis not present

## 2024-03-17 LAB — POCT GLYCOSYLATED HEMOGLOBIN (HGB A1C): Hemoglobin A1C: 5.4 % (ref 4.0–5.6)

## 2024-03-17 MED ORDER — TIRZEPATIDE 5 MG/0.5ML ~~LOC~~ SOAJ
5.0000 mg | SUBCUTANEOUS | 2 refills | Status: AC
  Filled 2024-03-17 – 2024-03-18 (×3): qty 2, 28d supply, fill #0
  Filled 2024-04-11: qty 2, 28d supply, fill #1

## 2024-03-17 NOTE — Progress Notes (Unsigned)
 Established Patient Office Visit  Subjective   Patient ID: Debra Parker, female    DOB: [birth year]  Age: 38 y.o. MRN: [id number]  Chief Complaint  Patient presents with   Medical Management of Chronic Issues    HPI   Current Outpatient Medications  Medication Instructions   acetaminophen  (TYLENOL ) 500 mg, Every 6 hours PRN   albuterol  (VENTOLIN  HFA) 108 (90 Base) MCG/ACT inhaler 2 puffs, Inhalation, Every 6 hours PRN   budesonide -formoterol  (SYMBICORT ) 160-4.5 MCG/ACT inhaler 2 puffs, Inhalation, 2 times daily   cyanocobalamin  (VITAMIN B12) 1,000 mcg, Intramuscular, Every 30 days   furosemide  (LASIX ) 10 mg, Oral, Daily PRN   guaiFENesin -dextromethorphan  (ROBITUSSIN DM) 100-10 MG/5ML syrup 15 mLs, Oral, Every 8 hours   levonorgestrel  (MIRENA ) 20 MCG/DAY IUD 1 each,  Once   meloxicam  (MOBIC ) 7.5 MG tablet Take 1 tablet (7.5 mg total) by mouth 2 (two) times daily for shoulder pain.   methocarbamol  (ROBAXIN ) 500 mg, Oral, 3 times daily PRN   metoCLOPramide  (REGLAN ) 10 mg, Oral, Every 6 hours PRN   ondansetron  (ZOFRAN -ODT) 4 mg, Oral, Every 8 hours PRN   pantoprazole  (PROTONIX ) 40 mg, Oral, Daily, Patient needs follow up appointment for future refills. Please call [phone] to schedule an appointment.   tirzepatide  (MOUNJARO ) 5 mg, Subcutaneous, Weekly   topiramate  (TOPAMAX ) 150 mg, Oral, 2 times daily   Vitamin D  (Ergocalciferol ) (DRISDOL ) 50,000 Units, Oral, Every 7 days    Patient Active Problem List   Diagnosis Date Noted   Nausea and vomiting 11/23/2023   COVID-19 virus infection 11/22/2023   Hyponatremia 11/22/2023   Acute shoulder pain 08/31/2023   BMI 45.0-49.9, adult (HCC) 07/08/2023   SOBOE (shortness of breath on exertion) 06/01/2023   Dyslipidemia (high LDL; low HDL) 03/28/2023   Other specified eating disorder 03/18/2023   Chronic bronchitis (HCC) 10/01/2022   Vitamin D  deficiency 12/31/2021   Prediabetes 12/25/2021   OSA  (obstructive sleep apnea) 12/25/2021   Lichen planopilaris 02/19/2020   Migraine 02/17/2020   Morbid obesity (HCC) 02/01/2018     Review of Systems  All other systems reviewed and are negative.     Objective:     BP 90/60 (BP Location: Right Arm, Patient Position: Sitting, Cuff Size: Large)   Pulse 77   Temp 98.1 F (36.7 C) (Oral)   Ht 5' 4 (1.626 m)   Wt 263 lb 6.4 oz (119.5 kg)   SpO2 98%   BMI 45.21 kg/m  {Vitals History (Optional):23777}  Physical Exam Vitals reviewed.      Results for orders placed or performed in visit on 03/17/24  POC HgB A1c  Result Value Ref Range   Hemoglobin A1C 5.4 4.0 - 5.6 %   HbA1c POC (<> result, manual entry)     HbA1c, POC (prediabetic range)     HbA1c, POC (controlled diabetic range)      {Labs (Optional):23779}  The 10-year ASCVD risk score (Arnett DK, et al., 2019) is: 0.3%    Assessment & Plan:  Diabetes mellitus without complication (HCC) -     POCT glycosylated hemoglobin (Hb A1C) -     Microalbumin / creatinine urine ratio -     Tirzepatide ; Inject 5 mg into the skin once a week.  Dispense: 2 mL; Refill: 2  Vitamin D  deficiency -     VITAMIN D  25 Hydroxy (Vit-D Deficiency, Fractures); Future  Vitamin B12 deficiency -     Vitamin B12; Future     Return in about 6 months (  around 09/15/2024).    Heron CHRISTELLA Sharper, MD

## 2024-03-18 ENCOUNTER — Other Ambulatory Visit: Payer: Self-pay

## 2024-03-18 ENCOUNTER — Other Ambulatory Visit (HOSPITAL_COMMUNITY): Payer: Self-pay

## 2024-03-22 NOTE — Progress Notes (Unsigned)
 NEUROLOGY CONSULTATION NOTE  Debra Parker MRN: [id number] DOB: April 18, 1985  Referring provider: Heron Sharper, MD Primary care provider: Heron Sharper, MD  Reason for consult:  migraines  Assessment/Plan:   Migraine without aura, without status migrainosus, not intractable  Migraine prevention:  Plan to start Qulipta  60mg  daily.  Continue topiramate  for now with plan to taper off. Migraine rescue:  rizatriptan -MLT 10mg ; zofran  ODT 4mg .  She will try samples of Nurtec as well.  Lifestyle modification: Limit use of pain relievers to no more than 9 days out of the month to prevent risk of rebound or medication-overuse headache. Diet modification/hydration/caffeine cessation Routine exercise Sleep hygiene Consider vitamins/supplements:  magnesium citrate 400mg  daily, riboflavin 400mg  daily, CoQ10 100mg  three times daily Keep headache diary Follow up 6 months.    Subjective:  Debra Parker is a 38 year old right-handed female with OSA and history of kidney stones who presents for migraines.  History supplemented by referring provider's note.  Onset:  51-59 year old Location:  bilateral frontal/temporal Quality:  pressure, throbbing Intensity:  6-7/10.  Aura:  absent Prodrome:  absent Associated symptoms:  nausea, occasional vomiting, photophobia, phonophobia, occasional osmophobia, blurred vision, .  She denies associated neck pain, unilateral numbness or weakness. Duration:  3 to 4 days Frequency:  3 to 4 times a month Frequency of abortive medication: rare Triggers:  caffeine, seafood Relieving factors:  no Activity:  aggravates  Past NSAIDS/analgesics:  naproxen , diclofenac , ibuprofen  Past abortive triptans:  sumatriptan  100mg  Past abortive ergotamine:  none Past muscle relaxants:  cyclobenzaprine , methocarbamol  500mg , Skelaxin  Past anti-emetic:  promethazine , metoclopramide  Past antihypertensive medications:  none Past antidepressant medications:   amitriptyline  Past anticonvulsant medications:  none Past anti-CGRP:  Aimovig  (injection site reaction) Past vitamins/Herbal/Supplements:  none Past antihistamines/decongestants:  none Other past therapies:  absent  Current NSAIDS/analgesics:  acetaminophen , meloxicam  Current triptans:  none Current ergotamine:  none Current anti-emetic:  ondansetron -ODT 4mg  Current muscle relaxants:  none Current Antihypertensive medications:  furosemide  10mg  daily PRN Current Antidepressant medications:  none Current Anticonvulsant medications:  topiramate  150mg  twice daily Current anti-CGRP:  none Current Vitamins/Herbal/Supplements:  none Current Antihistamines/Decongestants:  none Other therapy:  none Birth control:  Mirnea Other medications:  Mounjaro    Caffeine:  none Diet:  Trying to increase water intake (at least 3 to 4 bottles daily).  Poor appetite on Mounjaro .  Plans to start protein shake Exercise:  she was going to the gym but then injured her shoulder.  Needs to return. Depression:  no; Anxiety:  no Sleep hygiene:  Has OSA.  Cannot tolerate CPAP.  6 hours of sleep a night.  Rested in the morning.    History of TBI/concussion:  no Family history of headache:  no Family history of cerebral aneurysm:  maternal aunt      PAST MEDICAL HISTORY: Past Medical History:  Diagnosis Date   Allergy    Bronchitis    Diverticulitis 2019   GERD (gastroesophageal reflux disease)    History of kidney stones    Hypertension    when pregnant   Kidney stone    Lichen planopilaris    Migraines    Obesity    OSA (obstructive sleep apnea)    Pneumonia    Pre-diabetes    Sleep apnea    no CPAP   Vitamin D  deficiency     PAST SURGICAL HISTORY: Past Surgical History:  Procedure Laterality Date   CESAREAN SECTION  2007   fatty pocket removed  1999  Stomach   SHOULDER ARTHROSCOPY Left 10/16/2023   Procedure: ARTHROSCOPY, SHOULDER;  Surgeon: Sharl Selinda Dover, MD;  Location: [facility];  Service: Orthopedics;  Laterality: Left;   WISDOM TOOTH EXTRACTION  2000    MEDICATIONS: Medications Ordered Prior to Encounter[1]  ALLERGIES: Allergies[2]  FAMILY HISTORY: Family History  Problem Relation Age of Onset   Hyperlipidemia Mother    Diabetes Mother    Liver disease Mother    Obesity Mother    Obesity Father    Cancer Father    Hypertension Father    Diabetes Father    Hyperlipidemia Father    Early death Sister        stillborn   Heart disease Brother    Heart attack Brother 82   Arthritis Maternal Grandmother    Diabetes Maternal Grandmother    Heart attack Maternal Grandmother    Throat cancer Maternal Grandfather    Diabetes Paternal Grandfather    Stroke Paternal Grandfather    Kidney disease Paternal Grandfather    Stomach cancer Paternal Uncle    Kidney disease Paternal Uncle    Colon cancer Neg Hx    Rectal cancer Neg Hx    Esophageal cancer Neg Hx     Objective:  Blood pressure 125/85, pulse 77, height 5' 6 (1.676 m), weight 262 lb (118.8 kg), SpO2 100%. General: No acute distress.  Patient appears well-groomed.   Head:  Normocephalic/atraumatic Eyes:  fundi examined but not visualized Neck: supple, no paraspinal tenderness, full range of motion Heart: regular rate and rhythm Neurological Exam: Mental status: alert and oriented to person, place, and time, speech fluent and not dysarthric, language intact. Cranial nerves: CN I: not tested CN II: pupils equal, round and reactive to light, visual fields intact CN III, IV, VI:  full range of motion, no nystagmus, no ptosis CN V: facial sensation intact. CN VII: upper and lower face symmetric CN VIII: hearing intact CN IX, X: gag intact, uvula midline CN XI: sternocleidomastoid and trapezius muscles intact CN XII: tongue midline Bulk & Tone: normal, no fasciculations. Motor:  muscle strength 5/5 throughout Sensation:  Pinprick and vibratory sensation intact. Deep Tendon Reflexes:   2+ throughout,  toes downgoing.   Finger to nose testing:  Without dysmetria.   Gait:  Normal station and stride.  Romberg negative.    Thank you for allowing me to take part in the care of this patient.  Juliene Dunnings, DO  CC: Heron Sharper, MD        [1]  Current Outpatient Medications on File Prior to Visit  Medication Sig Dispense Refill   acetaminophen  (TYLENOL ) 500 MG tablet Take 500 mg by mouth every 6 (six) hours as needed for mild pain.     albuterol  (VENTOLIN  HFA) 108 (90 Base) MCG/ACT inhaler Inhale 2 puffs into the lungs every 6 (six) hours as needed for wheezing or shortness of breath. 8 g 0   budesonide -formoterol  (SYMBICORT ) 160-4.5 MCG/ACT inhaler Inhale 2 puffs into the lungs 2 (two) times daily. 10.2 g 3   cyanocobalamin  (VITAMIN B12) 1000 MCG/ML injection Inject 1 mL (1,000 mcg total) into the muscle every 30 (thirty) days. 1 mL 5   furosemide  (LASIX ) 20 MG tablet Take 0.5 tablets (10 mg total) by mouth daily as needed. 30 tablet 0   guaiFENesin -dextromethorphan  (ROBITUSSIN DM) 100-10 MG/5ML syrup Take 15 mLs by mouth every 8 (eight) hours. 118 mL 0   levonorgestrel  (MIRENA ) 20 MCG/DAY IUD 1 each by Intrauterine route once.  meloxicam  (MOBIC ) 7.5 MG tablet Take 1 tablet (7.5 mg total) by mouth 2 (two) times daily for shoulder pain. 30 tablet 0   methocarbamol  (ROBAXIN ) 500 MG tablet Take 1 tablet (500 mg total) by mouth 3 (three) times daily as needed. 40 tablet 1   metoCLOPramide  (REGLAN ) 10 MG tablet Take 1 tablet (10 mg total) by mouth every 6 (six) hours as needed for vomiting or nausea.     ondansetron  (ZOFRAN -ODT) 4 MG disintegrating tablet Take 1 tablet (4 mg total) by mouth every 8 (eight) hours as needed. 20 tablet 2   pantoprazole  (PROTONIX ) 40 MG tablet Take 1 tablet (40 mg total) by mouth daily. Patient needs follow up appointment for future refills. Please call [phone] to schedule an appointment. 90 tablet 0   tirzepatide  (MOUNJARO ) 5 MG/0.5ML  Pen Inject 5 mg into the skin once a week. 2 mL 2   topiramate  (TOPAMAX ) 50 MG tablet Take 3 tablets (150 mg total) by mouth 2 (two) times daily. 360 tablet 1   Vitamin D , Ergocalciferol , (DRISDOL ) 1.25 MG (50000 UNIT) CAPS capsule Take 1 capsule (50,000 Units total) by mouth every 7 (seven) days. 12 capsule 0   No current facility-administered medications on file prior to visit.  [2]  Allergies Allergen Reactions   Aimovig  [Erenumab -Aooe] Hives and Rash   Sulfa Antibiotics Rash

## 2024-03-23 ENCOUNTER — Other Ambulatory Visit: Payer: Self-pay

## 2024-03-23 ENCOUNTER — Telehealth: Payer: Self-pay

## 2024-03-23 ENCOUNTER — Encounter: Payer: Self-pay | Admitting: Neurology

## 2024-03-23 ENCOUNTER — Ambulatory Visit: Admitting: Neurology

## 2024-03-23 ENCOUNTER — Other Ambulatory Visit (HOSPITAL_COMMUNITY): Payer: Self-pay

## 2024-03-23 VITALS — BP 125/85 | HR 77 | Ht 66.0 in | Wt 262.0 lb

## 2024-03-23 DIAGNOSIS — G43009 Migraine without aura, not intractable, without status migrainosus: Secondary | ICD-10-CM

## 2024-03-23 MED ORDER — RIZATRIPTAN BENZOATE 10 MG PO TBDP
10.0000 mg | ORAL_TABLET | ORAL | 5 refills | Status: AC | PRN
  Filled 2024-03-23: qty 10, 30d supply, fill #0
  Filled 2024-03-23: qty 10, fill #0

## 2024-03-23 MED ORDER — QULIPTA 60 MG PO TABS
60.0000 mg | ORAL_TABLET | Freq: Every day | ORAL | 11 refills | Status: DC
  Filled 2024-03-23 (×2): qty 30, 30d supply, fill #0
  Filled 2024-04-18: qty 30, 30d supply, fill #1

## 2024-03-23 NOTE — Patient Instructions (Signed)
°  Start QULIPTA  60MG  DAILY.  Contact us  in 3 months with update and we can increase dose if needed. Take RIZATRIPTAN  at earliest onset of headache.  May repeat dose once in 2 hours if needed.  Maximum 2 tablets in 24 hours. Take ONDANSETRON  for nausea Limit use of pain relievers to no more than 9 days out of the month.  These medications include acetaminophen , NSAIDs (ibuprofen /Advil /Motrin , naproxen /Aleve , triptans (Imitrex /sumatriptan ), Excedrin, and narcotics.  This will help reduce risk of rebound headaches. Be aware of common food triggers Routine exercise Stay adequately hydrated (aim for 64 oz water daily) Keep headache diary Maintain proper stress management Maintain proper sleep hygiene Do not skip meals Consider supplements:  magnesium citrate 400mg  daily, riboflavin 400mg  daily, coenzyme Q10 100mg  three times daily.

## 2024-03-23 NOTE — Telephone Encounter (Signed)
 Patient see in office today. Patient is too start Qulipta  60 mg.   Pa team please start a Pa for Qulipta  60 mg.

## 2024-03-24 ENCOUNTER — Other Ambulatory Visit: Payer: Self-pay

## 2024-03-24 ENCOUNTER — Other Ambulatory Visit (HOSPITAL_COMMUNITY): Payer: Self-pay

## 2024-03-25 ENCOUNTER — Other Ambulatory Visit (HOSPITAL_COMMUNITY): Payer: Self-pay

## 2024-03-25 ENCOUNTER — Telehealth: Payer: Self-pay | Admitting: Pharmacy Technician

## 2024-03-25 NOTE — Telephone Encounter (Signed)
 PA has been submitted, and telephone encounter has been created. Please see telephone encounter dated 12.19.25.

## 2024-03-25 NOTE — Telephone Encounter (Signed)
 Pharmacy Patient Advocate Encounter  Received notification from [employer] that Prior Authorization for QULIPTA  60MG  has been APPROVED from 12.19.25 to 6.18.26. Ran test claim, Copay is $0. This test claim was processed through [facility]- copay amounts may vary at other pharmacies due to pharmacy/plan contracts, or as the patient moves through the different stages of their insurance plan.   PA #/Case ID/Reference #: [id number]

## 2024-03-25 NOTE — Telephone Encounter (Signed)
 Pharmacy Patient Advocate Encounter   Received notification from Physician's Office that prior authorization for QULIPTA  60MG  is required/requested.   Insurance verification completed.   The patient is insured through [employer].   Per test claim: PA required; PA submitted to above mentioned insurance via Latent Key/confirmation #/EOC [id number] Status is pending

## 2024-04-11 ENCOUNTER — Other Ambulatory Visit (HOSPITAL_COMMUNITY): Payer: Self-pay

## 2024-04-11 ENCOUNTER — Other Ambulatory Visit: Payer: Self-pay

## 2024-04-11 DIAGNOSIS — G43809 Other migraine, not intractable, without status migrainosus: Secondary | ICD-10-CM

## 2024-04-11 MED ORDER — TOPIRAMATE 50 MG PO TABS
150.0000 mg | ORAL_TABLET | Freq: Two times a day (BID) | ORAL | 0 refills | Status: AC
  Filled 2024-04-11 (×3): qty 540, 90d supply, fill #0

## 2024-04-11 NOTE — Telephone Encounter (Signed)
 Patient needed refills for Topiramate  150 mg BID.

## 2024-04-12 ENCOUNTER — Other Ambulatory Visit: Payer: Self-pay

## 2024-04-14 ENCOUNTER — Other Ambulatory Visit

## 2024-04-18 ENCOUNTER — Other Ambulatory Visit: Payer: Self-pay

## 2024-05-10 ENCOUNTER — Other Ambulatory Visit (HOSPITAL_COMMUNITY): Payer: Self-pay

## 2024-05-10 ENCOUNTER — Other Ambulatory Visit: Payer: Self-pay

## 2024-05-10 MED ORDER — QULIPTA 60 MG PO TABS
60.0000 mg | ORAL_TABLET | Freq: Every day | ORAL | 3 refills | Status: AC
  Filled 2024-05-10: qty 90, 90d supply, fill #0

## 2024-09-15 ENCOUNTER — Ambulatory Visit: Admitting: Family Medicine

## 2024-09-22 ENCOUNTER — Ambulatory Visit: Payer: Self-pay | Admitting: Neurology
